# Patient Record
Sex: Female | Born: 1970
Health system: Southern US, Community
[De-identification: ages and names within clinical notes are randomized; demographics above are authoritative.]

## PROBLEM LIST (undated history)

## (undated) DIAGNOSIS — T4145XA Adverse effect of unspecified anesthetic, initial encounter: Secondary | ICD-10-CM

## (undated) DIAGNOSIS — T8859XA Other complications of anesthesia, initial encounter: Secondary | ICD-10-CM

## (undated) DIAGNOSIS — E7800 Pure hypercholesterolemia, unspecified: Secondary | ICD-10-CM

## (undated) DIAGNOSIS — G8929 Other chronic pain: Secondary | ICD-10-CM

## (undated) DIAGNOSIS — E039 Hypothyroidism, unspecified: Secondary | ICD-10-CM

## (undated) DIAGNOSIS — K219 Gastro-esophageal reflux disease without esophagitis: Secondary | ICD-10-CM

## (undated) DIAGNOSIS — N200 Calculus of kidney: Secondary | ICD-10-CM

## (undated) DIAGNOSIS — I509 Heart failure, unspecified: Secondary | ICD-10-CM

## (undated) DIAGNOSIS — G9050 Complex regional pain syndrome I, unspecified: Secondary | ICD-10-CM

## (undated) DIAGNOSIS — I639 Cerebral infarction, unspecified: Secondary | ICD-10-CM

## (undated) DIAGNOSIS — M810 Age-related osteoporosis without current pathological fracture: Secondary | ICD-10-CM

## (undated) DIAGNOSIS — M549 Dorsalgia, unspecified: Secondary | ICD-10-CM

## (undated) DIAGNOSIS — M542 Cervicalgia: Secondary | ICD-10-CM

## (undated) DIAGNOSIS — G2401 Drug induced subacute dyskinesia: Secondary | ICD-10-CM

## (undated) DIAGNOSIS — F419 Anxiety disorder, unspecified: Secondary | ICD-10-CM

## (undated) DIAGNOSIS — R51 Headache: Secondary | ICD-10-CM

## (undated) DIAGNOSIS — E079 Disorder of thyroid, unspecified: Secondary | ICD-10-CM

## (undated) DIAGNOSIS — R519 Headache, unspecified: Secondary | ICD-10-CM

## (undated) HISTORY — PX: SALPINGOOPHORECTOMY: SHX82

## (undated) HISTORY — DX: Age-related osteoporosis without current pathological fracture: M81.0

## (undated) HISTORY — PX: CHOLECYSTECTOMY: SHX55

## (undated) HISTORY — PX: ABDOMINAL HYSTERECTOMY: SHX81

## (undated) HISTORY — PX: BACK SURGERY: SHX140

## (undated) HISTORY — DX: Calculus of kidney: N20.0

## (undated) HISTORY — PX: STRABISMUS SURGERY: SHX218

## (undated) HISTORY — PX: KNEE ARTHROSCOPY: SUR90

## (undated) HISTORY — PX: CYST EXCISION: SHX5701

## (undated) HISTORY — DX: Drug induced subacute dyskinesia: G24.01

## (undated) HISTORY — PX: APPENDECTOMY: SHX54

## (undated) HISTORY — PX: NECK SURGERY: SHX720

## (undated) HISTORY — PX: BREAST SURGERY: SHX581

---
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
## 1999-02-13 ENCOUNTER — Ambulatory Visit (HOSPITAL_COMMUNITY): Admission: RE | Admit: 1999-02-13 | Discharge: 1999-02-13 | Payer: Self-pay | Admitting: Psychiatry

## 1999-02-16 ENCOUNTER — Ambulatory Visit: Admission: RE | Admit: 1999-02-16 | Discharge: 1999-02-16 | Payer: Self-pay | Admitting: Anesthesiology

## 2000-05-08 ENCOUNTER — Emergency Department (HOSPITAL_COMMUNITY): Admission: EM | Admit: 2000-05-08 | Discharge: 2000-05-08 | Payer: Self-pay | Admitting: Emergency Medicine

## 2000-07-07 ENCOUNTER — Encounter: Payer: Self-pay | Admitting: Family Medicine

## 2000-07-07 ENCOUNTER — Ambulatory Visit (HOSPITAL_COMMUNITY): Admission: RE | Admit: 2000-07-07 | Discharge: 2000-07-07 | Payer: Self-pay | Admitting: Family Medicine

## 2000-11-20 ENCOUNTER — Ambulatory Visit (HOSPITAL_COMMUNITY): Admission: RE | Admit: 2000-11-20 | Discharge: 2000-11-20 | Payer: Self-pay | Admitting: Family Medicine

## 2000-11-20 ENCOUNTER — Encounter: Payer: Self-pay | Admitting: Family Medicine

## 2001-09-27 ENCOUNTER — Emergency Department (HOSPITAL_COMMUNITY): Admission: EM | Admit: 2001-09-27 | Discharge: 2001-09-27 | Payer: Self-pay | Admitting: Emergency Medicine

## 2002-01-11 ENCOUNTER — Observation Stay (HOSPITAL_COMMUNITY): Admission: AD | Admit: 2002-01-11 | Discharge: 2002-01-12 | Payer: Self-pay | Admitting: Family Medicine

## 2002-01-11 ENCOUNTER — Encounter: Payer: Self-pay | Admitting: Family Medicine

## 2002-04-05 ENCOUNTER — Ambulatory Visit (HOSPITAL_COMMUNITY): Admission: RE | Admit: 2002-04-05 | Discharge: 2002-04-05 | Payer: Self-pay | Admitting: Family Medicine

## 2002-04-05 ENCOUNTER — Encounter: Payer: Self-pay | Admitting: Family Medicine

## 2002-06-20 ENCOUNTER — Encounter: Payer: Self-pay | Admitting: Internal Medicine

## 2002-06-20 ENCOUNTER — Emergency Department (HOSPITAL_COMMUNITY): Admission: EM | Admit: 2002-06-20 | Discharge: 2002-06-20 | Payer: Self-pay | Admitting: Internal Medicine

## 2002-06-22 ENCOUNTER — Encounter: Payer: Self-pay | Admitting: Internal Medicine

## 2002-06-22 ENCOUNTER — Ambulatory Visit (HOSPITAL_COMMUNITY): Admission: RE | Admit: 2002-06-22 | Discharge: 2002-06-22 | Payer: Self-pay | Admitting: Internal Medicine

## 2002-09-24 ENCOUNTER — Encounter: Admission: RE | Admit: 2002-09-24 | Discharge: 2002-09-24 | Payer: Self-pay | Admitting: Neurology

## 2002-09-24 ENCOUNTER — Encounter: Payer: Self-pay | Admitting: Neurology

## 2002-11-26 ENCOUNTER — Encounter: Payer: Self-pay | Admitting: Emergency Medicine

## 2002-11-26 ENCOUNTER — Emergency Department (HOSPITAL_COMMUNITY): Admission: EM | Admit: 2002-11-26 | Discharge: 2002-11-26 | Payer: Self-pay | Admitting: Emergency Medicine

## 2002-12-02 ENCOUNTER — Encounter: Payer: Self-pay | Admitting: Emergency Medicine

## 2002-12-02 ENCOUNTER — Emergency Department (HOSPITAL_COMMUNITY): Admission: EM | Admit: 2002-12-02 | Discharge: 2002-12-02 | Payer: Self-pay | Admitting: Emergency Medicine

## 2002-12-24 ENCOUNTER — Encounter: Payer: Self-pay | Admitting: Emergency Medicine

## 2002-12-24 ENCOUNTER — Emergency Department (HOSPITAL_COMMUNITY): Admission: EM | Admit: 2002-12-24 | Discharge: 2002-12-24 | Payer: Self-pay | Admitting: Emergency Medicine

## 2003-01-16 ENCOUNTER — Emergency Department (HOSPITAL_COMMUNITY): Admission: AD | Admit: 2003-01-16 | Discharge: 2003-01-16 | Payer: Self-pay | Admitting: Family Medicine

## 2003-05-24 ENCOUNTER — Ambulatory Visit (HOSPITAL_COMMUNITY): Admission: RE | Admit: 2003-05-24 | Discharge: 2003-05-24 | Payer: Self-pay | Admitting: Family Medicine

## 2003-07-31 ENCOUNTER — Emergency Department (HOSPITAL_COMMUNITY): Admission: EM | Admit: 2003-07-31 | Discharge: 2003-07-31 | Payer: Self-pay | Admitting: Emergency Medicine

## 2003-10-12 ENCOUNTER — Ambulatory Visit (HOSPITAL_COMMUNITY): Admission: RE | Admit: 2003-10-12 | Discharge: 2003-10-12 | Payer: Self-pay | Admitting: Family Medicine

## 2003-10-23 ENCOUNTER — Emergency Department (HOSPITAL_COMMUNITY): Admission: EM | Admit: 2003-10-23 | Discharge: 2003-10-23 | Payer: Self-pay | Admitting: Emergency Medicine

## 2003-12-20 ENCOUNTER — Other Ambulatory Visit: Admission: RE | Admit: 2003-12-20 | Discharge: 2003-12-20 | Payer: Self-pay | Admitting: Dermatology

## 2004-01-09 ENCOUNTER — Emergency Department (HOSPITAL_COMMUNITY): Admission: EM | Admit: 2004-01-09 | Discharge: 2004-01-10 | Payer: Self-pay | Admitting: *Deleted

## 2005-01-06 ENCOUNTER — Emergency Department (HOSPITAL_COMMUNITY): Admission: EM | Admit: 2005-01-06 | Discharge: 2005-01-06 | Payer: Self-pay | Admitting: Emergency Medicine

## 2005-04-14 ENCOUNTER — Emergency Department (HOSPITAL_COMMUNITY): Admission: EM | Admit: 2005-04-14 | Discharge: 2005-04-15 | Payer: Self-pay | Admitting: Emergency Medicine

## 2005-04-29 ENCOUNTER — Ambulatory Visit (HOSPITAL_COMMUNITY): Admission: RE | Admit: 2005-04-29 | Discharge: 2005-04-29 | Payer: Self-pay | Admitting: Family Medicine

## 2005-05-01 ENCOUNTER — Encounter (HOSPITAL_COMMUNITY): Admission: RE | Admit: 2005-05-01 | Discharge: 2005-05-31 | Payer: Self-pay | Admitting: Family Medicine

## 2005-05-17 ENCOUNTER — Encounter (INDEPENDENT_AMBULATORY_CARE_PROVIDER_SITE_OTHER): Payer: Self-pay | Admitting: General Surgery

## 2005-05-17 ENCOUNTER — Ambulatory Visit (HOSPITAL_COMMUNITY): Admission: RE | Admit: 2005-05-17 | Discharge: 2005-05-17 | Payer: Self-pay | Admitting: General Surgery

## 2005-10-11 ENCOUNTER — Ambulatory Visit (HOSPITAL_COMMUNITY): Admission: RE | Admit: 2005-10-11 | Discharge: 2005-10-11 | Payer: Self-pay | Admitting: Family Medicine

## 2006-02-26 ENCOUNTER — Ambulatory Visit (HOSPITAL_COMMUNITY): Admission: RE | Admit: 2006-02-26 | Discharge: 2006-02-26 | Payer: Self-pay | Admitting: Family Medicine

## 2006-06-24 ENCOUNTER — Encounter: Admission: RE | Admit: 2006-06-24 | Discharge: 2006-06-24 | Payer: Self-pay | Admitting: Orthopedic Surgery

## 2006-08-28 ENCOUNTER — Ambulatory Visit (HOSPITAL_COMMUNITY)
Admission: RE | Admit: 2006-08-28 | Discharge: 2006-08-28 | Payer: Self-pay | Admitting: Physical Medicine and Rehabilitation

## 2006-09-09 ENCOUNTER — Emergency Department (HOSPITAL_COMMUNITY): Admission: EM | Admit: 2006-09-09 | Discharge: 2006-09-09 | Payer: Self-pay | Admitting: Emergency Medicine

## 2006-09-10 ENCOUNTER — Ambulatory Visit (HOSPITAL_COMMUNITY): Admission: RE | Admit: 2006-09-10 | Discharge: 2006-09-10 | Payer: Self-pay | Admitting: Family Medicine

## 2006-09-22 ENCOUNTER — Ambulatory Visit (HOSPITAL_COMMUNITY): Admission: RE | Admit: 2006-09-22 | Discharge: 2006-09-22 | Payer: Self-pay | Admitting: Family Medicine

## 2006-11-12 ENCOUNTER — Emergency Department (HOSPITAL_COMMUNITY): Admission: EM | Admit: 2006-11-12 | Discharge: 2006-11-12 | Payer: Self-pay | Admitting: Emergency Medicine

## 2008-01-21 ENCOUNTER — Ambulatory Visit (HOSPITAL_COMMUNITY): Admission: RE | Admit: 2008-01-21 | Discharge: 2008-01-21 | Payer: Self-pay | Admitting: Family Medicine

## 2008-07-28 ENCOUNTER — Ambulatory Visit (HOSPITAL_COMMUNITY): Admission: RE | Admit: 2008-07-28 | Discharge: 2008-07-28 | Payer: Self-pay | Admitting: Neurosurgery

## 2008-08-23 ENCOUNTER — Emergency Department (HOSPITAL_COMMUNITY): Admission: EM | Admit: 2008-08-23 | Discharge: 2008-08-23 | Payer: Self-pay | Admitting: Emergency Medicine

## 2009-03-13 ENCOUNTER — Encounter: Admission: RE | Admit: 2009-03-13 | Discharge: 2009-03-13 | Payer: Self-pay | Admitting: Neurosurgery

## 2009-04-29 ENCOUNTER — Encounter: Admission: RE | Admit: 2009-04-29 | Discharge: 2009-04-29 | Payer: Self-pay | Admitting: Neurosurgery

## 2009-05-08 ENCOUNTER — Encounter (HOSPITAL_COMMUNITY): Admission: RE | Admit: 2009-05-08 | Discharge: 2009-06-07 | Payer: Self-pay | Admitting: Family Medicine

## 2009-05-23 ENCOUNTER — Encounter: Admission: RE | Admit: 2009-05-23 | Discharge: 2009-05-23 | Payer: Self-pay | Admitting: Neurosurgery

## 2009-07-03 ENCOUNTER — Ambulatory Visit: Payer: Self-pay | Admitting: Vascular Surgery

## 2009-07-03 ENCOUNTER — Inpatient Hospital Stay (HOSPITAL_COMMUNITY): Admission: RE | Admit: 2009-07-03 | Discharge: 2009-07-06 | Payer: Self-pay | Admitting: Neurosurgery

## 2010-02-19 ENCOUNTER — Ambulatory Visit (HOSPITAL_COMMUNITY): Admission: RE | Admit: 2010-02-19 | Discharge: 2010-02-19 | Payer: Self-pay | Admitting: Internal Medicine

## 2010-03-29 ENCOUNTER — Ambulatory Visit (HOSPITAL_COMMUNITY)
Admission: RE | Admit: 2010-03-29 | Discharge: 2010-03-29 | Payer: Self-pay | Source: Home / Self Care | Attending: Endocrinology | Admitting: Endocrinology

## 2010-06-08 ENCOUNTER — Other Ambulatory Visit (HOSPITAL_COMMUNITY): Payer: Self-pay | Admitting: Internal Medicine

## 2010-06-08 DIAGNOSIS — R1110 Vomiting, unspecified: Secondary | ICD-10-CM

## 2010-06-13 ENCOUNTER — Ambulatory Visit (HOSPITAL_COMMUNITY): Payer: Medicare Other

## 2010-06-19 ENCOUNTER — Ambulatory Visit (HOSPITAL_COMMUNITY): Payer: Medicare Other | Attending: Internal Medicine

## 2010-06-20 LAB — CBC
HCT: 31.6 % — ABNORMAL LOW (ref 36.0–46.0)
HCT: 32.4 % — ABNORMAL LOW (ref 36.0–46.0)
Hemoglobin: 11 g/dL — ABNORMAL LOW (ref 12.0–15.0)
MCHC: 33.8 g/dL (ref 30.0–36.0)
MCHC: 34.1 g/dL (ref 30.0–36.0)
Platelets: 206 10*3/uL (ref 150–400)
Platelets: 212 10*3/uL (ref 150–400)
WBC: 13.6 10*3/uL — ABNORMAL HIGH (ref 4.0–10.5)

## 2010-06-20 LAB — BASIC METABOLIC PANEL
BUN: 1 mg/dL — ABNORMAL LOW (ref 6–23)
CO2: 26 mEq/L (ref 19–32)
Calcium: 7.4 mg/dL — ABNORMAL LOW (ref 8.4–10.5)
Calcium: 7.6 mg/dL — ABNORMAL LOW (ref 8.4–10.5)
Chloride: 103 mEq/L (ref 96–112)
Creatinine, Ser: 0.64 mg/dL (ref 0.4–1.2)
GFR calc non Af Amer: >60 mL/min (ref >60)
GFR calc non Af Amer: >60 mL/min (ref >60)
Potassium: 3.8 mEq/L (ref 3.5–5.1)

## 2010-06-22 LAB — CBC
HCT: 40 % (ref 36.0–46.0)
WBC: 11.1 10*3/uL — ABNORMAL HIGH (ref 4.0–10.5)

## 2010-06-22 LAB — TYPE AND SCREEN

## 2010-06-22 LAB — SURGICAL PCR SCREEN: Staphylococcus aureus: POSITIVE — AB

## 2010-07-10 LAB — BASIC METABOLIC PANEL
BUN: 7 mg/dL (ref 6–23)
Calcium: 9.3 mg/dL (ref 8.4–10.5)
Creatinine, Ser: 0.73 mg/dL (ref 0.4–1.2)
GFR calc Af Amer: >60 mL/min (ref >60)
GFR calc non Af Amer: >60 mL/min (ref >60)
Glucose, Bld: 118 mg/dL — ABNORMAL HIGH (ref 70–99)
Sodium: 140 mEq/L (ref 135–145)

## 2010-07-10 LAB — DIFFERENTIAL
Basophils Relative: 1 % (ref 0–1)
Eosinophils Absolute: 0.2 10*3/uL (ref 0.0–0.7)
Eosinophils Relative: 2 % (ref 0–5)
Lymphs Abs: 4 10*3/uL (ref 0.7–4.0)
Neutrophils Relative %: 55 % (ref 43–77)

## 2010-07-10 LAB — CBC
Platelets: 235 10*3/uL (ref 150–400)
RBC: 4.3 MIL/uL (ref 3.87–5.11)

## 2010-07-11 LAB — CBC
MCV: 94.8 fL (ref 78.0–100.0)
Platelets: 253 10*3/uL (ref 150–400)
WBC: 11.6 10*3/uL — ABNORMAL HIGH (ref 4.0–10.5)

## 2010-08-14 NOTE — Op Note (Signed)
NAMEESRA, [name]             ACCOUNT NO.:  [id number]   MEDICAL RECORD NO.:  [id number]          PATIENT TYPE:  OIB   LOCATION:  3537                         FACILITY:  [facility]   PHYSICIAN:  Cristi Loron, M.D.DATE OF BIRTH:  Apr 30, 1970   DATE OF PROCEDURE:  07/28/2008  DATE OF DISCHARGE:  07/28/2008                               OPERATIVE REPORT   BRIEF HISTORY:  The patient is a 40 year old white female who has  suffered from neck and left arm pain consistent with a left C7  radiculopathy.  She failed medical management, worked up with a cervical  MRI which demonstrated large herniated disk at C7 on the left.  I  discussed the various treatment options with the patient including  surgery.  She has weighed the risks, benefits, and alternatives of  proceed and decided to proceed with C6-7 anterior cervical diskectomy,  fusion, and plating.   PREOPERATIVE STENOSES:  C6-7 herniated nucleus pulposus, spinal  stenosis, cervical radiculopathy, and cervicalgia.   POSTOPERATIVE DIAGNOSES:  C6-7 herniated nucleus pulposus, spinal  stenosis, cervical radiculopathy, and cervicalgia.   PROCEDURE:  C6-7 extensive anterior cervical diskectomy/decompression;  C7 anterior interbody arthrodesis with local morselized autograft bone  and Actifuse bone graft extender; insertion of C7 interbody prosthesis  (Novel PEEK interbody prosthesis); C6-7 anterior cervical  instrumentation with Codman Slim-Loc titanium plate and screws.   SURGEON:  Cristi Loron, MD   ASSISTANT:  Hilda Lias, MD   ANESTHESIA:  General endotracheal.   ESTIMATED BLOOD LOSS:  50 mL.   SPECIMENS:  None.   DRAINS:  None.   COMPLICATIONS:  None.   PROCEDURE:  The patient was brought to the operating room by the  anesthesia team.  General endotracheal anesthesia was induced.  The  patient remained in the supine position.  A roll was placed under her  shoulders and placed her neck in slight extension.  Her  anterior  cervical region was then prepared with Betadine scrub and Betadine  solution.  Sterile drapes were applied.  I injected the area to be  incised with Marcaine with epinephrine solution.  I used a scalpel to  make a transverse incision in the patient's left anterior neck.  I used  Metzenbaum scissors to divide the platysma muscle and then to dissect  medial to sternocleidomastoid muscle, jugular vein, and carotid artery.  We then identified the esophagus and retracted medially.  I then used  the Kittner swabs to clear soft tissue from the anterior cervical spine.  We then inserted a bent spinal needle into the upper exposed  intervertebral disk space.  We obtained intraoperative radiograph to  confirm our location.   We then used electrocautery to detach the medial border of the longus  colli muscle bilaterally from C6-7 and intervertebral space.  We then  inserted Caspar self-retaining retractors underneath the longus colli  muscle bilaterally to provide exposure.  We incised C6-7 intervertebral  disk with a 15 blade scalpel and performed a partial intervertebral  diskectomy using the pituitary forceps and Karlin curettes.  We then  inserted distraction screws to C6 and C7, distracted the  interspace, and  then used a high-speed drill to decorticate the vertebral endplates of  the A5-4, drilled away the remainder of C6-7 intervertebral disk to thin  out the posterior longitudinal ligament and drilled away some posterior  spondylosis.  We then incised the ligament with arachnoid knife.  I then  removed it with a Kerrison punch undercutting the vertebral endplates of  C7 decompressing the thecal sac and then performed foraminotomies about  the bilateral C7 nerve root completing the decompression.  Of note, we  did encounter a herniated disk on the left as expected.  We removed it  with a Kerrison punch and pituitary forceps.   Having completed the decompression, we now turned our  attention to  arthrodesis.  We used trial spacers and determined to use a 5-mm medium  PEEK interbody prosthesis.  We prefilled this prosthesis with a  combination of local morselized autograft bone.  We obtained during the  decompression as well as Actifuse bone graft extender.  We inserted the  tissues in the interspace, distracted the interspace, and then removed  distraction screws.  There was good snug fit of prosthesis in  interspace.  This completed the arthrodesis and insertion of the  prosthesis.   We now turned our attention to the anterior spinal instrumentation.  We  used a high-speed drill to remove some ventral spondylosis from the  vertebral endplates at C6 and C7 so that plate would lay down flat.  We  selected the appropriate length Codman Slim-Loc anterior cervical plate.  We laid it along the anterior aspect of the vertebral bodies at C6 and  C7.  We then drilled two 12-mm  holes at C6-C7 and then secured the  plate to the vertebral body placing two 12-mm self-tapping screws at C6-  C7.  We then obtained intraoperative radiograph.  There was limited  visualization of the construct because of the patient's shoulders, but  they looked good in vivo.  We therefore secured the screws and plate by  locking each cam.  This completed the instrumentation.   We then obtained hemostasis using electrocautery.  We irrigated the  wound with bacitracin solution.  We removed the retractors.  We  inspected esophagus for any damages, none apparent.  We then  reapproximated the patient's platysma muscle with interrupted 3-0 Vicryl  suture, subcutaneous tissue with interrupted 3-0 Vicryl suture, and then  the skin with Steri-Strips and benzoin.  The wound was then coated with  bacitracin ointment.  A sterile dressing was applied.  The drapes were  removed.  The patient was subsequently extubated by the anesthesia team  and transported to the Postanesthesia Care Unit in stable  condition.  All sponge, instrument, and needle counts were correct at the end of the  case.      Cristi Loron, M.D.  Electronically Signed     JDJ/MEDQ  D:  07/28/2008  T:  07/29/2008  Job:  [id number]

## 2010-08-17 NOTE — Discharge Summary (Signed)
   NAME:  [name], [name]                        ACCOUNT NO.:  [id number]   MEDICAL RECORD NO.:  [id number]                   PATIENT TYPE:  INP   LOCATION:  A224                                 FACILITY:  [facility]   PHYSICIAN:  Corrie Mckusick, M.D.               DATE OF BIRTH:  [birth year]   DATE OF ADMISSION:  01/11/2002  DATE OF DISCHARGE:  01/12/2002                                 DISCHARGE SUMMARY   HISTORY AND PRESENTING ILLNESS AND PAST MEDICAL HISTORY:  Please see  admission H&P.   HOSPITAL COURSE:  A 40 year old female with atypical chest pain, probably  bronchitic in nature who is admitted for rule out myocardial infarction.  All CPKs, troponin, and MB fractions were negative x3.  ECG and telemetry  remained normal.  Dr. Domingo Sep saw the patient and felt like this was  atypical as well.  She did rule out and was ready for discharge.  She had  had no chest pain in the hospital and was having quite a bit of improved  respiratory status after nebulizer treatments as well.  Remained afebrile.   PHYSICAL EXAMINATION:  VITAL SIGNS:  Afebirle.  Vital signs stable.  GENERAL:  Pleasant female looking quite well, ready for discharge.  HEENT:  Naso and oropharynx are clear.  CHEST:  Clear to auscultation bilaterally with just some mild upper rhonchi.  CARDIOVASCULAR:  Regular rate and rhythm.  No murmurs.   DISCHARGE MEDICATIONS:  1. Levaquin 500 mg p.o. daily for 10 days.  2. Combivent two puffs p.o. q.i.d.   FOLLOW UP:  Follow up with [facility] in one week or sooner if need be.  Follow  up with Dr. Domingo Sep in one to two weeks for outpatient stress test.   CONDITION ON DISCHARGE:  Improved and stable.   Also discussed with the patient to take a coated baby aspirin daily.                                               Corrie Mckusick, M.D.    JCG/MEDQ  D:  01/12/2002  T:  01/13/2002  Job:  [id number]

## 2010-08-17 NOTE — Op Note (Signed)
NAME:  [name], [name]              ACCOUNT NO.:  [id number]   MEDICAL RECORD NO.:  [id number]          PATIENT TYPE:  AMB   LOCATION:  DAY                           FACILITY:  [facility]   PHYSICIAN:  Dalia Heading, M.D.  DATE OF BIRTH:  March 22, 1971   DATE OF PROCEDURE:  05/17/2005  DATE OF DISCHARGE:                                 OPERATIVE REPORT   PREOPERATIVE DIAGNOSIS:  Chronic cholecystitis   POSTOPERATIVE DIAGNOSIS:  Chronic cholecystitis, incidental appendectomy.   PROCEDURE:  Laparoscopic cholecystectomy, incidental laparoscopic  appendectomy.   SURGEON:  Dr. Franky Macho.   ASSISTANT:  Dr. Arna Snipe.   ANESTHESIA:  General endotracheal.   INDICATIONS:  The patient is a 40 year old white female who presents with  biliary colic secondary to chronic cholecystitis. She is also requested to  have an incidental appendectomy. The risks and benefits of both procedures  including bleeding, infection, pain, hepatobiliary injury and the  possibility of an open procedure were fully explained to the patient, who  gave informed consent.   PROCEDURE NOTE:  The patient was placed in a supine position. After  induction of general endotracheal anesthesia, the abdomen was prepped and  draped using the usual sterile technique with Betadine. Surgical site  confirmation was performed.   An infraumbilical incision was made down to the fascia. Veress needle was  introduced into the abdominal cavity, and confirmation of placement done  using the saline drop test. The abdomen was then insufflated to 16-mmHg  pressure. An 11-mm trocar was introduced into the abdominal cavity under  direct visualization without difficulty. The patient was placed in reversed  Trendelenburg position. An additional 11-mm trocar was placed epigastric  region, and 5-mm trocars were placed in the right upper quadrant and right  flank regions. Liver was inspected and noted to be within normal limits. The  gallbladder was retracted superiorly and laterally. The dissection was begun  around the infundibulum of the gallbladder. Cystic duct was first  identified. Its juncture to the infundibulum fully identified. Endoclips  were placed proximally and distally on cystic duct, and cystic duct was  divided. This was likewise done to the cystic artery. The gallbladder was  then freed away from the gallbladder fossa using Bovie electrocautery. The  gallbladder was delivered through the epigastric trocar site using an  EndoCatch bag. Gallbladder fossa was inspected. No abnormal bleeding or bile  leakage was noted. Surgicel was placed in the gallbladder fossa.   Next, we proceeded with the laparoscopic appendectomy. The patient was  placed in Trendelenburg position. An additional 12-mm trocar was placed in  the suprapubic region, and 5-mm trocar was placed in the left lower quadrant  region. The appendix was visualized and noted to be within normal limits.  The mesoappendix was divided using the harmonic scalpel. Vascular Endo-GIA  was placed across the base the appendix and fired. The appendix was removed  using an EndoCatch bag. The appendiceal stump was inspected, and no bleeding  was noted at the end of procedure. All fluid and air were then evacuated  from the abdominal cavity prior to removal of  the trocars.   All wounds were irrigated with normal saline. All wounds were injected with  0.5% Sensorcaine. The infraumbilical fascia as well as suprapubic fascia  were reapproximated using 0 Vicryl interrupted sutures. All skin incisions  were closed using staples. Betadine ointment and dry sterile dressings were  applied.   All tape and needle counts were correct at the end of the procedure. The  patient was extubated in the operating room and went back to recovery room  awake in stable condition.   COMPLICATIONS:  None.   SPECIMEN:  Gallbladder, appendix.   BLOOD LOSS:  Minimal.       Dalia Heading, M.D.  Electronically Signed     MAJ/MEDQ  D:  05/17/2005  T:  05/17/2005  Job:  [id number]   cc:   Corrie Mckusick, M.D.  Fax: [phone]

## 2010-08-17 NOTE — H&P (Signed)
NAME:  ALLYNA, PITTSLEY                        ACCOUNT NO.:  [id number]   MEDICAL RECORD NO.:  [id number]                   PATIENT TYPE:  INP   LOCATION:  A224                                 FACILITY:  [facility]   PHYSICIAN:  Corrie Mckusick, M.D.               DATE OF BIRTH:  10/26/1970   DATE OF ADMISSION:  01/11/2002  DATE OF DISCHARGE:                                HISTORY & PHYSICAL   PRIMARY CARE PHYSICIAN:  [facility]   ADMITTING DIAGNOSIS:  Atypical chest pain.   HISTORY OF PRESENT ILLNESS:  A 40 year old female with a history of anxiety  and panic attacks and palpitations who presented with 2 to 3 days of left-  sided chest pain.  There was quite a bit of heaviness in the left side of  chest, some associated shortness of breath, some radiating to the left  shoulder.  She also had associated nausea and vomiting and dizziness, some  cough, and mild shortness of breath.  She is a smoker.  History of  palpitations.  Negative family history of coronary disease.   PAST MEDICAL HISTORY:  1. Anxiety and panic attacks.  2. History of palpitations.   PAST SURGICAL HISTORY:  Eye surgery in 1975, DTL, otherwise negative.   MEDICATIONS ON ADMISSION:  Serzone 150 mg b.i.d.   ALLERGIES:  No known drug allergies.   SOCIAL HISTORY:  Smokes a pack a day.  No illicit substance abuse.   VITALS ON ADMISSION:  Afebrile, pulse 72, respirations 18, blood pressure  100/70, weight 158.   PHYSICAL EXAMINATION:  When I saw the patient she was pleasant and  talkative, in no acute distress, no complaints of chest pain.  HEENT:  Normocephalic, atraumatic.  Pupils equally round and reactive to  light, extraocular movements intact, nasal and oropharynx clear.  NECK:  Supple, no thyromegaly.  CHEST:  Mild upper rhonchi bilaterally, no evidence of decreased breath  sounds, no rhonchi, no rales.  CARDIOVASCULAR:  regular rate and rhythm, normal S1, S2, no S3, S4, no  gallops or rubs.  ABDOMEN:  Soft, nontender.  EXTREMITIES:  No cyanosis, clubbing or pedal edema.   ADMISSION LABORATORIES:  ECG showed normal sinus rhythm, normal axes, no  __________  ,  no acute ST changes.   ASSESSMENT:  A 40 year old female with atypical chest pain.   PLAN:  1. Admit to rule out myocardial infarction.  2. CK, troponin, and MD fractions q.8h. X3.  3. ECG on admission and q.d.  4. Temperature on admission.  5. CBC, chem 12 on admission.  6. Fasting lipids in a.m.  7. Nitroglycerine and aspirin 325 given in the office.  8. Continue to monitor closely and continue home medications.  9. Dr. Domingo Sep to further risk stratify.  Corrie Mckusick, M.D.    JCG/MEDQ  D:  01/11/2002  T:  01/11/2002  Job:  [id number]

## 2010-08-17 NOTE — H&P (Signed)
NAME:  [name], MONTERO              ACCOUNT NO.:  [id number]   MEDICAL RECORD NO.:  [id number]          PATIENT TYPE:  AMB   LOCATION:  DAY                           FACILITY:  [facility]   PHYSICIAN:  Dalia Heading, M.D.  DATE OF BIRTH:  [birth year]   DATE OF ADMISSION:  DATE OF DISCHARGE:  LH                                HISTORY & PHYSICAL   CHIEF COMPLAINT:  Chronic cholecystitis.   HISTORY OF PRESENT ILLNESS:  The patient is a 40 year old white female who  was referred for evaluation and treatment of biliary colic secondary to  chronic cholecystitis.  She has been having right upper quadrant abdominal  pain with radiation to the right flank, nausea, and bloating for many  months.  She does have fatty food intolerance.  No fever, chills, or  jaundice noted.   PAST MEDICAL HISTORY:  Includes depression.   PAST SURGICAL HISTORY:  1.  Hysterectomy.  2.  Fibrocystic tumor of the breast.  3.  Eye surgery.  4.  Tubal ligation.   CURRENT MEDICATIONS:  Lexapro, Champix 1 mg p.o. twice daily.   ALLERGIES:  PERCOCET.   REVIEW OF SYSTEMS:  The patient smokes daily.  She denies alcohol use.  She  denies any other cardiopulmonary difficulties or bleeding disorders.   PHYSICAL EXAMINATION:  GENERAL:  The patient is a well-developed, well-  nourished white female in no acute distress.  HEENT:  Examination reveals no scleral icterus.  LUNGS: Clear to auscultation with equal breath sounds bilaterally.  HEART: Regular rate and rhythm without S3, S4, or murmurs.  ABDOMEN:  Soft and nondistended. She is tender in the right upper quadrant  to palpation.  No hepatosplenomegaly, masses, or hernias are identified.   Ultrasound of the gallbladder is unremarkable.   Hepatobiliary scan reveals chronic cholecystitis with a low gallbladder  ejection fraction and reproducible symptoms.   IMPRESSION:  Chronic cholecystitis.   PLAN:  The patient is scheduled for laparoscopic cholecystectomy on  May 17, 2005.  The risks and benefits of the procedure including bleeding,  infection, hepatobiliary injury, and the possibility of an open procedure  were fully explained to the patient, gave informed consent.      Dalia Heading, M.D.  Electronically Signed     MAJ/MEDQ  D:  05/14/2005  T:  05/14/2005  Job:  [id number]   cc:   [facility]  Fax: [phone]   Corrie Mckusick, M.D.  Fax: [phone]

## 2010-10-02 ENCOUNTER — Other Ambulatory Visit: Payer: Self-pay | Admitting: Neurosurgery

## 2010-10-02 DIAGNOSIS — M792 Neuralgia and neuritis, unspecified: Secondary | ICD-10-CM

## 2010-10-02 DIAGNOSIS — M542 Cervicalgia: Secondary | ICD-10-CM

## 2010-10-09 ENCOUNTER — Ambulatory Visit
Admission: RE | Admit: 2010-10-09 | Discharge: 2010-10-09 | Disposition: A | Discharge status: Home / Self Care | Payer: Medicare Other | Source: Ambulatory Visit | Attending: Neurosurgery | Admitting: Neurosurgery

## 2010-10-09 DIAGNOSIS — M542 Cervicalgia: Secondary | ICD-10-CM

## 2010-10-09 DIAGNOSIS — M792 Neuralgia and neuritis, unspecified: Secondary | ICD-10-CM

## 2010-10-19 ENCOUNTER — Encounter (HOSPITAL_COMMUNITY)
Admission: RE | Admit: 2010-10-19 | Discharge: 2010-10-19 | Disposition: A | Discharge status: Home / Self Care | Payer: Medicare Other | Source: Ambulatory Visit | Attending: Neurosurgery | Admitting: Neurosurgery

## 2010-10-19 LAB — CBC
HCT: 40.7 % (ref 36.0–46.0)
Platelets: 251 10*3/uL (ref 150–400)
RBC: 4.34 MIL/uL (ref 3.87–5.11)
RDW: 13.6 % (ref 11.5–15.5)
WBC: 9.3 10*3/uL (ref 4.0–10.5)

## 2010-10-19 LAB — SURGICAL PCR SCREEN: Staphylococcus aureus: NEGATIVE

## 2010-10-22 ENCOUNTER — Inpatient Hospital Stay (HOSPITAL_COMMUNITY)
Admission: RE | Admit: 2010-10-22 | Discharge: 2010-10-22 | DRG: 473 | Disposition: A | Discharge status: Home / Self Care | Payer: Medicare Other | Source: Ambulatory Visit | Attending: Neurosurgery | Admitting: Neurosurgery

## 2010-10-22 ENCOUNTER — Ambulatory Visit (HOSPITAL_COMMUNITY): Payer: Medicare Other

## 2010-10-22 DIAGNOSIS — M5000 Cervical disc disorder with myelopathy, unspecified cervical region: Principal | ICD-10-CM | POA: Diagnosis present

## 2010-10-22 DIAGNOSIS — K219 Gastro-esophageal reflux disease without esophagitis: Secondary | ICD-10-CM | POA: Diagnosis present

## 2010-10-22 DIAGNOSIS — F17200 Nicotine dependence, unspecified, uncomplicated: Secondary | ICD-10-CM | POA: Diagnosis present

## 2010-10-22 DIAGNOSIS — Z981 Arthrodesis status: Secondary | ICD-10-CM

## 2010-10-22 DIAGNOSIS — Z01812 Encounter for preprocedural laboratory examination: Secondary | ICD-10-CM

## 2010-10-23 NOTE — Op Note (Signed)
[name], [name] NO.:  [id number]  MEDICAL RECORD NO.:  [id number]  LOCATION:  3526                         FACILITY:  [facility]  PHYSICIAN:  Cristi Loron, M.D.DATE OF BIRTH:  29-Mar-1971  DATE OF PROCEDURE:  10/22/2010 DATE OF DISCHARGE:  10/22/2010                              OPERATIVE REPORT   BRIEF HISTORY:  The patient is a 40 year old white female who has previously undergone a C6-7 anterior cervical diskectomy, fusion, and plating years ago.  She has done well except for some chronic neck pain. She has now developed severe left arm pain, numbness, and tingling consistent with the C6 radiculopathy.  She has failed medical management and was worked up with cervical MRI which demonstrated the patient had a herniated disk at C5-6.  I discussed the various treatment options with the patient including surgery.  The patient has weighed the risks, benefits, and alternatives of surgery and decided to proceed with the exploration of cervical fusion with the C5-6 anterior cervical diskectomy, fusion, and plating.  PREOPERATIVE DIAGNOSES:  C5-6 herniated nucleus pulposus, cervical spinal stenosis, cervical radiculopathy/myelopathy, and cervicalgia.  POSTOPERATIVE DIAGNOSES:  C5-6 herniated nucleus pulposus, cervical spinal stenosis, cervical radiculopathy/myelopathy, and cervicalgia.  PROCEDURES:  Exploration of cervical fusion; C5-6 extensive anterior cervical diskectomy/decompression; C5-6 anterior interbody arthrodesis with local morselized autograft bone and Actifuse bone graft extender; insertion of interbody prosthesis at C5-6 (Zimmer PEEK interbody prosthesis); C5-6 anterior cervical plating with Globus titanium plate and screws.  SURGEON:  Cristi Loron, MD  ASSISTANT:  Coletta Memos, MD  ANESTHESIA:  General endotracheal.  ESTIMATED BLOOD LOSS:  50 mL.  SPECIMENS:  None.  DRAINS:  None.  COMPLICATIONS:  None.  DESCRIPTION OF  PROCEDURE:  The patient was brought to the operating room by the Anesthesia Team.  General endotracheal anesthesia was induced. The patient remained in the supine position.  A roll was placed under her shoulders to keep her neck in a neutral position.  Her anterior cervical region was then prepared with Betadine scrub and Betadine solution.  Sterile drapes were applied.  I then injected the area to be incised with Marcaine with epinephrine solution.  I used a scalpel to make a transverse incision in the patient's left anterior neck.  I used the Metzenbaum scissors to divide the platysma muscle and then to dissect medial to sternocleidomastoid muscle, jugular vein, and carotid artery.  I carefully dissected down towards the anterior cervical spine dissecting through scar tissue from the operation and identifying the esophagus.  We retracted the esophagus medially with the handheld retractors.  I then used skin swabs to clear soft tissue from the anterior cervical spine and to expose the old intracervical plate.  I used a #15 blade scalpel to clear some scar tissue off over the anterior cervical plate.  We then locked the cams, removed the screws, and removed the plate.  I inspected the fusion at C6-7.  It did appear to be solid.  We now turned our attention to decompression of C5-6.  I used electrocautery to detach the medial border of the longus colli muscle bilaterally from C5-6 intervertebral space.  I inserted the Caspar self- retaining retractor underneath the longus  colli muscle bilaterally to provide exposure.  I then began decompression by incising the C5-6 intervertebral disk with a #15 blade scalpel.  We performed partial intervertebral diskectomy with the pituitary forceps.  We inserted distraction screws at C5 and C6, distracted the interspace, and then used the high-speed drill to decorticate the vertebral endplates at C5- 6, to drill away the remainder of C5-6  intervertebral disk, to drill away some posterior spondylosis, and to thin out the posterior longitudinal ligament.  We then incised the ligament with arachnoid knife and then removed it with Kerrison punch undercutting the vertebral endplates at C5-6 decompressing the thecal sac.  We then performed foraminotomies about the C6 nerve root completing the decompression.  Of note, we did encounter a large hernia disk on the left which was compressing left C6 nerve and we removed it with Kerrison punches and pituitary forceps.  Having completed the decompression, we now turned our attention to arthrodesis.  We used trial spacers and determined to use a 7-mm interbody prosthesis.  We prefilled the prosthesis with a combination of local autograft bone we had obtained during the decompression as well as Actifuse bone graft extender.  We inserted the prefilled prosthesis at the C5-6 interspace and then removed the distraction screws.  There was a good snug fit of the prosthesis in the interspace.  We now turned our attention to the anterior spinal instrumentation.  I used high-speed drill to remove some ventral spondylosis from the vertebral endplates of C5 and C6 so that the plate would lay down flat. We selected the appropriate length Globus titanium anterior plate.  We laid it along the anterior aspect of the vertebral bodies of C5-C6.  We used the old screw holes at C6 and placed two 12-mm self-tapping screws through the old screw holes.  At C5, we drilled two new 12-mm holes and secured the plate with two 16-XW self-tapping screws.  At this level, we got good bony purchase.  We then obtained intraoperative radiograph which demonstrated good position of plate, screws, and bioprosthesis. We then secured the screws and plate by locking each cam.  This completed the instrumentation.  We then obtained hemostasis using bipolar electrocautery.  We irrigated the wound out with bacitracin  solution. We then removed the retractor. We inspected the esophagus for any damage and there was none apparent. We then reapproximated the patient's platysma muscle with interrupted 3- 0 Vicryl suture, subcutaneous tissue with interrupted 3-0 Vicryl suture, and the skin with Steri-Strips and benzoin.  I should also mention that we did encounter a small artery crossing right across the C5-6 disk space.  We ligated with hemoclips and electrocautery.    Cristi Loron, M.D.    JDJ/MEDQ  D:  10/22/2010  T:  10/23/2010  Job:  [id number]  Electronically Signed by Tressie Stalker M.D. on 10/23/2010 06:27:55 PM

## 2010-11-26 NOTE — Discharge Summary (Signed)
  [name], [name] NO.:  [id number]  MEDICAL RECORD NO.:  [id number]  LOCATION:  3526                         FACILITY:  [facility]  PHYSICIAN:  Cristi Loron, M.D.DATE OF BIRTH:  1970/07/26  DATE OF ADMISSION:  10/22/2010 DATE OF DISCHARGE:  10/22/2010                              DISCHARGE SUMMARY   BRIEF HISTORY:  The patient is a 40 year old white female who has previously undergone a C6-7 anterior cervical diskectomy and fusion plating years ago.  She has done well except for some chronic neck pain. The patient now developed severe left arm pain, numbness, and tingling consistent with a C6 radiculopathy.  She has failed medical management and was worked up with a cervical MRI which demonstrated the patient had a herniated disk at C5-6.  I discussed the various treatment options with the patient including surgery.  The patient has weighed the risks, benefits, and alternatives of surgery, decided to proceed with an exploration of cervical fusion with a C5-6 anterior cervical diskectomy with fusion and plating.  For further details of this admission, please refer to typed history and physical.  HOSPITAL COURSE:  I admitted the patient to hospital on October 22, 2010. On the day of admission, I performed exploration of cervical fusion with a C5-6 anterior cervical diskectomy fusion plating.  The surgery went well (for full details of this operation please refer to typed operative note).  POSTOPERATIVE COURSE:  The patient's postoperative course was unremarkable and she was discharged home on October 22, 2010.  DISCHARGE INSTRUCTIONS:  The patient was given oral and written discharge instructions, instructed to follow up with me in 4 weeks.  FINAL DIAGNOSIS:  C5-6 herniated stenosis, spinal stenosis, cervical radiculopathy/myelopathy, cervicalgia.  PROCEDURE PERFORMED:  Exploration of cervical fusion; C5-6 extensive anterior cervical  diskectomy/decompression; C5-6 anterior interbody arthrodesis with local morselized autograft bone and Actifuse bone graft extender; insertion of interbody prosthesis at C5-6 (Zimmer PEEK interbody prosthesis); C5-6 anterior cervical plating, globus titanium plate and screws.     Cristi Loron, M.D.     JDJ/MEDQ  D:  11/13/2010  T:  11/14/2010  Job:  [id number]  Electronically Signed by Tressie Stalker M.D. on 11/26/2010 07:31:00 AM

## 2011-04-15 DIAGNOSIS — Z6826 Body mass index (BMI) 26.0-26.9, adult: Secondary | ICD-10-CM | POA: Diagnosis not present

## 2011-04-15 DIAGNOSIS — G8929 Other chronic pain: Secondary | ICD-10-CM | POA: Diagnosis not present

## 2011-04-15 DIAGNOSIS — F411 Generalized anxiety disorder: Secondary | ICD-10-CM | POA: Diagnosis not present

## 2011-04-15 DIAGNOSIS — G4700 Insomnia, unspecified: Secondary | ICD-10-CM | POA: Diagnosis not present

## 2011-05-13 ENCOUNTER — Other Ambulatory Visit (HOSPITAL_COMMUNITY): Payer: Self-pay | Admitting: Internal Medicine

## 2011-05-13 DIAGNOSIS — G4700 Insomnia, unspecified: Secondary | ICD-10-CM | POA: Diagnosis not present

## 2011-05-13 DIAGNOSIS — E049 Nontoxic goiter, unspecified: Secondary | ICD-10-CM | POA: Diagnosis not present

## 2011-05-13 DIAGNOSIS — G8929 Other chronic pain: Secondary | ICD-10-CM | POA: Diagnosis not present

## 2011-05-13 DIAGNOSIS — Z6826 Body mass index (BMI) 26.0-26.9, adult: Secondary | ICD-10-CM | POA: Diagnosis not present

## 2011-05-14 ENCOUNTER — Ambulatory Visit (HOSPITAL_COMMUNITY)
Admission: RE | Admit: 2011-05-14 | Discharge: 2011-05-14 | Disposition: A | Discharge status: Home / Self Care | Payer: Medicare Other | Source: Ambulatory Visit | Attending: Internal Medicine | Admitting: Internal Medicine

## 2011-05-14 DIAGNOSIS — E049 Nontoxic goiter, unspecified: Secondary | ICD-10-CM | POA: Diagnosis not present

## 2011-05-14 DIAGNOSIS — E042 Nontoxic multinodular goiter: Secondary | ICD-10-CM | POA: Diagnosis not present

## 2011-06-10 DIAGNOSIS — G8929 Other chronic pain: Secondary | ICD-10-CM | POA: Diagnosis not present

## 2011-06-10 DIAGNOSIS — M810 Age-related osteoporosis without current pathological fracture: Secondary | ICD-10-CM | POA: Diagnosis not present

## 2011-06-10 DIAGNOSIS — K219 Gastro-esophageal reflux disease without esophagitis: Secondary | ICD-10-CM | POA: Diagnosis not present

## 2011-07-09 DIAGNOSIS — Z6828 Body mass index (BMI) 28.0-28.9, adult: Secondary | ICD-10-CM | POA: Diagnosis not present

## 2011-07-09 DIAGNOSIS — G4700 Insomnia, unspecified: Secondary | ICD-10-CM | POA: Diagnosis not present

## 2011-07-09 DIAGNOSIS — K219 Gastro-esophageal reflux disease without esophagitis: Secondary | ICD-10-CM | POA: Diagnosis not present

## 2011-07-09 DIAGNOSIS — F411 Generalized anxiety disorder: Secondary | ICD-10-CM | POA: Diagnosis not present

## 2011-07-09 DIAGNOSIS — G8929 Other chronic pain: Secondary | ICD-10-CM | POA: Diagnosis not present

## 2011-08-07 DIAGNOSIS — F411 Generalized anxiety disorder: Secondary | ICD-10-CM | POA: Diagnosis not present

## 2011-08-07 DIAGNOSIS — Z6826 Body mass index (BMI) 26.0-26.9, adult: Secondary | ICD-10-CM | POA: Diagnosis not present

## 2011-08-07 DIAGNOSIS — G8929 Other chronic pain: Secondary | ICD-10-CM | POA: Diagnosis not present

## 2011-09-06 DIAGNOSIS — G8929 Other chronic pain: Secondary | ICD-10-CM | POA: Diagnosis not present

## 2011-09-06 DIAGNOSIS — Z6827 Body mass index (BMI) 27.0-27.9, adult: Secondary | ICD-10-CM | POA: Diagnosis not present

## 2011-09-06 DIAGNOSIS — F411 Generalized anxiety disorder: Secondary | ICD-10-CM | POA: Diagnosis not present

## 2011-09-08 DIAGNOSIS — R110 Nausea: Secondary | ICD-10-CM | POA: Diagnosis not present

## 2011-09-08 DIAGNOSIS — R51 Headache: Secondary | ICD-10-CM | POA: Diagnosis not present

## 2011-09-08 DIAGNOSIS — F17200 Nicotine dependence, unspecified, uncomplicated: Secondary | ICD-10-CM | POA: Diagnosis not present

## 2011-09-08 DIAGNOSIS — Z79899 Other long term (current) drug therapy: Secondary | ICD-10-CM | POA: Diagnosis not present

## 2011-09-08 DIAGNOSIS — R5381 Other malaise: Secondary | ICD-10-CM | POA: Diagnosis not present

## 2011-09-13 DIAGNOSIS — R21 Rash and other nonspecific skin eruption: Secondary | ICD-10-CM | POA: Diagnosis not present

## 2011-09-13 DIAGNOSIS — L259 Unspecified contact dermatitis, unspecified cause: Secondary | ICD-10-CM | POA: Diagnosis not present

## 2011-09-13 DIAGNOSIS — Z6828 Body mass index (BMI) 28.0-28.9, adult: Secondary | ICD-10-CM | POA: Diagnosis not present

## 2011-09-19 DIAGNOSIS — R109 Unspecified abdominal pain: Secondary | ICD-10-CM | POA: Diagnosis not present

## 2011-09-19 DIAGNOSIS — I2699 Other pulmonary embolism without acute cor pulmonale: Secondary | ICD-10-CM | POA: Diagnosis not present

## 2011-09-19 DIAGNOSIS — R079 Chest pain, unspecified: Secondary | ICD-10-CM | POA: Diagnosis not present

## 2011-09-19 DIAGNOSIS — R0789 Other chest pain: Secondary | ICD-10-CM | POA: Diagnosis not present

## 2011-09-19 DIAGNOSIS — F17200 Nicotine dependence, unspecified, uncomplicated: Secondary | ICD-10-CM | POA: Diagnosis not present

## 2011-09-19 DIAGNOSIS — J9819 Other pulmonary collapse: Secondary | ICD-10-CM | POA: Diagnosis not present

## 2011-09-19 DIAGNOSIS — G8929 Other chronic pain: Secondary | ICD-10-CM | POA: Diagnosis not present

## 2011-09-19 DIAGNOSIS — Z79899 Other long term (current) drug therapy: Secondary | ICD-10-CM | POA: Diagnosis not present

## 2011-09-19 DIAGNOSIS — K219 Gastro-esophageal reflux disease without esophagitis: Secondary | ICD-10-CM | POA: Diagnosis not present

## 2011-09-19 DIAGNOSIS — M549 Dorsalgia, unspecified: Secondary | ICD-10-CM | POA: Diagnosis not present

## 2011-10-04 DIAGNOSIS — Z6827 Body mass index (BMI) 27.0-27.9, adult: Secondary | ICD-10-CM | POA: Diagnosis not present

## 2011-10-04 DIAGNOSIS — G8929 Other chronic pain: Secondary | ICD-10-CM | POA: Diagnosis not present

## 2011-10-04 DIAGNOSIS — F411 Generalized anxiety disorder: Secondary | ICD-10-CM | POA: Diagnosis not present

## 2011-11-01 DIAGNOSIS — N390 Urinary tract infection, site not specified: Secondary | ICD-10-CM | POA: Diagnosis not present

## 2011-11-01 DIAGNOSIS — F411 Generalized anxiety disorder: Secondary | ICD-10-CM | POA: Diagnosis not present

## 2011-11-01 DIAGNOSIS — Z6827 Body mass index (BMI) 27.0-27.9, adult: Secondary | ICD-10-CM | POA: Diagnosis not present

## 2011-11-01 DIAGNOSIS — G8929 Other chronic pain: Secondary | ICD-10-CM | POA: Diagnosis not present

## 2011-11-25 ENCOUNTER — Other Ambulatory Visit (HOSPITAL_COMMUNITY): Payer: Self-pay | Admitting: Internal Medicine

## 2011-11-25 DIAGNOSIS — IMO0002 Reserved for concepts with insufficient information to code with codable children: Secondary | ICD-10-CM | POA: Diagnosis not present

## 2011-11-25 DIAGNOSIS — F411 Generalized anxiety disorder: Secondary | ICD-10-CM | POA: Diagnosis not present

## 2011-11-25 DIAGNOSIS — K219 Gastro-esophageal reflux disease without esophagitis: Secondary | ICD-10-CM | POA: Diagnosis not present

## 2011-11-25 DIAGNOSIS — G8929 Other chronic pain: Secondary | ICD-10-CM | POA: Diagnosis not present

## 2011-11-25 DIAGNOSIS — Z139 Encounter for screening, unspecified: Secondary | ICD-10-CM

## 2011-11-28 ENCOUNTER — Ambulatory Visit (HOSPITAL_COMMUNITY): Payer: Medicare Other

## 2011-12-03 ENCOUNTER — Ambulatory Visit (HOSPITAL_COMMUNITY): Payer: Medicare Other

## 2011-12-27 ENCOUNTER — Ambulatory Visit (HOSPITAL_COMMUNITY): Payer: Medicare Other

## 2011-12-27 DIAGNOSIS — G8929 Other chronic pain: Secondary | ICD-10-CM | POA: Diagnosis not present

## 2011-12-27 DIAGNOSIS — Z6828 Body mass index (BMI) 28.0-28.9, adult: Secondary | ICD-10-CM | POA: Diagnosis not present

## 2011-12-27 DIAGNOSIS — K219 Gastro-esophageal reflux disease without esophagitis: Secondary | ICD-10-CM | POA: Diagnosis not present

## 2011-12-27 DIAGNOSIS — Z79899 Other long term (current) drug therapy: Secondary | ICD-10-CM | POA: Diagnosis not present

## 2011-12-27 DIAGNOSIS — A638 Other specified predominantly sexually transmitted diseases: Secondary | ICD-10-CM | POA: Diagnosis not present

## 2011-12-27 DIAGNOSIS — Z23 Encounter for immunization: Secondary | ICD-10-CM | POA: Diagnosis not present

## 2011-12-27 DIAGNOSIS — N390 Urinary tract infection, site not specified: Secondary | ICD-10-CM | POA: Diagnosis not present

## 2011-12-30 ENCOUNTER — Ambulatory Visit (HOSPITAL_COMMUNITY)
Admission: RE | Admit: 2011-12-30 | Discharge: 2011-12-30 | Disposition: A | Discharge status: Home / Self Care | Payer: Medicare Other | Source: Ambulatory Visit | Attending: Internal Medicine | Admitting: Internal Medicine

## 2011-12-30 DIAGNOSIS — Z1231 Encounter for screening mammogram for malignant neoplasm of breast: Secondary | ICD-10-CM | POA: Insufficient documentation

## 2011-12-30 DIAGNOSIS — R922 Inconclusive mammogram: Secondary | ICD-10-CM | POA: Diagnosis not present

## 2011-12-30 DIAGNOSIS — Z139 Encounter for screening, unspecified: Secondary | ICD-10-CM

## 2012-01-03 DIAGNOSIS — Z79899 Other long term (current) drug therapy: Secondary | ICD-10-CM | POA: Diagnosis not present

## 2012-01-03 DIAGNOSIS — R0902 Hypoxemia: Secondary | ICD-10-CM | POA: Diagnosis not present

## 2012-01-03 DIAGNOSIS — R5383 Other fatigue: Secondary | ICD-10-CM | POA: Diagnosis not present

## 2012-01-03 DIAGNOSIS — R918 Other nonspecific abnormal finding of lung field: Secondary | ICD-10-CM | POA: Diagnosis not present

## 2012-01-03 DIAGNOSIS — R112 Nausea with vomiting, unspecified: Secondary | ICD-10-CM | POA: Diagnosis not present

## 2012-01-03 DIAGNOSIS — F17200 Nicotine dependence, unspecified, uncomplicated: Secondary | ICD-10-CM | POA: Diagnosis not present

## 2012-01-03 DIAGNOSIS — R5381 Other malaise: Secondary | ICD-10-CM | POA: Diagnosis not present

## 2012-01-03 DIAGNOSIS — R609 Edema, unspecified: Secondary | ICD-10-CM | POA: Diagnosis not present

## 2012-01-03 DIAGNOSIS — L299 Pruritus, unspecified: Secondary | ICD-10-CM | POA: Diagnosis not present

## 2012-01-03 DIAGNOSIS — R51 Headache: Secondary | ICD-10-CM | POA: Diagnosis not present

## 2012-01-03 DIAGNOSIS — I517 Cardiomegaly: Secondary | ICD-10-CM | POA: Diagnosis not present

## 2012-01-04 DIAGNOSIS — Z23 Encounter for immunization: Secondary | ICD-10-CM | POA: Diagnosis not present

## 2012-01-04 DIAGNOSIS — N390 Urinary tract infection, site not specified: Secondary | ICD-10-CM | POA: Diagnosis not present

## 2012-01-04 DIAGNOSIS — I517 Cardiomegaly: Secondary | ICD-10-CM | POA: Diagnosis not present

## 2012-01-04 DIAGNOSIS — F411 Generalized anxiety disorder: Secondary | ICD-10-CM | POA: Diagnosis not present

## 2012-01-04 DIAGNOSIS — G90529 Complex regional pain syndrome I of unspecified lower limb: Secondary | ICD-10-CM | POA: Diagnosis not present

## 2012-01-04 DIAGNOSIS — Z8679 Personal history of other diseases of the circulatory system: Secondary | ICD-10-CM | POA: Diagnosis not present

## 2012-01-04 DIAGNOSIS — F17200 Nicotine dependence, unspecified, uncomplicated: Secondary | ICD-10-CM | POA: Diagnosis not present

## 2012-01-04 DIAGNOSIS — E876 Hypokalemia: Secondary | ICD-10-CM | POA: Diagnosis not present

## 2012-01-04 DIAGNOSIS — Z885 Allergy status to narcotic agent status: Secondary | ICD-10-CM | POA: Diagnosis not present

## 2012-01-04 DIAGNOSIS — K219 Gastro-esophageal reflux disease without esophagitis: Secondary | ICD-10-CM | POA: Diagnosis not present

## 2012-01-04 DIAGNOSIS — R609 Edema, unspecified: Secondary | ICD-10-CM | POA: Diagnosis not present

## 2012-01-04 DIAGNOSIS — Z79899 Other long term (current) drug therapy: Secondary | ICD-10-CM | POA: Diagnosis not present

## 2012-01-04 DIAGNOSIS — M7989 Other specified soft tissue disorders: Secondary | ICD-10-CM | POA: Diagnosis not present

## 2012-01-04 DIAGNOSIS — E8809 Other disorders of plasma-protein metabolism, not elsewhere classified: Secondary | ICD-10-CM | POA: Diagnosis not present

## 2012-01-04 DIAGNOSIS — R918 Other nonspecific abnormal finding of lung field: Secondary | ICD-10-CM | POA: Diagnosis not present

## 2012-01-04 DIAGNOSIS — R0902 Hypoxemia: Secondary | ICD-10-CM | POA: Diagnosis not present

## 2012-01-05 DIAGNOSIS — R0902 Hypoxemia: Secondary | ICD-10-CM | POA: Diagnosis not present

## 2012-01-05 DIAGNOSIS — N390 Urinary tract infection, site not specified: Secondary | ICD-10-CM | POA: Diagnosis not present

## 2012-01-05 DIAGNOSIS — E876 Hypokalemia: Secondary | ICD-10-CM | POA: Diagnosis not present

## 2012-01-05 DIAGNOSIS — R609 Edema, unspecified: Secondary | ICD-10-CM | POA: Diagnosis not present

## 2012-01-05 DIAGNOSIS — I509 Heart failure, unspecified: Secondary | ICD-10-CM | POA: Diagnosis not present

## 2012-01-08 DIAGNOSIS — F17200 Nicotine dependence, unspecified, uncomplicated: Secondary | ICD-10-CM | POA: Diagnosis not present

## 2012-01-08 DIAGNOSIS — Z79899 Other long term (current) drug therapy: Secondary | ICD-10-CM | POA: Diagnosis not present

## 2012-01-08 DIAGNOSIS — R51 Headache: Secondary | ICD-10-CM | POA: Diagnosis not present

## 2012-01-08 DIAGNOSIS — G43909 Migraine, unspecified, not intractable, without status migrainosus: Secondary | ICD-10-CM | POA: Diagnosis not present

## 2012-01-08 DIAGNOSIS — R0602 Shortness of breath: Secondary | ICD-10-CM | POA: Diagnosis not present

## 2012-01-10 ENCOUNTER — Other Ambulatory Visit: Payer: Self-pay | Admitting: Internal Medicine

## 2012-01-10 DIAGNOSIS — R928 Other abnormal and inconclusive findings on diagnostic imaging of breast: Secondary | ICD-10-CM

## 2012-01-17 DIAGNOSIS — M545 Low back pain: Secondary | ICD-10-CM | POA: Diagnosis not present

## 2012-01-17 DIAGNOSIS — E46 Unspecified protein-calorie malnutrition: Secondary | ICD-10-CM | POA: Diagnosis not present

## 2012-01-17 DIAGNOSIS — R0602 Shortness of breath: Secondary | ICD-10-CM | POA: Diagnosis not present

## 2012-01-17 DIAGNOSIS — F411 Generalized anxiety disorder: Secondary | ICD-10-CM | POA: Diagnosis not present

## 2012-01-17 DIAGNOSIS — I5022 Chronic systolic (congestive) heart failure: Secondary | ICD-10-CM | POA: Diagnosis not present

## 2012-01-17 DIAGNOSIS — F17200 Nicotine dependence, unspecified, uncomplicated: Secondary | ICD-10-CM | POA: Diagnosis not present

## 2012-01-17 DIAGNOSIS — Z820 Family history of epilepsy and other diseases of the nervous system: Secondary | ICD-10-CM | POA: Diagnosis not present

## 2012-01-17 DIAGNOSIS — K219 Gastro-esophageal reflux disease without esophagitis: Secondary | ICD-10-CM | POA: Diagnosis not present

## 2012-01-17 DIAGNOSIS — R0989 Other specified symptoms and signs involving the circulatory and respiratory systems: Secondary | ICD-10-CM | POA: Diagnosis not present

## 2012-01-17 DIAGNOSIS — R51 Headache: Secondary | ICD-10-CM | POA: Diagnosis not present

## 2012-01-17 DIAGNOSIS — Z885 Allergy status to narcotic agent status: Secondary | ICD-10-CM | POA: Diagnosis not present

## 2012-01-17 DIAGNOSIS — Z79899 Other long term (current) drug therapy: Secondary | ICD-10-CM | POA: Diagnosis not present

## 2012-01-17 DIAGNOSIS — J449 Chronic obstructive pulmonary disease, unspecified: Secondary | ICD-10-CM | POA: Diagnosis not present

## 2012-01-17 DIAGNOSIS — E876 Hypokalemia: Secondary | ICD-10-CM | POA: Diagnosis not present

## 2012-01-17 DIAGNOSIS — Z8679 Personal history of other diseases of the circulatory system: Secondary | ICD-10-CM | POA: Diagnosis not present

## 2012-01-17 DIAGNOSIS — R0789 Other chest pain: Secondary | ICD-10-CM | POA: Diagnosis not present

## 2012-01-17 DIAGNOSIS — I509 Heart failure, unspecified: Secondary | ICD-10-CM | POA: Diagnosis not present

## 2012-01-17 DIAGNOSIS — R079 Chest pain, unspecified: Secondary | ICD-10-CM | POA: Diagnosis not present

## 2012-01-17 DIAGNOSIS — F329 Major depressive disorder, single episode, unspecified: Secondary | ICD-10-CM | POA: Diagnosis not present

## 2012-01-17 DIAGNOSIS — Z8489 Family history of other specified conditions: Secondary | ICD-10-CM | POA: Diagnosis not present

## 2012-01-17 DIAGNOSIS — G8929 Other chronic pain: Secondary | ICD-10-CM | POA: Diagnosis not present

## 2012-01-18 DIAGNOSIS — R072 Precordial pain: Secondary | ICD-10-CM | POA: Diagnosis not present

## 2012-01-18 DIAGNOSIS — R079 Chest pain, unspecified: Secondary | ICD-10-CM | POA: Diagnosis not present

## 2012-01-18 DIAGNOSIS — R0989 Other specified symptoms and signs involving the circulatory and respiratory systems: Secondary | ICD-10-CM | POA: Diagnosis not present

## 2012-01-18 DIAGNOSIS — E876 Hypokalemia: Secondary | ICD-10-CM | POA: Diagnosis not present

## 2012-01-18 DIAGNOSIS — R51 Headache: Secondary | ICD-10-CM | POA: Diagnosis not present

## 2012-01-23 DIAGNOSIS — R079 Chest pain, unspecified: Secondary | ICD-10-CM

## 2012-01-27 ENCOUNTER — Other Ambulatory Visit (HOSPITAL_COMMUNITY): Payer: Self-pay | Admitting: Internal Medicine

## 2012-01-27 DIAGNOSIS — E876 Hypokalemia: Secondary | ICD-10-CM | POA: Diagnosis not present

## 2012-01-27 DIAGNOSIS — R928 Other abnormal and inconclusive findings on diagnostic imaging of breast: Secondary | ICD-10-CM

## 2012-01-27 DIAGNOSIS — G8929 Other chronic pain: Secondary | ICD-10-CM | POA: Diagnosis not present

## 2012-01-27 DIAGNOSIS — Z6827 Body mass index (BMI) 27.0-27.9, adult: Secondary | ICD-10-CM | POA: Diagnosis not present

## 2012-01-27 DIAGNOSIS — F411 Generalized anxiety disorder: Secondary | ICD-10-CM | POA: Diagnosis not present

## 2012-01-27 DIAGNOSIS — F329 Major depressive disorder, single episode, unspecified: Secondary | ICD-10-CM | POA: Diagnosis not present

## 2012-02-05 ENCOUNTER — Encounter (HOSPITAL_COMMUNITY): Payer: Medicare Other

## 2012-02-05 DIAGNOSIS — Z713 Dietary counseling and surveillance: Secondary | ICD-10-CM | POA: Diagnosis not present

## 2012-02-05 DIAGNOSIS — J343 Hypertrophy of nasal turbinates: Secondary | ICD-10-CM | POA: Diagnosis not present

## 2012-02-05 DIAGNOSIS — Z7182 Exercise counseling: Secondary | ICD-10-CM | POA: Diagnosis not present

## 2012-02-05 DIAGNOSIS — J029 Acute pharyngitis, unspecified: Secondary | ICD-10-CM | POA: Diagnosis not present

## 2012-03-04 ENCOUNTER — Other Ambulatory Visit: Payer: Self-pay | Admitting: Internal Medicine

## 2012-03-04 ENCOUNTER — Ambulatory Visit (HOSPITAL_COMMUNITY)
Admission: RE | Admit: 2012-03-04 | Discharge: 2012-03-04 | Disposition: A | Discharge status: Home / Self Care | Payer: Medicare Other | Source: Ambulatory Visit | Attending: Internal Medicine | Admitting: Internal Medicine

## 2012-03-04 DIAGNOSIS — R928 Other abnormal and inconclusive findings on diagnostic imaging of breast: Secondary | ICD-10-CM

## 2012-03-04 DIAGNOSIS — R921 Mammographic calcification found on diagnostic imaging of breast: Secondary | ICD-10-CM

## 2012-03-16 ENCOUNTER — Ambulatory Visit
Admission: RE | Admit: 2012-03-16 | Discharge: 2012-03-16 | Disposition: A | Discharge status: Home / Self Care | Payer: Medicare Other | Source: Ambulatory Visit | Attending: Internal Medicine | Admitting: Internal Medicine

## 2012-03-16 ENCOUNTER — Other Ambulatory Visit: Payer: Self-pay | Admitting: Internal Medicine

## 2012-03-16 DIAGNOSIS — D249 Benign neoplasm of unspecified breast: Secondary | ICD-10-CM | POA: Diagnosis not present

## 2012-03-16 DIAGNOSIS — R921 Mammographic calcification found on diagnostic imaging of breast: Secondary | ICD-10-CM

## 2012-03-23 DIAGNOSIS — G8929 Other chronic pain: Secondary | ICD-10-CM | POA: Diagnosis not present

## 2012-03-23 DIAGNOSIS — Z6826 Body mass index (BMI) 26.0-26.9, adult: Secondary | ICD-10-CM | POA: Diagnosis not present

## 2012-03-23 DIAGNOSIS — F411 Generalized anxiety disorder: Secondary | ICD-10-CM | POA: Diagnosis not present

## 2012-03-23 DIAGNOSIS — K2970 Gastritis, unspecified, without bleeding: Secondary | ICD-10-CM | POA: Diagnosis not present

## 2012-04-20 DIAGNOSIS — J209 Acute bronchitis, unspecified: Secondary | ICD-10-CM | POA: Diagnosis not present

## 2012-04-20 DIAGNOSIS — Z719 Counseling, unspecified: Secondary | ICD-10-CM | POA: Diagnosis not present

## 2012-04-20 DIAGNOSIS — K219 Gastro-esophageal reflux disease without esophagitis: Secondary | ICD-10-CM | POA: Diagnosis not present

## 2012-04-20 DIAGNOSIS — J019 Acute sinusitis, unspecified: Secondary | ICD-10-CM | POA: Diagnosis not present

## 2012-04-20 DIAGNOSIS — F411 Generalized anxiety disorder: Secondary | ICD-10-CM | POA: Diagnosis not present

## 2012-04-20 DIAGNOSIS — G8929 Other chronic pain: Secondary | ICD-10-CM | POA: Diagnosis not present

## 2012-04-20 DIAGNOSIS — Z6825 Body mass index (BMI) 25.0-25.9, adult: Secondary | ICD-10-CM | POA: Diagnosis not present

## 2012-05-19 DIAGNOSIS — Z6827 Body mass index (BMI) 27.0-27.9, adult: Secondary | ICD-10-CM | POA: Diagnosis not present

## 2012-05-19 DIAGNOSIS — G8929 Other chronic pain: Secondary | ICD-10-CM | POA: Diagnosis not present

## 2012-05-19 DIAGNOSIS — F411 Generalized anxiety disorder: Secondary | ICD-10-CM | POA: Diagnosis not present

## 2012-05-19 DIAGNOSIS — M7650 Patellar tendinitis, unspecified knee: Secondary | ICD-10-CM | POA: Diagnosis not present

## 2012-06-15 DIAGNOSIS — Z6827 Body mass index (BMI) 27.0-27.9, adult: Secondary | ICD-10-CM | POA: Diagnosis not present

## 2012-06-15 DIAGNOSIS — G8929 Other chronic pain: Secondary | ICD-10-CM | POA: Diagnosis not present

## 2012-07-01 DIAGNOSIS — Z79899 Other long term (current) drug therapy: Secondary | ICD-10-CM | POA: Diagnosis not present

## 2012-07-01 DIAGNOSIS — R51 Headache: Secondary | ICD-10-CM | POA: Diagnosis not present

## 2012-07-01 DIAGNOSIS — G43909 Migraine, unspecified, not intractable, without status migrainosus: Secondary | ICD-10-CM | POA: Diagnosis not present

## 2012-07-01 DIAGNOSIS — I509 Heart failure, unspecified: Secondary | ICD-10-CM | POA: Diagnosis not present

## 2012-07-01 DIAGNOSIS — F17200 Nicotine dependence, unspecified, uncomplicated: Secondary | ICD-10-CM | POA: Diagnosis not present

## 2012-07-17 DIAGNOSIS — Z6829 Body mass index (BMI) 29.0-29.9, adult: Secondary | ICD-10-CM | POA: Diagnosis not present

## 2012-07-17 DIAGNOSIS — G8929 Other chronic pain: Secondary | ICD-10-CM | POA: Diagnosis not present

## 2012-07-17 DIAGNOSIS — K219 Gastro-esophageal reflux disease without esophagitis: Secondary | ICD-10-CM | POA: Diagnosis not present

## 2012-07-17 DIAGNOSIS — F329 Major depressive disorder, single episode, unspecified: Secondary | ICD-10-CM | POA: Diagnosis not present

## 2012-08-09 DIAGNOSIS — S60229A Contusion of unspecified hand, initial encounter: Secondary | ICD-10-CM | POA: Diagnosis not present

## 2012-08-09 DIAGNOSIS — M79609 Pain in unspecified limb: Secondary | ICD-10-CM | POA: Diagnosis not present

## 2012-08-09 DIAGNOSIS — I509 Heart failure, unspecified: Secondary | ICD-10-CM | POA: Diagnosis not present

## 2012-08-09 DIAGNOSIS — Z79899 Other long term (current) drug therapy: Secondary | ICD-10-CM | POA: Diagnosis not present

## 2012-08-09 DIAGNOSIS — F17200 Nicotine dependence, unspecified, uncomplicated: Secondary | ICD-10-CM | POA: Diagnosis not present

## 2012-08-09 DIAGNOSIS — Z23 Encounter for immunization: Secondary | ICD-10-CM | POA: Diagnosis not present

## 2012-08-12 ENCOUNTER — Ambulatory Visit (HOSPITAL_COMMUNITY)
Admission: RE | Admit: 2012-08-12 | Discharge: 2012-08-12 | Disposition: A | Discharge status: Home / Self Care | Payer: Medicare Other | Source: Ambulatory Visit | Attending: Internal Medicine | Admitting: Internal Medicine

## 2012-08-12 ENCOUNTER — Other Ambulatory Visit (HOSPITAL_COMMUNITY): Payer: Self-pay | Admitting: Internal Medicine

## 2012-08-12 DIAGNOSIS — M25549 Pain in joints of unspecified hand: Secondary | ICD-10-CM | POA: Diagnosis not present

## 2012-08-12 DIAGNOSIS — M79609 Pain in unspecified limb: Secondary | ICD-10-CM | POA: Insufficient documentation

## 2012-08-12 DIAGNOSIS — G8929 Other chronic pain: Secondary | ICD-10-CM | POA: Diagnosis not present

## 2012-08-12 DIAGNOSIS — M79641 Pain in right hand: Secondary | ICD-10-CM

## 2012-08-12 DIAGNOSIS — Z6829 Body mass index (BMI) 29.0-29.9, adult: Secondary | ICD-10-CM | POA: Diagnosis not present

## 2012-08-12 DIAGNOSIS — Z713 Dietary counseling and surveillance: Secondary | ICD-10-CM | POA: Diagnosis not present

## 2012-08-13 DIAGNOSIS — S60229A Contusion of unspecified hand, initial encounter: Secondary | ICD-10-CM | POA: Diagnosis not present

## 2012-08-27 DIAGNOSIS — S62309A Unspecified fracture of unspecified metacarpal bone, initial encounter for closed fracture: Secondary | ICD-10-CM | POA: Diagnosis not present

## 2012-09-07 ENCOUNTER — Emergency Department (HOSPITAL_COMMUNITY)
Admission: EM | Admit: 2012-09-07 | Discharge: 2012-09-08 | Disposition: A | Discharge status: Home / Self Care | Payer: Medicare Other | Attending: Emergency Medicine | Admitting: Emergency Medicine

## 2012-09-07 ENCOUNTER — Encounter (HOSPITAL_COMMUNITY): Payer: Self-pay | Admitting: Emergency Medicine

## 2012-09-07 DIAGNOSIS — F411 Generalized anxiety disorder: Secondary | ICD-10-CM | POA: Insufficient documentation

## 2012-09-07 DIAGNOSIS — F17200 Nicotine dependence, unspecified, uncomplicated: Secondary | ICD-10-CM | POA: Insufficient documentation

## 2012-09-07 DIAGNOSIS — F419 Anxiety disorder, unspecified: Secondary | ICD-10-CM

## 2012-09-07 DIAGNOSIS — F1110 Opioid abuse, uncomplicated: Secondary | ICD-10-CM | POA: Insufficient documentation

## 2012-09-07 DIAGNOSIS — F319 Bipolar disorder, unspecified: Secondary | ICD-10-CM | POA: Insufficient documentation

## 2012-09-07 DIAGNOSIS — Z79899 Other long term (current) drug therapy: Secondary | ICD-10-CM | POA: Insufficient documentation

## 2012-09-07 DIAGNOSIS — F3289 Other specified depressive episodes: Secondary | ICD-10-CM | POA: Insufficient documentation

## 2012-09-07 DIAGNOSIS — F1910 Other psychoactive substance abuse, uncomplicated: Secondary | ICD-10-CM

## 2012-09-07 DIAGNOSIS — F329 Major depressive disorder, single episode, unspecified: Secondary | ICD-10-CM

## 2012-09-07 DIAGNOSIS — F32A Depression, unspecified: Secondary | ICD-10-CM

## 2012-09-07 HISTORY — DX: Complex regional pain syndrome I, unspecified: G90.50

## 2012-09-07 HISTORY — DX: Anxiety disorder, unspecified: F41.9

## 2012-09-07 LAB — COMPREHENSIVE METABOLIC PANEL
ALT: 7 U/L (ref 0–35)
AST: 14 U/L (ref 0–37)
Albumin: 4.1 g/dL (ref 3.5–5.2)
Alkaline Phosphatase: 127 U/L — ABNORMAL HIGH (ref 39–117)
BUN: 9 mg/dL (ref 6–23)
CO2: 25 mEq/L (ref 19–32)
Calcium: 9.5 mg/dL (ref 8.4–10.5)
Chloride: 99 mEq/L (ref 96–112)
Creatinine, Ser: 0.83 mg/dL (ref 0.50–1.10)
GFR calc Af Amer: >90 mL/min (ref >90)
GFR calc non Af Amer: 87 mL/min — ABNORMAL LOW (ref >90)
Glucose, Bld: 97 mg/dL (ref 70–99)
Potassium: 2.7 mEq/L — CL (ref 3.5–5.1)
Sodium: 137 mEq/L (ref 135–145)
Total Bilirubin: 0.4 mg/dL (ref 0.3–1.2)
Total Protein: 8.1 g/dL (ref 6.0–8.3)

## 2012-09-07 LAB — CBC
HCT: 40.4 % (ref 36.0–46.0)
Hemoglobin: 14 g/dL (ref 12.0–15.0)
MCH: 31 pg (ref 26.0–34.0)
MCHC: 34.7 g/dL (ref 30.0–36.0)
MCV: 89.4 fL (ref 78.0–100.0)
Platelets: 383 10*3/uL (ref 150–400)
RBC: 4.52 MIL/uL (ref 3.87–5.11)
RDW: 12.2 % (ref 11.5–15.5)
WBC: 12.8 10*3/uL — ABNORMAL HIGH (ref 4.0–10.5)

## 2012-09-07 LAB — RAPID URINE DRUG SCREEN, HOSP PERFORMED
Amphetamines: NOT DETECTED
Barbiturates: NOT DETECTED
Benzodiazepines: POSITIVE — AB
Cocaine: NOT DETECTED
Opiates: NOT DETECTED
Tetrahydrocannabinol: POSITIVE — AB

## 2012-09-07 LAB — SALICYLATE LEVEL: Salicylate Lvl: <2 mg/dL — ABNORMAL LOW (ref 2.8–20.0)

## 2012-09-07 LAB — ETHANOL: Alcohol, Ethyl (B): <11 mg/dL (ref 0–11)

## 2012-09-07 LAB — ACETAMINOPHEN LEVEL: Acetaminophen (Tylenol), Serum: <15 ug/mL (ref 10–30)

## 2012-09-07 LAB — MAGNESIUM: Magnesium: 2.1 mg/dL (ref 1.5–2.5)

## 2012-09-07 LAB — GLUCOSE, CAPILLARY: Glucose-Capillary: 131 mg/dL — ABNORMAL HIGH (ref 70–99)

## 2012-09-07 LAB — POCT PREGNANCY, URINE: Preg Test, Ur: NEGATIVE

## 2012-09-07 MED ORDER — POTASSIUM CHLORIDE CRYS ER 20 MEQ PO TBCR
40.0000 meq | EXTENDED_RELEASE_TABLET | Freq: Once | ORAL | Status: AC
  Administered 2012-09-07: 40 meq via ORAL
  Filled 2012-09-07: qty 2

## 2012-09-07 MED ORDER — CLONIDINE HCL 0.1 MG PO TABS
0.2000 mg | ORAL_TABLET | Freq: Once | ORAL | Status: AC
Start: 2012-09-07 — End: 2012-09-07
  Administered 2012-09-07: 0.2 mg via ORAL
  Filled 2012-09-07: qty 2

## 2012-09-07 MED ORDER — HYDROXYZINE HCL 25 MG PO TABS
25.0000 mg | ORAL_TABLET | Freq: Four times a day (QID) | ORAL | Status: DC | PRN
  Administered 2012-09-08: 25 mg via ORAL
  Filled 2012-09-07: qty 1

## 2012-09-07 MED ORDER — LORAZEPAM 1 MG PO TABS: 1.0000 mg | ORAL_TABLET | Freq: Three times a day (TID) | ORAL | Status: DC | PRN

## 2012-09-07 MED ORDER — METHOCARBAMOL 500 MG PO TABS: 500.0000 mg | ORAL_TABLET | Freq: Three times a day (TID) | ORAL | Status: DC | PRN

## 2012-09-07 MED ORDER — POTASSIUM CHLORIDE CRYS ER 20 MEQ PO TBCR
60.0000 meq | EXTENDED_RELEASE_TABLET | Freq: Once | ORAL | Status: AC
  Administered 2012-09-07: 60 meq via ORAL
  Filled 2012-09-07: qty 3

## 2012-09-07 MED ORDER — ONDANSETRON 4 MG PO TBDP
4.0000 mg | ORAL_TABLET | Freq: Four times a day (QID) | ORAL | Status: DC | PRN
Start: 2012-09-07 — End: 2012-09-08

## 2012-09-07 MED ORDER — NAPROXEN 500 MG PO TABS: 500.0000 mg | ORAL_TABLET | Freq: Two times a day (BID) | ORAL | Status: DC | PRN

## 2012-09-07 MED ORDER — LOPERAMIDE HCL 2 MG PO CAPS: 2.0000 mg | ORAL_CAPSULE | ORAL | Status: DC | PRN

## 2012-09-07 MED ORDER — DICYCLOMINE HCL 20 MG PO TABS: 20.0000 mg | ORAL_TABLET | Freq: Four times a day (QID) | ORAL | Status: DC | PRN

## 2012-09-07 MED ORDER — ACETAMINOPHEN 325 MG PO TABS: 650.0000 mg | ORAL_TABLET | ORAL | Status: DC | PRN

## 2012-09-07 MED ORDER — LORAZEPAM 1 MG PO TABS
2.0000 mg | ORAL_TABLET | Freq: Once | ORAL | Status: AC
  Administered 2012-09-07: 2 mg via ORAL
  Filled 2012-09-07: qty 2

## 2012-09-07 MED ORDER — ZOLPIDEM TARTRATE 5 MG PO TABS: 5.0000 mg | ORAL_TABLET | Freq: Every evening | ORAL | Status: DC | PRN

## 2012-09-07 MED ORDER — ALUM & MAG HYDROXIDE-SIMETH 200-200-20 MG/5ML PO SUSP: 30.0000 mL | ORAL | Status: DC | PRN

## 2012-09-07 MED ORDER — ONDANSETRON HCL 4 MG PO TABS: 4.0000 mg | ORAL_TABLET | Freq: Three times a day (TID) | ORAL | Status: DC | PRN

## 2012-09-07 MED ORDER — IBUPROFEN 600 MG PO TABS: 600.0000 mg | ORAL_TABLET | Freq: Three times a day (TID) | ORAL | Status: DC | PRN

## 2012-09-07 NOTE — ED Notes (Signed)
md Kohut notified and aware of critical potassium

## 2012-09-07 NOTE — Consult Note (Signed)
Reason for Consult: Eval for IP psychiatric admission Referring Physician: Juleen China MD  [name] is an 42 y.o. female.  HPI: Pt is a 42 y/o WF, with long standing hx of depression since 2001, but has not been seen as an OP for quite some time. Pt presents with hopelessness, helplessness, racing thoughts, and sadness with periods of irritability and mood swings. Pt rates her depressive sx 8/10 with periodic feelings of anxiety and related panic attacks. Pt rates her anxiety sx a 10/10 at times and endorses a panic attack about 3 days ago. Pt denies any prior IP psychiatric evaluations and also denies and SI/SA/HI/AVH. Pt can contract for safety. Pt is on psycho-tropics i.e. Celexa 40 mg q hs and benzodiazepines ( Xanax) Rx by her PCP Fusco MD. Pt believes her depressive sx are exacerbated by her dependence on opioids. Pt takes Roxicodone four times daily with additional doses Rx for break through pain. Pt states that she would like to come off of these pain medications as well.  Past Medical History  Diagnosis Date  . Anxiety   . Reflex sympathetic dystrophy     Past Surgical History  Procedure Laterality Date  . Neck surgery    . Back surgery    . Abdominal hysterectomy      partial    No family history on file.  Social History:  reports that she has been smoking.  She has never used smokeless tobacco. She reports that she uses illicit drugs. She reports that she does not drink alcohol.  Allergies:  Allergies  Allergen Reactions  . Darvocet (Propoxyphene-Acetaminophen) Hives  I have reviewed the patient's current medications.  Medications:   Results for orders placed during the hospital encounter of 09/07/12 (from the past 48 hour(s))  ACETAMINOPHEN LEVEL     Status: None   Collection Time    09/07/12  5:10 PM      Result Value Range   Acetaminophen (Tylenol), Serum <15.0  10 - 30 ug/mL   Comment:            THERAPEUTIC CONCENTRATIONS VARY     SIGNIFICANTLY. A RANGE OF  10-30     ug/mL MAY BE AN EFFECTIVE     CONCENTRATION FOR MANY PATIENTS.     HOWEVER, SOME ARE BEST TREATED     AT CONCENTRATIONS OUTSIDE THIS     RANGE.     ACETAMINOPHEN CONCENTRATIONS     >150 ug/mL AT 4 HOURS AFTER     INGESTION AND >50 ug/mL AT 12     HOURS AFTER INGESTION ARE     OFTEN ASSOCIATED WITH TOXIC     REACTIONS.  CBC     Status: Abnormal   Collection Time    09/07/12  5:10 PM      Result Value Range   WBC 12.8 (*) 4.0 - 10.5 K/uL   RBC 4.52  3.87 - 5.11 MIL/uL   Hemoglobin 14.0  12.0 - 15.0 g/dL   HCT 13.0  86.5 - 78.4 %   MCV 89.4  78.0 - 100.0 fL   MCH 31.0  26.0 - 34.0 pg   MCHC 34.7  30.0 - 36.0 g/dL   RDW 69.6  29.5 - 28.4 %   Platelets 383  150 - 400 K/uL  COMPREHENSIVE METABOLIC PANEL     Status: Abnormal   Collection Time    09/07/12  5:10 PM      Result Value Range   Sodium 137  135 -  145 mEq/L   Potassium 2.7 (*) 3.5 - 5.1 mEq/L   Comment: CRITICAL RESULT CALLED TO, READ BACK BY AND VERIFIED WITH:     [id number]   Chloride 99  96 - 112 mEq/L   CO2 25  19 - 32 mEq/L   Glucose, Bld 97  70 - 99 mg/dL   BUN 9  6 - 23 mg/dL   Creatinine, Ser 6.57  0.50 - 1.10 mg/dL   Calcium 9.5  8.4 - 84.6 mg/dL   Total Protein 8.1  6.0 - 8.3 g/dL   Albumin 4.1  3.5 - 5.2 g/dL   AST 14  0 - 37 U/L   ALT 7  0 - 35 U/L   Alkaline Phosphatase 127 (*) 39 - 117 U/L   Total Bilirubin 0.4  0.3 - 1.2 mg/dL   GFR calc non Af Amer 87 (*) >90 mL/min   GFR calc Af Amer >90  >90 mL/min   Comment:            The eGFR has been calculated     using the CKD EPI equation.     This calculation has not been     validated in all clinical     situations.     eGFR's persistently     <90 mL/min signify     possible Chronic Kidney Disease.  ETHANOL     Status: None   Collection Time    09/07/12  5:10 PM      Result Value Range   Alcohol, Ethyl (B) <11  0 - 11 mg/dL   Comment:            LOWEST DETECTABLE LIMIT FOR     SERUM ALCOHOL IS 11 mg/dL     FOR  MEDICAL PURPOSES ONLY  SALICYLATE LEVEL     Status: Abnormal   Collection Time    09/07/12  5:10 PM      Result Value Range   Salicylate Lvl <2.0 (*) 2.8 - 20.0 mg/dL  MAGNESIUM     Status: None   Collection Time    09/07/12  5:10 PM      Result Value Range   Magnesium 2.1  1.5 - 2.5 mg/dL  URINE RAPID DRUG SCREEN (HOSP PERFORMED)     Status: Abnormal   Collection Time    09/07/12  5:12 PM      Result Value Range   Opiates NONE DETECTED  NONE DETECTED   Cocaine NONE DETECTED  NONE DETECTED   Benzodiazepines POSITIVE (*) NONE DETECTED   Amphetamines NONE DETECTED  NONE DETECTED   Tetrahydrocannabinol POSITIVE (*) NONE DETECTED   Barbiturates NONE DETECTED  NONE DETECTED   Comment:            DRUG SCREEN FOR MEDICAL PURPOSES     ONLY.  IF CONFIRMATION IS NEEDED     FOR ANY PURPOSE, NOTIFY LAB     WITHIN 5 DAYS.                LOWEST DETECTABLE LIMITS     FOR URINE DRUG SCREEN     Drug Class       Cutoff (ng/mL)     Amphetamine      1000     Barbiturate      200     Benzodiazepine   200     Tricyclics       300     Opiates  300     Cocaine          300     THC              50  POCT PREGNANCY, URINE     Status: None   Collection Time    09/07/12  5:15 PM      Result Value Range   Preg Test, Ur NEGATIVE  NEGATIVE   Comment:            THE SENSITIVITY OF THIS     METHODOLOGY IS >24 mIU/mL  GLUCOSE, CAPILLARY     Status: Abnormal   Collection Time    09/07/12  9:38 PM      Result Value Range   Glucose-Capillary 131 (*) 70 - 99 mg/dL    No results found.  Review of Systems  Constitutional: Positive for malaise/fatigue. Negative for fever, chills, weight loss and diaphoresis.  HENT: Positive for neck pain.   Eyes: Negative.   Respiratory: Negative.   Cardiovascular: Negative.   Gastrointestinal: Negative.   Genitourinary: Negative.   Musculoskeletal: Positive for myalgias, back pain and joint pain.  Skin: Negative.   Neurological: Positive for weakness.   Endo/Heme/Allergies: Negative.   Psychiatric/Behavioral: Positive for depression, memory loss and substance abuse. Negative for suicidal ideas and hallucinations. The patient is nervous/anxious and has insomnia.        Pt denies SA/SI/HI or AVH, Pt states she can contract for safety   Blood pressure 108/71, pulse 70, temperature 98.5 F (36.9 C), temperature source Oral, resp. rate 18, SpO2 98.00%. Physical Exam  Constitutional: She is oriented to person, place, and time. She appears well-developed. No distress.  HENT:  Head: Normocephalic.  Eyes: Pupils are equal, round, and reactive to light.  Neck: Neck supple. No thyromegaly present.  Cardiovascular: Normal rate and regular rhythm.   No murmur heard. Respiratory: Effort normal.  GI: Bowel sounds are normal.  Genitourinary:  Exam deferred  Musculoskeletal: Normal range of motion.  Lymphadenopathy:    She has no cervical adenopathy.  Neurological: She is alert and oriented to person, place, and time. No cranial nerve deficit.  Skin: Skin is warm and dry.  Psychiatric: Judgment and thought content normal.  Flat, sad affect, good eye contact, answers appropriately     Assessment/Plan:   Pt doesn't meet IP criteria for crisis mgmt nor  safety and stability due to lack of SI/SA/HI or AVH  Recommend OP intensive program for continued psychotherapy and possible OP referral to a psychiatrist for medication mgmt prn.  Brittyn Salaz E 09/07/2012, 11:23 PM

## 2012-09-07 NOTE — ED Notes (Signed)
Pt sts that she feels depressed. Pt takes medication which approximate week ago her mood changed. Pt denies any S.I. Or H.I. Pt denies any use of drugs or ETOH. Pt sts she does not want any Roxicodone or anything of the sort. Last time she used was 3-4 days ago. Pt is attempting to self detox and thus is adding to her mood changes.

## 2012-09-08 ENCOUNTER — Encounter (HOSPITAL_COMMUNITY): Payer: Self-pay | Admitting: Registered Nurse

## 2012-09-08 DIAGNOSIS — F1910 Other psychoactive substance abuse, uncomplicated: Secondary | ICD-10-CM

## 2012-09-08 DIAGNOSIS — F411 Generalized anxiety disorder: Secondary | ICD-10-CM

## 2012-09-08 MED ORDER — NICOTINE 21 MG/24HR TD PT24
21.0000 mg | MEDICATED_PATCH | Freq: Every day | TRANSDERMAL | Status: DC
  Administered 2012-09-08: 21 mg via TRANSDERMAL
  Filled 2012-09-08: qty 1

## 2012-09-08 NOTE — BH Assessment (Signed)
Assessment Note    Patient is a 42 year old white female that reports feelings of depression, helplessness, racing thoughts, and sadness with periods of irritability and mood swings.    Patient reports that she has been taking Celexa 40 mg q hs to address her depression. Patient reports that the medication is not working.  Patient reports that she has been taking this medication for the past 10 years.  Patient reports that she has been compliant with taking her medication.  Patient reports incidents of panic reports. Patient reports a past history of depression since 2001.  Patient denies any previous outpatient therapy.  Patient denies any prior psychiatric hospitalizations.    Patient reports experiencing a panic attack about 3 days ago due to overwhelming feelings of stress associated with having to move out of her home because of financial constraints.  Patient reports that she was previously living with her Kateri Mc and is now living with her mother.   Patient reports that her depression is heightened due to her dependence to opioids.  Patient reports that her last use was 3-4 days ago. Patient reports that she is attempting to self detox.  Therefore, this is why she is experiencing so many mood changes.   Patient reports that she takes Roxicodone four times daily with additional doses Rx for break through pain due to back pain.  Patient states that she would like to come off of these pain medications as well but is unable to do so. Patient denies any use of drugs or ETOH.  Patients BAL is <11.  Patients UDS is positive for Benzodiazepines and Tetrahydrocannabinol.  Patient denies SI/HI.  Patient denies psychosis.  Patient reports that she is able to contract for safety and will be willing to participate in Intensive Outpatient Therapy   Axis I: Bipolar, Depressed and Opoid Dependence  Axis II: Deferred Axis III:  Past Medical History  Diagnosis Date  . Anxiety   . Reflex sympathetic dystrophy     Axis IV: economic problems, housing problems, occupational problems, other psychosocial or environmental problems, problems related to social environment, problems with access to health care services and problems with primary support group Axis V: 31-40 impairment in reality testing  Past Medical History:  Past Medical History  Diagnosis Date  . Anxiety   . Reflex sympathetic dystrophy     Past Surgical History  Procedure Laterality Date  . Neck surgery    . Back surgery    . Abdominal hysterectomy      partial    Family History: No family history on file.  Social History:  reports that she has been smoking.  She has never used smokeless tobacco. She reports that she uses illicit drugs. She reports that she does not drink alcohol.  Additional Social History:     CIWA: CIWA-Ar BP: 108/71 mmHg Pulse Rate: 70 COWS:    Allergies:  Allergies  Allergen Reactions  . Darvocet (Propoxyphene-Acetaminophen) Hives    Home Medications:  (Not in a hospital admission)  OB/GYN Status:  No LMP recorded. Patient has had a hysterectomy.  General Assessment Data Location of Assessment: [facility] ED ACT Assessment: Yes Living Arrangements: Parent Can pt return to current living arrangement?: Yes Admission Status: Voluntary Is patient capable of signing voluntary admission?: Yes Transfer from: Acute Hospital Referral Source: Self/Family/Friend  Education Status Is patient currently in school?: No  Risk to self Suicidal Ideation: No Suicidal Intent: No Is patient at risk for suicide?: No Suicidal Plan?: No Access  to Means: No What has been your use of drugs/alcohol within the last 12 months?: Opiates  Previous Attempts/Gestures: No How many times?: 0 Other Self Harm Risks: None Triggers for Past Attempts: Family contact Intentional Self Injurious Behavior: None Family Suicide History: No Recent stressful life event(s): Conflict (Comment);Job Loss;Financial Problems;Other  (Comment) Persecutory voices/beliefs?: Yes Depression: Yes Depression Symptoms: Despondent;Insomnia;Tearfulness;Isolating;Fatigue;Guilt;Loss of interest in usual pleasures;Feeling worthless/self pity Substance abuse history and/or treatment for substance abuse?: Yes Suicide prevention information given to non-admitted patients: Not applicable  Risk to Others Homicidal Ideation: No Thoughts of Harm to Others: No Current Homicidal Intent: No Current Homicidal Plan: No Access to Homicidal Means: No Identified Victim: None History of harm to others?: No Assessment of Violence: None Noted Violent Behavior Description: None Does patient have access to weapons?: No Criminal Charges Pending?: No Does patient have a court date: No  Psychosis Hallucinations: None noted Delusions: None noted  Mental Status Report Appear/Hygiene: Disheveled Eye Contact: Fair Motor Activity: Freedom of movement Speech: Logical/coherent Level of Consciousness: Alert Mood: Depressed Affect: Depressed Anxiety Level: Minimal Thought Processes: Coherent;Relevant Judgement: Unimpaired Orientation: Person;Place;Time;Situation Obsessive Compulsive Thoughts/Behaviors: None  Cognitive Functioning Concentration: Decreased Memory: Recent Intact;Remote Intact IQ: Average Insight: Fair Impulse Control: Poor Appetite: Fair Weight Loss: 0 Weight Gain: 0 Sleep: Decreased Total Hours of Sleep: 5 Vegetative Symptoms: None  ADLScreening [facility] Assessment Services) Patient's cognitive ability adequate to safely complete daily activities?: Yes Patient able to express need for assistance with ADLs?: Yes Independently performs ADLs?: Yes (appropriate for developmental age)  Abuse/Neglect [facility]) Physical Abuse: Denies Verbal Abuse: Denies Sexual Abuse: Denies  Prior Inpatient Therapy Prior Inpatient Therapy: No Prior Therapy Dates: N/A Prior Therapy Facilty/Provider(s): N/A Reason for Treatment: N/A  Prior  Outpatient Therapy Prior Outpatient Therapy: Yes Prior Therapy Dates: ongoing current  Prior Therapy Facilty/Provider(s): Family Doctor  Reason for Treatment: Medication Management   ADL Screening (condition at time of admission) Patient's cognitive ability adequate to safely complete daily activities?: Yes Patient able to express need for assistance with ADLs?: Yes Independently performs ADLs?: Yes (appropriate for developmental age)       Abuse/Neglect Assessment (Assessment to be complete while patient is alone) Physical Abuse: Denies Verbal Abuse: Denies Sexual Abuse: Denies Values / Beliefs Cultural Requests During Hospitalization: None Spiritual Requests During Hospitalization: None        Additional Information 1:1 In Past 12 Months?: No CIRT Risk: No Elopement Risk: No Does patient have medical clearance?: Yes     Disposition: Pending consult with psychiatrist in the morning.  Disposition Initial Assessment Completed for this Encounter: Yes Disposition of Patient: Referred to Patient referred to: Other (Comment)  On Site Evaluation by:   Reviewed with Physician:     Phillip Heal LaVerne 09/08/2012 3:15 AM

## 2012-09-08 NOTE — Progress Notes (Signed)
[facility] MD Progress Note  09/08/2012 11:41 AM [name]  MRN:  [id number] Subjective:  Patient states that she is feeling better.  "I was suicidal when I first came in cause I didn't know what to do.  I just want to get my life together and get off of the pain medicine.  I'm just having mood swings and don't know how to control.  My biggest stressor in moving in with my uncle and him trying to be a father to my kids which I don't want."   Diagnosis:  Axis I: Generalized Anxiety Disorder and Substance Abuse  ADL's:  Intact  Sleep: Good  Appetite:  Good  Suicidal Ideation:  NO Homicidal Ideation:  No AEB (as evidenced by):Patient denies SI, HI, AVH.  Patient states that she is able to contract for safety.  Patient wants referral to pain mgmt in order to relieve pain in other methods besides pills.  Patient also interested in out patient treatment.   Spoke to patient primary physician (Dr. Betsy Coder) and is referring patient to Dr. Robynn Pane for pain mgmt.  Psychiatric Specialty Exam: Review of Systems  Musculoskeletal: Positive for back pain and joint pain.  Psychiatric/Behavioral: Positive for depression and substance abuse ("I am prescribed Roxi by my doctor; I was using correctly until I was introduced another way (snorting)"). Negative for suicidal ideas and hallucinations. The patient is nervous/anxious.        Patient states that she just wants to get her life together and come off of the pain medications.    All other systems reviewed and are negative.    Blood pressure 109/72, pulse 82, temperature 98.9 F (37.2 C), temperature source Oral, resp. rate 18, SpO2 98.00%.There is no height or weight on file to calculate BMI.  General Appearance: Casual and Disheveled  Eye Contact::  Good  Speech:  Clear and Coherent and Normal Rate  Volume:  Normal  Mood:  Depressed  Affect:  Appropriate  Thought Process:  Circumstantial and Goal Directed  Orientation:  Full (Time, Place, and  Person)  Thought Content:  WDL  Suicidal Thoughts:  No  Homicidal Thoughts:  No  Memory:  Immediate;   Good Recent;   Good Remote;   Good  Judgement:  Fair  Insight:  Good  Psychomotor Activity:  Normal  Concentration:  Fair  Recall:  Good  Akathisia:  No  Handed:  Right  AIMS (if indicated):     Assets:  Communication Skills Desire for Improvement Housing  Sleep:      Current Medications: Current Facility-Administered Medications  Medication Dose Route Frequency Provider Last Rate Last Dose  . acetaminophen (TYLENOL) tablet 650 mg  650 mg Oral Q4H PRN Raeford Razor, MD      . alum & mag hydroxide-simeth (MAALOX/MYLANTA) 200-200-20 MG/5ML suspension 30 mL  30 mL Oral PRN Raeford Razor, MD      . dicyclomine (BENTYL) tablet 20 mg  20 mg Oral Q6H PRN Raeford Razor, MD      . hydrOXYzine (ATARAX/VISTARIL) tablet 25 mg  25 mg Oral Q6H PRN Raeford Razor, MD   25 mg at 09/08/12 0850  . ibuprofen (ADVIL,MOTRIN) tablet 600 mg  600 mg Oral Q8H PRN Raeford Razor, MD      . loperamide (IMODIUM) capsule 2-4 mg  2-4 mg Oral PRN Raeford Razor, MD      . LORazepam (ATIVAN) tablet 1 mg  1 mg Oral Q8H PRN Raeford Razor, MD      . methocarbamol (  ROBAXIN) tablet 500 mg  500 mg Oral Q8H PRN Raeford Razor, MD      . naproxen (NAPROSYN) tablet 500 mg  500 mg Oral BID PRN Raeford Razor, MD      . nicotine (NICODERM CQ - dosed in mg/24 hours) patch 21 mg  21 mg Transdermal Daily Nathan R. Pickering, MD   21 mg at 09/08/12 1108  . ondansetron (ZOFRAN-ODT) disintegrating tablet 4 mg  4 mg Oral Q6H PRN Raeford Razor, MD      . zolpidem [facility]) tablet 5 mg  5 mg Oral QHS PRN Raeford Razor, MD       Current Outpatient Prescriptions  Medication Sig Dispense Refill  . alprazolam (XANAX) 2 MG tablet Take 2 mg by mouth 4 (four) times daily as needed for anxiety.      . citalopram (CELEXA) 40 MG tablet Take 40 mg by mouth daily.      Marland Kitchen oxyCODONE (ROXICODONE) 15 MG immediate release tablet Take 15 mg by mouth  at bedtime as needed for pain.      Marland Kitchen oxycodone (ROXICODONE) 30 MG immediate release tablet Take 30 mg by mouth every 4 (four) hours as needed for pain.      . traZODone (DESYREL) 50 MG tablet Take 50-100 mg by mouth at bedtime as needed for sleep.        Lab Results:  Results for orders placed during the hospital encounter of 09/07/12 (from the past 48 hour(s))  ACETAMINOPHEN LEVEL     Status: None   Collection Time    09/07/12  5:10 PM      Result Value Range   Acetaminophen (Tylenol), Serum <15.0  10 - 30 ug/mL   Comment:            THERAPEUTIC CONCENTRATIONS VARY     SIGNIFICANTLY. A RANGE OF 10-30     ug/mL MAY BE AN EFFECTIVE     CONCENTRATION FOR MANY PATIENTS.     HOWEVER, SOME ARE BEST TREATED     AT CONCENTRATIONS OUTSIDE THIS     RANGE.     ACETAMINOPHEN CONCENTRATIONS     >150 ug/mL AT 4 HOURS AFTER     INGESTION AND >50 ug/mL AT 12     HOURS AFTER INGESTION ARE     OFTEN ASSOCIATED WITH TOXIC     REACTIONS.  CBC     Status: Abnormal   Collection Time    09/07/12  5:10 PM      Result Value Range   WBC 12.8 (*) 4.0 - 10.5 K/uL   RBC 4.52  3.87 - 5.11 MIL/uL   Hemoglobin 14.0  12.0 - 15.0 g/dL   HCT 16.1  09.6 - 04.5 %   MCV 89.4  78.0 - 100.0 fL   MCH 31.0  26.0 - 34.0 pg   MCHC 34.7  30.0 - 36.0 g/dL   RDW 40.9  81.1 - 91.4 %   Platelets 383  150 - 400 K/uL  COMPREHENSIVE METABOLIC PANEL     Status: Abnormal   Collection Time    09/07/12  5:10 PM      Result Value Range   Sodium 137  135 - 145 mEq/L   Potassium 2.7 (*) 3.5 - 5.1 mEq/L   Comment: CRITICAL RESULT CALLED TO, READ BACK BY AND VERIFIED WITH:     [id number]   Chloride 99  96 - 112 mEq/L   CO2 25  19 - 32 mEq/L   Glucose, Bld 97  70 - 99 mg/dL   BUN 9  6 - 23 mg/dL   Creatinine, Ser 1.61  0.50 - 1.10 mg/dL   Calcium 9.5  8.4 - 09.6 mg/dL   Total Protein 8.1  6.0 - 8.3 g/dL   Albumin 4.1  3.5 - 5.2 g/dL   AST 14  0 - 37 U/L   ALT 7  0 - 35 U/L   Alkaline Phosphatase 127  (*) 39 - 117 U/L   Total Bilirubin 0.4  0.3 - 1.2 mg/dL   GFR calc non Af Amer 87 (*) >90 mL/min   GFR calc Af Amer >90  >90 mL/min   Comment:            The eGFR has been calculated     using the CKD EPI equation.     This calculation has not been     validated in all clinical     situations.     eGFR's persistently     <90 mL/min signify     possible Chronic Kidney Disease.  ETHANOL     Status: None   Collection Time    09/07/12  5:10 PM      Result Value Range   Alcohol, Ethyl (B) <11  0 - 11 mg/dL   Comment:            LOWEST DETECTABLE LIMIT FOR     SERUM ALCOHOL IS 11 mg/dL     FOR MEDICAL PURPOSES ONLY  SALICYLATE LEVEL     Status: Abnormal   Collection Time    09/07/12  5:10 PM      Result Value Range   Salicylate Lvl <2.0 (*) 2.8 - 20.0 mg/dL  MAGNESIUM     Status: None   Collection Time    09/07/12  5:10 PM      Result Value Range   Magnesium 2.1  1.5 - 2.5 mg/dL  URINE RAPID DRUG SCREEN (HOSP PERFORMED)     Status: Abnormal   Collection Time    09/07/12  5:12 PM      Result Value Range   Opiates NONE DETECTED  NONE DETECTED   Cocaine NONE DETECTED  NONE DETECTED   Benzodiazepines POSITIVE (*) NONE DETECTED   Amphetamines NONE DETECTED  NONE DETECTED   Tetrahydrocannabinol POSITIVE (*) NONE DETECTED   Barbiturates NONE DETECTED  NONE DETECTED   Comment:            DRUG SCREEN FOR MEDICAL PURPOSES     ONLY.  IF CONFIRMATION IS NEEDED     FOR ANY PURPOSE, NOTIFY LAB     WITHIN 5 DAYS.                LOWEST DETECTABLE LIMITS     FOR URINE DRUG SCREEN     Drug Class       Cutoff (ng/mL)     Amphetamine      1000     Barbiturate      200     Benzodiazepine   200     Tricyclics       300     Opiates          300     Cocaine          300     THC              50  POCT PREGNANCY, URINE     Status: None   Collection Time  09/07/12  5:15 PM      Result Value Range   Preg Test, Ur NEGATIVE  NEGATIVE   Comment:            THE SENSITIVITY OF THIS      METHODOLOGY IS >24 mIU/mL  GLUCOSE, CAPILLARY     Status: Abnormal   Collection Time    09/07/12  9:38 PM      Result Value Range   Glucose-Capillary 131 (*) 70 - 99 mg/dL    Physical Findings: AIMS:  , ,  ,  ,    CIWA:    COWS:     Treatment Plan Summary: Discharge home with resources and follow up with primary physician  Plan:  Face to Face interview and consult with Dr. Lolly Mustache.  Plan is to discharge home with out patient services with [facility] in [place] [phone]  Medical Decision Making Problem Points:  Established problem, stable/improving (1), Review of last therapy session (1) and Review of psycho-social stressors (1) Data Points:  Order Aims Assessment (2) Review and summation of old records (2) Review of medication regiment & side effects (2)  I certify that inpatient services furnished can reasonably be expected to improve the patient's condition.   Rankin, Shuvon FNP-BC 09/08/2012, 11:41 AM I personally seen the patient agreed with the findings and involved in the treatment plan.

## 2012-09-08 NOTE — ED Provider Notes (Signed)
History    41yf with depression. Feels overwhelmed. Ongoing issues for years. Not sure why acutely worse recently. Denies SI or HI. Abuses opiates. Last used about 3 days ago. Feels like withdrawal may be contributing but not sole reason for the way she is feeling. Denies ingestion. Denies hx of suicide attempt. On xanax for history of anxiety and celexa for depression. Reports compliance.   CSN: [id number]  Arrival date & time 09/07/12  1550   First MD Initiated Contact with Patient 09/07/12 1655      Chief Complaint  Patient presents with  . Medical Clearance    (Consider location/radiation/quality/duration/timing/severity/associated sxs/prior treatment) HPI  Past Medical History  Diagnosis Date  . Anxiety   . Reflex sympathetic dystrophy     Past Surgical History  Procedure Laterality Date  . Neck surgery    . Back surgery    . Abdominal hysterectomy      partial    No family history on file.  History  Substance Use Topics  . Smoking status: Current Every Day Smoker -- 1.00 packs/day for 20 years  . Smokeless tobacco: Never Used  . Alcohol Use: No    OB History   Grav Para Term Preterm Abortions TAB SAB Ect Mult Living                  Review of Systems All systems reviewed and negative, other than as noted in HPI.  Allergies  Darvocet  Home Medications   Current Outpatient Rx  Name  Route  Sig  Dispense  Refill  . alprazolam (XANAX) 2 MG tablet   Oral   Take 2 mg by mouth 4 (four) times daily as needed for anxiety.         . citalopram (CELEXA) 40 MG tablet   Oral   Take 40 mg by mouth daily.         Marland Kitchen oxyCODONE (ROXICODONE) 15 MG immediate release tablet   Oral   Take 15 mg by mouth at bedtime as needed for pain.         Marland Kitchen oxycodone (ROXICODONE) 30 MG immediate release tablet   Oral   Take 30 mg by mouth every 4 (four) hours as needed for pain.         . traZODone (DESYREL) 50 MG tablet   Oral   Take 50-100 mg by mouth at bedtime  as needed for sleep.           BP 108/71  Pulse 70  Temp(Src) 98.5 F (36.9 C) (Oral)  Resp 18  SpO2 98%  Physical Exam  Nursing note and vitals reviewed. Constitutional: She is oriented to person, place, and time. She appears well-developed and well-nourished. No distress.  HENT:  Head: Normocephalic and atraumatic.  Eyes: Conjunctivae are normal. Right eye exhibits no discharge. Left eye exhibits no discharge.  Neck: Neck supple.  Cardiovascular: Normal rate, regular rhythm and normal heart sounds.  Exam reveals no gallop and no friction rub.   No murmur heard. Pulmonary/Chest: Effort normal and breath sounds normal. No respiratory distress.  Abdominal: Soft. She exhibits no distension. There is no tenderness.  Musculoskeletal: She exhibits no edema and no tenderness.  Neurological: She is alert and oriented to person, place, and time. No cranial nerve deficit. She exhibits normal muscle tone. Coordination normal.  Skin: Skin is warm and dry.  Psychiatric:  Anxious. Fidgeting. Crying at times.     ED Course  Procedures (including critical care time)  Labs Reviewed  CBC - Abnormal; Notable for the following:    WBC 12.8 (*)    All other components within normal limits  COMPREHENSIVE METABOLIC PANEL - Abnormal; Notable for the following:    Potassium 2.7 (*)    Alkaline Phosphatase 127 (*)    GFR calc non Af Amer 87 (*)    All other components within normal limits  SALICYLATE LEVEL - Abnormal; Notable for the following:    Salicylate Lvl <2.0 (*)    All other components within normal limits  URINE RAPID DRUG SCREEN (HOSP PERFORMED) - Abnormal; Notable for the following:    Benzodiazepines POSITIVE (*)    Tetrahydrocannabinol POSITIVE (*)    All other components within normal limits  GLUCOSE, CAPILLARY - Abnormal; Notable for the following:    Glucose-Capillary 131 (*)    All other components within normal limits  ACETAMINOPHEN LEVEL  ETHANOL  MAGNESIUM  POCT  PREGNANCY, URINE   No results found.   1. Depression   2. Anxiety   3. Drug abuse       MDM  41yF with anxiety and opiate abuse. Increasing depression. No SI/HI or psychosis. Hypokalemic which was repleted. Medically cleared. Psych eval.         Raeford Razor, MD 09/08/12 [phone]
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
## 2012-09-14 DIAGNOSIS — Z681 Body mass index (BMI) 19 or less, adult: Secondary | ICD-10-CM | POA: Diagnosis not present

## 2012-09-14 DIAGNOSIS — L659 Nonscarring hair loss, unspecified: Secondary | ICD-10-CM | POA: Diagnosis not present

## 2012-09-14 DIAGNOSIS — E876 Hypokalemia: Secondary | ICD-10-CM | POA: Diagnosis not present

## 2012-09-14 DIAGNOSIS — Z6828 Body mass index (BMI) 28.0-28.9, adult: Secondary | ICD-10-CM | POA: Diagnosis not present

## 2012-09-14 DIAGNOSIS — Z79899 Other long term (current) drug therapy: Secondary | ICD-10-CM | POA: Diagnosis not present

## 2012-09-14 DIAGNOSIS — K219 Gastro-esophageal reflux disease without esophagitis: Secondary | ICD-10-CM | POA: Diagnosis not present

## 2012-09-15 DIAGNOSIS — S62309A Unspecified fracture of unspecified metacarpal bone, initial encounter for closed fracture: Secondary | ICD-10-CM | POA: Diagnosis not present

## 2012-10-15 DIAGNOSIS — F411 Generalized anxiety disorder: Secondary | ICD-10-CM | POA: Diagnosis not present

## 2012-10-15 DIAGNOSIS — M479 Spondylosis, unspecified: Secondary | ICD-10-CM | POA: Diagnosis not present

## 2012-10-15 DIAGNOSIS — G8929 Other chronic pain: Secondary | ICD-10-CM | POA: Diagnosis not present

## 2012-10-15 DIAGNOSIS — Z6828 Body mass index (BMI) 28.0-28.9, adult: Secondary | ICD-10-CM | POA: Diagnosis not present

## 2012-10-15 DIAGNOSIS — F329 Major depressive disorder, single episode, unspecified: Secondary | ICD-10-CM | POA: Diagnosis not present

## 2012-10-21 ENCOUNTER — Other Ambulatory Visit (HOSPITAL_COMMUNITY): Payer: Self-pay | Admitting: Internal Medicine

## 2012-10-21 DIAGNOSIS — M479 Spondylosis, unspecified: Secondary | ICD-10-CM

## 2012-10-23 ENCOUNTER — Ambulatory Visit (HOSPITAL_COMMUNITY): Admission: RE | Admit: 2012-10-23 | Payer: Medicare Other | Source: Ambulatory Visit

## 2012-10-26 ENCOUNTER — Encounter (HOSPITAL_COMMUNITY): Payer: Self-pay

## 2012-10-26 ENCOUNTER — Ambulatory Visit (HOSPITAL_COMMUNITY)
Admission: RE | Admit: 2012-10-26 | Discharge: 2012-10-26 | Disposition: A | Discharge status: Home / Self Care | Payer: Medicare Other | Source: Ambulatory Visit | Attending: Internal Medicine | Admitting: Internal Medicine

## 2012-10-26 DIAGNOSIS — Z981 Arthrodesis status: Secondary | ICD-10-CM | POA: Insufficient documentation

## 2012-10-26 DIAGNOSIS — M47812 Spondylosis without myelopathy or radiculopathy, cervical region: Secondary | ICD-10-CM | POA: Diagnosis not present

## 2012-10-26 DIAGNOSIS — R209 Unspecified disturbances of skin sensation: Secondary | ICD-10-CM | POA: Diagnosis not present

## 2012-10-26 DIAGNOSIS — M5380 Other specified dorsopathies, site unspecified: Secondary | ICD-10-CM | POA: Diagnosis not present

## 2012-10-26 DIAGNOSIS — M542 Cervicalgia: Secondary | ICD-10-CM | POA: Insufficient documentation

## 2012-10-26 DIAGNOSIS — M479 Spondylosis, unspecified: Secondary | ICD-10-CM

## 2012-11-13 DIAGNOSIS — Z6828 Body mass index (BMI) 28.0-28.9, adult: Secondary | ICD-10-CM | POA: Diagnosis not present

## 2012-11-13 DIAGNOSIS — G8929 Other chronic pain: Secondary | ICD-10-CM | POA: Diagnosis not present

## 2012-11-23 DIAGNOSIS — G43909 Migraine, unspecified, not intractable, without status migrainosus: Secondary | ICD-10-CM | POA: Diagnosis not present

## 2012-11-23 DIAGNOSIS — F17200 Nicotine dependence, unspecified, uncomplicated: Secondary | ICD-10-CM | POA: Diagnosis not present

## 2012-11-23 DIAGNOSIS — R51 Headache: Secondary | ICD-10-CM | POA: Diagnosis not present

## 2012-11-23 DIAGNOSIS — I509 Heart failure, unspecified: Secondary | ICD-10-CM | POA: Diagnosis not present

## 2012-11-23 DIAGNOSIS — N390 Urinary tract infection, site not specified: Secondary | ICD-10-CM | POA: Diagnosis not present

## 2012-11-23 DIAGNOSIS — Z79899 Other long term (current) drug therapy: Secondary | ICD-10-CM | POA: Diagnosis not present

## 2012-12-07 DIAGNOSIS — N390 Urinary tract infection, site not specified: Secondary | ICD-10-CM | POA: Diagnosis not present

## 2012-12-07 DIAGNOSIS — I509 Heart failure, unspecified: Secondary | ICD-10-CM | POA: Diagnosis not present

## 2012-12-07 DIAGNOSIS — R51 Headache: Secondary | ICD-10-CM | POA: Diagnosis not present

## 2012-12-07 DIAGNOSIS — F17200 Nicotine dependence, unspecified, uncomplicated: Secondary | ICD-10-CM | POA: Diagnosis not present

## 2012-12-07 DIAGNOSIS — Z79899 Other long term (current) drug therapy: Secondary | ICD-10-CM | POA: Diagnosis not present

## 2012-12-11 DIAGNOSIS — G43009 Migraine without aura, not intractable, without status migrainosus: Secondary | ICD-10-CM | POA: Diagnosis not present

## 2012-12-11 DIAGNOSIS — G8929 Other chronic pain: Secondary | ICD-10-CM | POA: Diagnosis not present

## 2012-12-11 DIAGNOSIS — Z23 Encounter for immunization: Secondary | ICD-10-CM | POA: Diagnosis not present

## 2012-12-11 DIAGNOSIS — Z681 Body mass index (BMI) 19 or less, adult: Secondary | ICD-10-CM | POA: Diagnosis not present

## 2012-12-11 DIAGNOSIS — K219 Gastro-esophageal reflux disease without esophagitis: Secondary | ICD-10-CM | POA: Diagnosis not present

## 2012-12-16 DIAGNOSIS — F411 Generalized anxiety disorder: Secondary | ICD-10-CM | POA: Diagnosis not present

## 2012-12-16 DIAGNOSIS — G43009 Migraine without aura, not intractable, without status migrainosus: Secondary | ICD-10-CM | POA: Diagnosis not present

## 2012-12-16 DIAGNOSIS — R7309 Other abnormal glucose: Secondary | ICD-10-CM | POA: Diagnosis not present

## 2012-12-16 DIAGNOSIS — G8929 Other chronic pain: Secondary | ICD-10-CM | POA: Diagnosis not present

## 2012-12-17 ENCOUNTER — Encounter (HOSPITAL_COMMUNITY): Payer: Self-pay | Admitting: *Deleted

## 2012-12-17 ENCOUNTER — Emergency Department (HOSPITAL_COMMUNITY): Payer: Medicare Other

## 2012-12-17 ENCOUNTER — Emergency Department (HOSPITAL_COMMUNITY)
Admission: EM | Admit: 2012-12-17 | Discharge: 2012-12-17 | Disposition: A | Discharge status: Home / Self Care | Payer: Medicare Other | Attending: Emergency Medicine | Admitting: Emergency Medicine

## 2012-12-17 DIAGNOSIS — Z79899 Other long term (current) drug therapy: Secondary | ICD-10-CM | POA: Insufficient documentation

## 2012-12-17 DIAGNOSIS — F17200 Nicotine dependence, unspecified, uncomplicated: Secondary | ICD-10-CM | POA: Diagnosis not present

## 2012-12-17 DIAGNOSIS — J40 Bronchitis, not specified as acute or chronic: Secondary | ICD-10-CM | POA: Diagnosis not present

## 2012-12-17 DIAGNOSIS — R0602 Shortness of breath: Secondary | ICD-10-CM | POA: Insufficient documentation

## 2012-12-17 DIAGNOSIS — F411 Generalized anxiety disorder: Secondary | ICD-10-CM | POA: Diagnosis not present

## 2012-12-17 DIAGNOSIS — Z8669 Personal history of other diseases of the nervous system and sense organs: Secondary | ICD-10-CM | POA: Insufficient documentation

## 2012-12-17 DIAGNOSIS — R42 Dizziness and giddiness: Secondary | ICD-10-CM | POA: Insufficient documentation

## 2012-12-17 DIAGNOSIS — J209 Acute bronchitis, unspecified: Secondary | ICD-10-CM | POA: Diagnosis not present

## 2012-12-17 DIAGNOSIS — R05 Cough: Secondary | ICD-10-CM | POA: Diagnosis not present

## 2012-12-17 MED ORDER — AZITHROMYCIN 250 MG PO TABS: 250.0000 mg | ORAL_TABLET | Freq: Every day | ORAL | Status: DC

## 2012-12-17 MED ORDER — ALBUTEROL SULFATE HFA 108 (90 BASE) MCG/ACT IN AERS
2.0000 | INHALATION_SPRAY | RESPIRATORY_TRACT | Status: AC
  Administered 2012-12-17: 2 via RESPIRATORY_TRACT
  Filled 2012-12-17: qty 6.7

## 2012-12-17 MED ORDER — BENZONATATE 100 MG PO CAPS: 100.0000 mg | ORAL_CAPSULE | Freq: Three times a day (TID) | ORAL | Status: DC

## 2012-12-17 NOTE — ED Notes (Signed)
Nonproductive cough. Dizziness. SOB at times. NAD at this time. Symptoms x 3 days. Currently on antibiotic for UTI.

## 2012-12-17 NOTE — ED Provider Notes (Signed)
CSN: [id number]     Arrival date & time 12/17/12  1300 History   First MD Initiated Contact with Patient 12/17/12 1434     Chief Complaint  Patient presents with  . Cough  . Dizziness  . Shortness of Breath   (Consider location/radiation/quality/duration/timing/severity/associated sxs/prior Treatment) HPI Comments: 42 year old female with a history of smoking, history of anxiety and a history of reflux and pathetic dystrophy who presents with a complaint of coughing and shortness of breath. She states that she has had approximately 6 days of coughing which is persistent, nonproductive and associated with a heaviness when she coughs, shortness of breath. She denies any fevers or chills, there has been no swelling, she is currently taking an antibiotic for her urinary infection which she was told by her family doctor that she had. She denies any blurred vision though her family members stating that she is having occasional involuntary jerking movements and can't hold her eyes (much. Nothing seems to make this better.  Patient is a 42 y.o. female presenting with cough and shortness of breath. The history is provided by the patient and a relative.  Cough Associated symptoms: shortness of breath   Shortness of Breath Associated symptoms: cough     Past Medical History  Diagnosis Date  . Anxiety   . Reflex sympathetic dystrophy    Past Surgical History  Procedure Laterality Date  . Neck surgery    . Back surgery    . Abdominal hysterectomy      partial   No family history on file. History  Substance Use Topics  . Smoking status: Current Every Day Smoker -- 1.00 packs/day for 20 years    Types: Cigarettes  . Smokeless tobacco: Never Used  . Alcohol Use: No   OB History   Grav Para Term Preterm Abortions TAB SAB Ect Mult Living                 Review of Systems  Respiratory: Positive for cough and shortness of breath.   All other systems reviewed and are  negative.    Allergies  Darvocet  Home Medications   Current Outpatient Rx  Name  Route  Sig  Dispense  Refill  . alprazolam (XANAX) 2 MG tablet   Oral   Take 2 mg by mouth 4 (four) times daily as needed for anxiety.         . benzonatate (TESSALON) 100 MG capsule   Oral   Take 1 capsule (100 mg total) by mouth every 8 (eight) hours.   21 capsule   0   . citalopram (CELEXA) 40 MG tablet   Oral   Take 40 mg by mouth daily.         Marland Kitchen oxyCODONE (ROXICODONE) 15 MG immediate release tablet   Oral   Take 15 mg by mouth at bedtime as needed for pain.         Marland Kitchen oxycodone (ROXICODONE) 30 MG immediate release tablet   Oral   Take 30 mg by mouth every 4 (four) hours as needed for pain.         . traZODone (DESYREL) 50 MG tablet   Oral   Take 50-100 mg by mouth at bedtime as needed for sleep.          BP 119/67  Pulse 94  Temp(Src) 98.3 F (36.8 C) (Oral)  Resp 12  SpO2 98% Physical Exam  Nursing note and vitals reviewed. Constitutional: She appears well-developed and well-nourished. No  distress.  HENT:  Head: Normocephalic and atraumatic.  Mouth/Throat: Oropharynx is clear and moist. No oropharyngeal exudate.  Tympanic membranes are clear, oropharynx is clear, no exudate asymmetry hypertrophy or erythema. Nasal passages are clear. Phonation is normal.  Eyes: Conjunctivae and EOM are normal. Pupils are equal, round, and reactive to light. Right eye exhibits no discharge. Left eye exhibits no discharge. No scleral icterus.  Neck: Normal range of motion. Neck supple. No JVD present. No thyromegaly present.  Cardiovascular: Normal rate, regular rhythm, normal heart sounds and intact distal pulses.  Exam reveals no gallop and no friction rub.   No murmur heard. Pulmonary/Chest: Effort normal. No respiratory distress. She has wheezes. She has no rales.  Diffuse mild expiratory wheezing, speaks in full sentences, no accessory muscle use, no increased work of breathing.   Abdominal: Soft. Bowel sounds are normal. She exhibits no distension and no mass. There is no tenderness.  Musculoskeletal: Normal range of motion. She exhibits no edema and no tenderness.  Lymphadenopathy:    She has no cervical adenopathy.  Neurological: She is alert. Coordination normal.  Skin: Skin is warm and dry. No rash noted. No erythema.  Psychiatric: She has a normal mood and affect. Her behavior is normal.    ED Course  Procedures (including critical care time) Labs Review Labs Reviewed - No data to display Imaging Review Dg Chest 2 View  12/17/2012   CLINICAL DATA:  Cough, dizziness, shortness of breast.  EXAM: CHEST  2 VIEW  COMPARISON:  01/17/2012  FINDINGS: The heart size and mediastinal contours are within normal limits. Both lungs are clear. The visualized skeletal structures are unremarkable.  IMPRESSION: No active cardiopulmonary disease.   Electronically Signed   By: Charlett Nose M.D.   On: 12/17/2012 13:28    MDM   1. Bronchitis    The patient has no involuntary jerking on my exam, she is alert and oriented and answering all questions appropriately. She is able to open her eyes fully without any difficulty. She does appear to have a respiratory illness which is likely a bronchitis. Her chest x-ray reveals no signs of acute infiltrates, she is not febrile tachycardic nor is she hypotensive. She will be prescribed an albuterol MDI inhaler as well as an antitussive.  Meds given in ED:  Medications  albuterol (PROVENTIL HFA;VENTOLIN HFA) 108 (90 BASE) MCG/ACT inhaler 2 puff (not administered)    New Prescriptions   BENZONATATE (TESSALON) 100 MG CAPSULE    Take 1 capsule (100 mg total) by mouth every 8 (eight) hours.        Vida Roller, MD 12/17/12 [phone]

## 2012-12-17 NOTE — ED Notes (Signed)
EDP talking with pt in triage

## 2012-12-23 DIAGNOSIS — Z6830 Body mass index (BMI) 30.0-30.9, adult: Secondary | ICD-10-CM | POA: Diagnosis not present

## 2012-12-23 DIAGNOSIS — R609 Edema, unspecified: Secondary | ICD-10-CM | POA: Diagnosis not present

## 2012-12-23 DIAGNOSIS — N390 Urinary tract infection, site not specified: Secondary | ICD-10-CM | POA: Diagnosis not present

## 2013-01-08 DIAGNOSIS — Z6827 Body mass index (BMI) 27.0-27.9, adult: Secondary | ICD-10-CM | POA: Diagnosis not present

## 2013-01-08 DIAGNOSIS — L02828 Furuncle of other sites: Secondary | ICD-10-CM | POA: Diagnosis not present

## 2013-01-08 DIAGNOSIS — G8929 Other chronic pain: Secondary | ICD-10-CM | POA: Diagnosis not present

## 2013-01-21 DIAGNOSIS — L723 Sebaceous cyst: Secondary | ICD-10-CM | POA: Diagnosis not present

## 2013-01-22 ENCOUNTER — Encounter (HOSPITAL_COMMUNITY): Payer: Self-pay

## 2013-01-22 ENCOUNTER — Encounter (HOSPITAL_COMMUNITY)
Admission: RE | Admit: 2013-01-22 | Discharge: 2013-01-22 | Disposition: A | Discharge status: Home / Self Care | Payer: Medicare Other | Source: Ambulatory Visit | Attending: General Surgery | Admitting: General Surgery

## 2013-01-22 DIAGNOSIS — Z01812 Encounter for preprocedural laboratory examination: Secondary | ICD-10-CM | POA: Diagnosis not present

## 2013-01-22 DIAGNOSIS — Z01818 Encounter for other preprocedural examination: Secondary | ICD-10-CM | POA: Diagnosis not present

## 2013-01-22 HISTORY — DX: Other chronic pain: G89.29

## 2013-01-22 HISTORY — DX: Cervicalgia: M54.2

## 2013-01-22 HISTORY — DX: Dorsalgia, unspecified: M54.9

## 2013-01-22 LAB — CBC WITH DIFFERENTIAL/PLATELET
Basophils Relative: 0 % (ref 0–1)
Hemoglobin: 12.6 g/dL (ref 12.0–15.0)
Lymphs Abs: 3.6 10*3/uL (ref 0.7–4.0)
MCHC: 33.7 g/dL (ref 30.0–36.0)
Monocytes Relative: 6 % (ref 3–12)
Neutro Abs: 6 10*3/uL (ref 1.7–7.7)
Neutrophils Relative %: 57 % (ref 43–77)
RBC: 4.06 MIL/uL (ref 3.87–5.11)

## 2013-01-22 LAB — BASIC METABOLIC PANEL
GFR calc Af Amer: >90 mL/min (ref >90)
GFR calc non Af Amer: 78 mL/min — ABNORMAL LOW (ref >90)
Potassium: 3.2 mEq/L — ABNORMAL LOW (ref 3.5–5.1)
Sodium: 140 mEq/L (ref 135–145)

## 2013-01-22 NOTE — H&P (Signed)
  NTS SOAP Note  Vital Signs:  Vitals as of: 01/21/2013: Systolic 117: Diastolic 75: Heart Rate 115: Temp 99.54F: Height 40ft 10in: Weight 191Lbs 0 Ounces: BMI 27.41  BMI : 27.41 kg/m2  Subjective: This 42 Years 3 Months old Female presents for sebaceous cysts on scalp and right axilla.  Both have been present for some time now, but increasing in size, and causing discomfort.  Has had some drainage from axillary cyst.   Review of Symptoms:  Constitutional:unremarkable   Head:unremarkable    Eyes:unremarkable  negative Nose/Mouth/Throat:unremarkable Cardiovascular:  unremarkable   Respiratory:unremarkable   Gastrointestinal:  unremarkable   Genitourinary:unremarkable       back Hematolgic/Lymphatic:unremarkable     Allergic/Immunologic:unremarkable     Past Medical History:    Reviewed   Past Medical History  Surgical History: back fusion, neck fusion, TAH Medical Problems: chronic pain Psychiatric History:  Anxiety Allergies: nkda Medications: oxycodone, topamax, adapex, xanax   Social History:Reviewed  Social History  Preferred Language: English Race:  White Ethnicity: Hispanic / Latino Age: 42 Years 3 Months Marital Status:  D Alcohol:  No Recreational drug(s):  No   Smoking Status: Current every day smoker reviewed on 01/21/2013 Started Date: 04/01/1990 Packs per day: 0.50 Functional Status reviewed on mm/dd/yyyy ------------------------------------------------ Bathing: Normal Cooking: Normal Dressing: Normal Driving: Normal Eating: Normal Managing Meds: Normal Oral Care: Normal Shopping: Normal Toileting: Normal Transferring: Normal Walking: Normal Cognitive Status reviewed on mm/dd/yyyy ------------------------------------------------ Attention: Normal Decision Making: Normal Language: Normal Memory: Normal Motor: Normal Perception: Normal Problem Solving: Normal Visual and Spatial: Normal   Family  History:  Reviewed  Family Health History Mother, Living; Healthy; healthy Father, Living; Parkinson's disease;     Objective Information: General:  Well appearing, well nourished in no distress.      sebaceous cysts in right posterior scalp and right axilla.  Both mobile with punctum present.  Not currently draining. Heart:  RRR, no murmur Lungs:    CTA bilaterally, no wheezes, rhonchi, rales.  Breathing unlabored.  Assessment:Sebaceous cysts, scalp and right axilla  Diagnosis &amp; Procedure Smart Code   Plan:Scheduled for excicsion of sebaceous cyst, scalp and right axilla on 01/27/13.   Patient Education:Alternative treatments to surgery were discussed with patient (and family).  Risks and benefits  of procedure were fully explained to the patient (and family) who gave informed consent. Patient/family questions were addressed.  Follow-up:Pending Surgery

## 2013-01-22 NOTE — Patient Instructions (Signed)
Ayodele L Mcguirk  01/22/2013   Your procedure is scheduled on:  01/27/2013  Report to [facility] at  615  AM.  Call this number if you have problems the morning of surgery: [phone]   Remember:   Do not eat food or drink liquids after midnight.   Take these medicines the morning of surgery with A SIP OF WATER: xanax, celexa, roxicodone  Do not wear jewelry, make-up or nail polish.  Do not wear lotions, powders, or perfumes.   Do not shave 48 hours prior to surgery. Men may shave face and neck.  Do not bring valuables to the hospital.  [facility] is not responsible for any belongings or valuables.               Contacts, dentures or bridgework may not be worn into surgery.  Leave suitcase in the car. After surgery it may be brought to your room.  For patients admitted to the hospital, discharge time is determined by your  treatment team.               Patients discharged the day of surgery will not be allowed to drive home.  Name and phone number of your driver: family  Special Instructions: Shower using CHG 2 nights before surgery and the night before surgery.  If you shower the day of surgery use CHG.  Use special wash - you have one bottle of CHG for all showers.  You should use approximately 1/3 of the bottle for each shower.   Please read over the following fact sheets that you were given: Pain Booklet, Coughing and Deep Breathing, Surgical Site Infection Prevention, Anesthesia Post-op Instructions and Care and Recovery After Surgery Cyst Removal Your caregiver has removed a cyst. A cyst is a sac containing a semi-solid material. Cysts may occur any place on your body. They may remain small for years or gradually get larger. A sebaceous cyst is an enlarged (dilated) sweat gland filled with old sweat (sebum). Unattended, these may become large (the size of a softball) over several years time. These are often removed for improved appearance (cosmetic) reasons or before  they become infected to form an abscess. An abscess is an infected cyst. HOME CARE INSTRUCTIONS   Keep your bandage clean and dry. You may change your bandage after 24 hours. If your bandage sticks, use warm water to gently loosen it. Pat the area dry with a clean towel before putting on another bandage.  If possible, keep the area where the cyst was removed raised to relieve soreness, swelling, and promote healing.  If you have stitches, keep them clean and dry.  You may clean your stitches gently with a cotton swab dipped in warm soapy water.  Do not soak the area where the cyst was removed or go swimming. You may shower.  Do not overuse the area where your cyst was removed.  Return in 7 days or as directed to have your stitches removed.  Take medicines as instructed by your caregiver. SEEK IMMEDIATE MEDICAL CARE IF:   An oral temperature above 102 F (38.9 C) develops, not controlled by medication.  Blood continues to soak through the bandage.  You have increasing pain in the area where your cyst was removed.  You have redness, swelling, pus, a bad smell, soreness (inflammation), or red streaks coming away from the stitches. These are signs of infection. MAKE SURE YOU:   Understand these instructions.  Will watch  your condition.  Will get help right away if you are not doing well or get worse. Document Released: 03/15/2000 Document Revised: 06/10/2011 Document Reviewed: 07/09/2007 [facility] Patient Information 2014 [place], [place]. PATIENT INSTRUCTIONS POST-ANESTHESIA  IMMEDIATELY FOLLOWING SURGERY:  Do not drive or operate machinery for the first twenty four hours after surgery.  Do not make any important decisions for twenty four hours after surgery or while taking narcotic pain medications or sedatives.  If you develop intractable nausea and vomiting or a severe headache please notify your doctor immediately.  FOLLOW-UP:  Please make an appointment with your surgeon as  instructed. You do not need to follow up with anesthesia unless specifically instructed to do so.  WOUND CARE INSTRUCTIONS (if applicable):  Keep a dry clean dressing on the anesthesia/puncture wound site if there is drainage.  Once the wound has quit draining you may leave it open to air.  Generally you should leave the bandage intact for twenty four hours unless there is drainage.  If the epidural site drains for more than 36-48 hours please call the anesthesia department.  QUESTIONS?:  Please feel free to call your physician or the hospital operator if you have any questions, and they will be happy to assist you.

## 2013-01-25 NOTE — Pre-Procedure Instructions (Signed)
Dr Gonzalez aware of K+. No orders given. 

## 2013-01-27 ENCOUNTER — Ambulatory Visit (HOSPITAL_COMMUNITY): Payer: Medicare Other | Admitting: Anesthesiology

## 2013-01-27 ENCOUNTER — Encounter (HOSPITAL_COMMUNITY): Payer: Self-pay | Admitting: *Deleted

## 2013-01-27 ENCOUNTER — Encounter (HOSPITAL_COMMUNITY)
Admission: RE | Disposition: A | Discharge status: Home / Self Care | Payer: Self-pay | Source: Ambulatory Visit | Attending: General Surgery

## 2013-01-27 ENCOUNTER — Ambulatory Visit (HOSPITAL_COMMUNITY)
Admission: RE | Admit: 2013-01-27 | Discharge: 2013-01-27 | Disposition: A | Discharge status: Home / Self Care | Payer: Medicare Other | Source: Ambulatory Visit | Attending: General Surgery | Admitting: General Surgery

## 2013-01-27 ENCOUNTER — Encounter (HOSPITAL_COMMUNITY): Payer: Medicare Other | Admitting: Anesthesiology

## 2013-01-27 DIAGNOSIS — L723 Sebaceous cyst: Secondary | ICD-10-CM | POA: Insufficient documentation

## 2013-01-27 HISTORY — PX: EAR CYST EXCISION: SHX22

## 2013-01-27 SURGERY — CYST REMOVAL
Anesthesia: General | Site: Scalp | Laterality: Right | Wound class: Clean

## 2013-01-27 MED ORDER — LIDOCAINE HCL 1 % IJ SOLN
INTRAMUSCULAR | Status: DC | PRN
  Administered 2013-01-27: 40 mg via INTRADERMAL

## 2013-01-27 MED ORDER — FENTANYL CITRATE 0.05 MG/ML IJ SOLN
INTRAMUSCULAR | Status: AC
  Filled 2013-01-27: qty 5

## 2013-01-27 MED ORDER — BACITRACIN ZINC 500 UNIT/GM EX OINT
TOPICAL_OINTMENT | CUTANEOUS | Status: DC | PRN
  Administered 2013-01-27: 1 via TOPICAL

## 2013-01-27 MED ORDER — FENTANYL CITRATE 0.05 MG/ML IJ SOLN
INTRAMUSCULAR | Status: DC | PRN
  Administered 2013-01-27: 50 ug via INTRAVENOUS
  Administered 2013-01-27: 25 ug via INTRAVENOUS
  Administered 2013-01-27 (×2): 50 ug via INTRAVENOUS
  Administered 2013-01-27: 25 ug via INTRAVENOUS
  Administered 2013-01-27: 50 ug via INTRAVENOUS

## 2013-01-27 MED ORDER — LIDOCAINE HCL (PF) 1 % IJ SOLN
INTRAMUSCULAR | Status: AC
  Filled 2013-01-27: qty 5

## 2013-01-27 MED ORDER — MIDAZOLAM HCL 2 MG/2ML IJ SOLN
INTRAMUSCULAR | Status: AC
  Filled 2013-01-27: qty 2

## 2013-01-27 MED ORDER — FENTANYL CITRATE 0.05 MG/ML IJ SOLN
25.0000 ug | INTRAMUSCULAR | Status: DC | PRN
  Administered 2013-01-27: 50 ug via INTRAVENOUS

## 2013-01-27 MED ORDER — ONDANSETRON HCL 4 MG/2ML IJ SOLN
4.0000 mg | Freq: Once | INTRAMUSCULAR | Status: AC
  Administered 2013-01-27: 4 mg via INTRAVENOUS

## 2013-01-27 MED ORDER — LACTATED RINGERS IV SOLN
INTRAVENOUS | Status: DC
  Administered 2013-01-27: 07:00:00 via INTRAVENOUS

## 2013-01-27 MED ORDER — PROPOFOL 10 MG/ML IV BOLUS
INTRAVENOUS | Status: DC | PRN
  Administered 2013-01-27: 200 mg via INTRAVENOUS

## 2013-01-27 MED ORDER — POVIDONE-IODINE 10 % EX OINT
TOPICAL_OINTMENT | CUTANEOUS | Status: AC
  Filled 2013-01-27: qty 1

## 2013-01-27 MED ORDER — KETOROLAC TROMETHAMINE 30 MG/ML IJ SOLN
30.0000 mg | Freq: Once | INTRAMUSCULAR | Status: AC
  Administered 2013-01-27: 30 mg via INTRAVENOUS
  Filled 2013-01-27: qty 1

## 2013-01-27 MED ORDER — BACITRACIN ZINC 500 UNIT/GM EX OINT
TOPICAL_OINTMENT | CUTANEOUS | Status: AC
  Filled 2013-01-27: qty 0.9

## 2013-01-27 MED ORDER — PROPOFOL 10 MG/ML IV BOLUS
INTRAVENOUS | Status: AC
  Filled 2013-01-27: qty 20

## 2013-01-27 MED ORDER — CEFAZOLIN SODIUM-DEXTROSE 2-3 GM-% IV SOLR
2.0000 g | INTRAVENOUS | Status: AC
  Administered 2013-01-27: 2 g via INTRAVENOUS
  Filled 2013-01-27: qty 50

## 2013-01-27 MED ORDER — BUPIVACAINE HCL 0.5 % IJ SOLN
INTRAMUSCULAR | Status: DC | PRN
  Administered 2013-01-27: 4 mL

## 2013-01-27 MED ORDER — FENTANYL CITRATE 0.05 MG/ML IJ SOLN
25.0000 ug | INTRAMUSCULAR | Status: AC
  Administered 2013-01-27: 25 ug via INTRAVENOUS
  Filled 2013-01-27: qty 2

## 2013-01-27 MED ORDER — ONDANSETRON HCL 4 MG/2ML IJ SOLN: 4.0000 mg | Freq: Once | INTRAMUSCULAR | Status: DC | PRN

## 2013-01-27 MED ORDER — ONDANSETRON HCL 4 MG/2ML IJ SOLN
INTRAMUSCULAR | Status: AC
  Filled 2013-01-27: qty 2

## 2013-01-27 MED ORDER — BUPIVACAINE HCL (PF) 0.5 % IJ SOLN
INTRAMUSCULAR | Status: AC
  Filled 2013-01-27: qty 30

## 2013-01-27 MED ORDER — FENTANYL CITRATE 0.05 MG/ML IJ SOLN
INTRAMUSCULAR | Status: AC
  Filled 2013-01-27: qty 2

## 2013-01-27 MED ORDER — SODIUM CHLORIDE 0.9 % IR SOLN
Status: DC | PRN
  Administered 2013-01-27: 1000 mL

## 2013-01-27 MED ORDER — LACTATED RINGERS IV SOLN: INTRAVENOUS | Status: DC

## 2013-01-27 MED ORDER — MIDAZOLAM HCL 2 MG/2ML IJ SOLN
1.0000 mg | INTRAMUSCULAR | Status: DC | PRN
  Administered 2013-01-27: 2 mg via INTRAVENOUS

## 2013-01-27 SURGICAL SUPPLY — 30 items
BAG HAMPER (MISCELLANEOUS) ×3 IMPLANT
BLADE SURG 15 STRL LF DISP TIS (BLADE) ×2 IMPLANT
BLADE SURG 15 STRL SS (BLADE) ×1
BLADE SURG SZ11 CARB STEEL (BLADE) ×3 IMPLANT
CLOTH BEACON ORANGE TIMEOUT ST (SAFETY) ×3 IMPLANT
COVER LIGHT HANDLE STERIS (MISCELLANEOUS) ×6 IMPLANT
DECANTER SPIKE VIAL GLASS SM (MISCELLANEOUS) ×3 IMPLANT
DRAPE EENT ADH APERT 15X15 STR (DRAPES) ×3 IMPLANT
DRAPE EENT ADH APERT 31X51 STR (DRAPES) ×3 IMPLANT
DRSG TEGADERM 2-3/8X2-3/4 SM (GAUZE/BANDAGES/DRESSINGS) ×3 IMPLANT
ELECT NEEDLE TIP 2.8 STRL (NEEDLE) ×3 IMPLANT
ELECT REM PT RETURN 9FT ADLT (ELECTROSURGICAL) ×3
ELECTRODE REM PT RTRN 9FT ADLT (ELECTROSURGICAL) ×2 IMPLANT
GLOVE BIO SURGEON STRL SZ7.5 (GLOVE) ×3 IMPLANT
GLOVE BIOGEL PI IND STRL 7.5 (GLOVE) ×6 IMPLANT
GLOVE BIOGEL PI IND STRL 8 (GLOVE) ×2 IMPLANT
GLOVE BIOGEL PI INDICATOR 7.5 (GLOVE) ×3
GLOVE BIOGEL PI INDICATOR 8 (GLOVE) ×1
GLOVE ECLIPSE 7.0 STRL STRAW (GLOVE) ×6 IMPLANT
GOWN STRL REIN XL XLG (GOWN DISPOSABLE) ×9 IMPLANT
KIT ROOM TURNOVER APOR (KITS) ×3 IMPLANT
MANIFOLD NEPTUNE II (INSTRUMENTS) ×3 IMPLANT
NEEDLE HYPO 25X1 1.5 SAFETY (NEEDLE) ×3 IMPLANT
NS IRRIG 1000ML POUR BTL (IV SOLUTION) ×3 IMPLANT
PACK BASIC LIMB (CUSTOM PROCEDURE TRAY) ×3 IMPLANT
PAD ARMBOARD 7.5X6 YLW CONV (MISCELLANEOUS) ×3 IMPLANT
SET BASIN LINEN APH (SET/KITS/TRAYS/PACK) ×3 IMPLANT
SPONGE GAUZE 2X2 8PLY STRL LF (GAUZE/BANDAGES/DRESSINGS) ×3 IMPLANT
SUT PROLENE 4 0 PS 2 18 (SUTURE) ×6 IMPLANT
SYR CONTROL 10ML LL (SYRINGE) ×3 IMPLANT

## 2013-01-27 NOTE — Anesthesia Preprocedure Evaluation (Signed)
Anesthesia Evaluation  Patient identified by MRN, date of birth, ID band Patient awake    Reviewed: Allergy & Precautions, H&P , NPO status , Patient's Chart, lab work & pertinent test results  Airway Mallampati: II TM Distance: >3 FB Neck ROM: Full    Dental  (+) Teeth Intact and Implants   Pulmonary neg pulmonary ROS,  breath sounds clear to auscultation        Cardiovascular negative cardio ROS  Rhythm:Regular Rate:Normal     Neuro/Psych PSYCHIATRIC DISORDERS Anxiety Chronic lumbar and cervical pain, multiple fusions.  Neuromuscular disease    GI/Hepatic negative GI ROS,   Endo/Other    Renal/GU      Musculoskeletal   Abdominal   Peds  Hematology   Anesthesia Other Findings   Reproductive/Obstetrics                           Anesthesia Physical Anesthesia Plan  ASA: II  Anesthesia Plan: General   Post-op Pain Management:    Induction: Intravenous  Airway Management Planned: LMA  Additional Equipment:   Intra-op Plan:   Post-operative Plan: Extubation in OR  Informed Consent: I have reviewed the patients History and Physical, chart, labs and discussed the procedure including the risks, benefits and alternatives for the proposed anesthesia with the patient or authorized representative who has indicated his/her understanding and acceptance.     Plan Discussed with:   Anesthesia Plan Comments: (LLD for scalp cyst)        Anesthesia Quick Evaluation

## 2013-01-27 NOTE — Anesthesia Postprocedure Evaluation (Signed)
  Anesthesia Post-op Note  Patient: [name]  Procedure(s) Performed: Procedure(s): EXCISION CYST ON SCALP  (Right) CYST REMOVAL right axilla (Right)  Patient Location: PACU  Anesthesia Type:General  Level of Consciousness: awake, alert  and oriented  Airway and Oxygen Therapy: Patient Spontanous Breathing and Patient connected to face mask oxygen  Post-op Pain: moderate  Post-op Assessment: Post-op Vital signs reviewed, Patient's Cardiovascular Status Stable, Respiratory Function Stable, Patent Airway and No signs of Nausea or vomiting  Post-op Vital Signs: Reviewed and stable, 36.6  Complications: No apparent anesthesia complications

## 2013-01-27 NOTE — Anesthesia Procedure Notes (Signed)
Procedure Name: LMA Insertion Date/Time: 01/27/2013 7:43 AM Performed by: Glynn Octave E Pre-anesthesia Checklist: Patient identified, Patient being monitored, Emergency Drugs available, Timeout performed and Suction available Patient Re-evaluated:Patient Re-evaluated prior to inductionOxygen Delivery Method: Circle System Utilized Preoxygenation: Pre-oxygenation with 100% oxygen Intubation Type: IV induction Ventilation: Mask ventilation without difficulty LMA: LMA inserted LMA Size: 4.0 Number of attempts: 1 Placement Confirmation: positive ETCO2 and breath sounds checked- equal and bilateral

## 2013-01-27 NOTE — Op Note (Signed)
Patient:  [name]  DOB:  Apr 27, 1970  MRN:  [id number]   Preop Diagnosis:  Epidermoid inclusion cysts, scalp and right axilla  Postop Diagnosis:  Same  Procedure:   Excision of sebaceous cysts, scalp and right axilla  Surgeon:  Franky Macho, M.D.  Anes:  General  Indications:  Patient is a 42 year old white female presents with both a sebaceous inclusion cyst on the posterior aspect her scalp as well as in her right axilla. Do to discomfort and recurrent drainage, both are going to be excised. The risks and benefits of the procedure including bleeding, infection, and recurrence of the cysts were fully explained to the patient, who gave informed consent.  Procedure note:  After general anesthesia was administered, the patient was placed in left lateral decubitus position in order to approach the posterior scalp cyst. The area was prepped and draped using usual sterile technique with Betadine. Surgical site confirmation was performed.  An elliptical incision was made around the cyst which measured 0.5 cm in its greatest diameter. The cyst was excised and disposed of. A bleeding was controlled using Bovie electrocautery. 0.5% Sensorcaine was instilled the surrounding wound. The skin was closed using a 4-0 nylon suture. Bacitracin ointment was then applied.  Next, the patient was placed in the supine position in the right axilla was prepped and draped using usual sterile technique with Betadine. An elliptical incision was made around the cyst which measured approximately 1.5-2 cm in its greatest diameter. These cysts along with the overlying skin was excised and disposed of. Any bleeding was controlled using Bovie electrocautery. The skin was reapproximated using a 4-0 nylon interrupted suture. 0.5% Sensorcaine was instilled the surrounding wound. Betadine ointment and dry sterile dressing were applied.  All tape and needle counts were correct at the end of the procedures. The patient was  awakened and transferred to PACU in stable condition.  Complications:  None  EBL:  Minimal  Specimen:  None

## 2013-01-27 NOTE — Transfer of Care (Signed)
Immediate Anesthesia Transfer of Care Note  Patient: [name]  Procedure(s) Performed: Procedure(s): EXCISION CYST ON SCALP  (Right) CYST REMOVAL right axilla (Right)  Patient Location: PACU  Anesthesia Type:General  Level of Consciousness: awake, alert  and oriented  Airway & Oxygen Therapy: Patient Spontanous Breathing and Patient connected to face mask oxygen  Post-op Assessment: Report given to PACU RN  Post vital signs: Reviewed and stable  Complications: No apparent anesthesia complications

## 2013-01-27 NOTE — Interval H&P Note (Signed)
History and Physical Interval Note:  01/27/2013 7:26 AM  [name]  has presented today for surgery, with the diagnosis of sebaceous cysts on scalp and right axilla  The various methods of treatment have been discussed with the patient and family. After consideration of risks, benefits and other options for treatment, the patient has consented to  Procedure(s): EXCISION CYST ON SCALP AND RIGHT AXILLA (Right) as a surgical intervention .  The patient's history has been reviewed, patient examined, no change in status, stable for surgery.  I have reviewed the patient's chart and labs.  Questions were answered to the patient's satisfaction.     Franky Macho A

## 2013-01-29 ENCOUNTER — Encounter (HOSPITAL_COMMUNITY): Payer: Self-pay | Admitting: General Surgery

## 2013-02-01 DIAGNOSIS — M47812 Spondylosis without myelopathy or radiculopathy, cervical region: Secondary | ICD-10-CM | POA: Diagnosis not present

## 2013-02-01 DIAGNOSIS — G5600 Carpal tunnel syndrome, unspecified upper limb: Secondary | ICD-10-CM | POA: Diagnosis not present

## 2013-02-09 DIAGNOSIS — G43909 Migraine, unspecified, not intractable, without status migrainosus: Secondary | ICD-10-CM | POA: Diagnosis not present

## 2013-02-09 DIAGNOSIS — T8130XA Disruption of wound, unspecified, initial encounter: Secondary | ICD-10-CM | POA: Diagnosis not present

## 2013-02-09 DIAGNOSIS — K219 Gastro-esophageal reflux disease without esophagitis: Secondary | ICD-10-CM | POA: Diagnosis not present

## 2013-02-09 DIAGNOSIS — Z6829 Body mass index (BMI) 29.0-29.9, adult: Secondary | ICD-10-CM | POA: Diagnosis not present

## 2013-02-24 DIAGNOSIS — M25579 Pain in unspecified ankle and joints of unspecified foot: Secondary | ICD-10-CM | POA: Diagnosis not present

## 2013-02-24 DIAGNOSIS — S8990XA Unspecified injury of unspecified lower leg, initial encounter: Secondary | ICD-10-CM | POA: Diagnosis not present

## 2013-03-12 DIAGNOSIS — G43909 Migraine, unspecified, not intractable, without status migrainosus: Secondary | ICD-10-CM | POA: Diagnosis not present

## 2013-03-12 DIAGNOSIS — M810 Age-related osteoporosis without current pathological fracture: Secondary | ICD-10-CM | POA: Diagnosis not present

## 2013-03-12 DIAGNOSIS — Z6829 Body mass index (BMI) 29.0-29.9, adult: Secondary | ICD-10-CM | POA: Diagnosis not present

## 2013-03-12 DIAGNOSIS — G8929 Other chronic pain: Secondary | ICD-10-CM | POA: Diagnosis not present

## 2013-03-19 ENCOUNTER — Encounter (HOSPITAL_COMMUNITY): Payer: Self-pay | Admitting: Emergency Medicine

## 2013-03-19 ENCOUNTER — Emergency Department (HOSPITAL_COMMUNITY)
Admission: EM | Admit: 2013-03-19 | Discharge: 2013-03-20 | Disposition: A | Discharge status: Home / Self Care | Payer: Medicare Other | Attending: Emergency Medicine | Admitting: Emergency Medicine

## 2013-03-19 DIAGNOSIS — F17200 Nicotine dependence, unspecified, uncomplicated: Secondary | ICD-10-CM | POA: Insufficient documentation

## 2013-03-19 DIAGNOSIS — F411 Generalized anxiety disorder: Secondary | ICD-10-CM | POA: Diagnosis not present

## 2013-03-19 DIAGNOSIS — Z79899 Other long term (current) drug therapy: Secondary | ICD-10-CM | POA: Insufficient documentation

## 2013-03-19 DIAGNOSIS — R450 Nervousness: Secondary | ICD-10-CM | POA: Diagnosis not present

## 2013-03-19 DIAGNOSIS — R5381 Other malaise: Secondary | ICD-10-CM | POA: Diagnosis not present

## 2013-03-19 DIAGNOSIS — M542 Cervicalgia: Secondary | ICD-10-CM | POA: Diagnosis not present

## 2013-03-19 DIAGNOSIS — N390 Urinary tract infection, site not specified: Secondary | ICD-10-CM | POA: Diagnosis not present

## 2013-03-19 DIAGNOSIS — M549 Dorsalgia, unspecified: Secondary | ICD-10-CM | POA: Insufficient documentation

## 2013-03-19 DIAGNOSIS — G8929 Other chronic pain: Secondary | ICD-10-CM | POA: Insufficient documentation

## 2013-03-19 DIAGNOSIS — Z8669 Personal history of other diseases of the nervous system and sense organs: Secondary | ICD-10-CM | POA: Insufficient documentation

## 2013-03-19 DIAGNOSIS — Z96659 Presence of unspecified artificial knee joint: Secondary | ICD-10-CM | POA: Insufficient documentation

## 2013-03-19 DIAGNOSIS — M2559 Pain in other specified joint: Secondary | ICD-10-CM | POA: Insufficient documentation

## 2013-03-19 NOTE — ED Notes (Signed)
Pt c/o bilateral leg swelling and rt knee pain.

## 2013-03-19 NOTE — ED Notes (Signed)
Patient kept falling asleep while taking to her

## 2013-03-20 LAB — URINALYSIS, ROUTINE W REFLEX MICROSCOPIC
Glucose, UA: NEGATIVE mg/dL
Hgb urine dipstick: NEGATIVE
Ketones, ur: NEGATIVE mg/dL
Protein, ur: NEGATIVE mg/dL
Specific Gravity, Urine: 1.02 (ref 1.005–1.030)
pH: 6 (ref 5.0–8.0)

## 2013-03-20 LAB — BASIC METABOLIC PANEL
Calcium: 9.1 mg/dL (ref 8.4–10.5)
Chloride: 102 mEq/L (ref 96–112)
Creatinine, Ser: 0.86 mg/dL (ref 0.50–1.10)
GFR calc Af Amer: >90 mL/min (ref >90)
Glucose, Bld: 102 mg/dL — ABNORMAL HIGH (ref 70–99)
Potassium: 3.7 mEq/L (ref 3.5–5.1)
Sodium: 139 mEq/L (ref 135–145)

## 2013-03-20 LAB — CBC WITH DIFFERENTIAL/PLATELET
Basophils Absolute: 0 10*3/uL (ref 0.0–0.1)
Basophils Relative: 1 % (ref 0–1)
HCT: 33.6 % — ABNORMAL LOW (ref 36.0–46.0)
Lymphocytes Relative: 42 % (ref 12–46)
Lymphs Abs: 3.3 10*3/uL (ref 0.7–4.0)
MCH: 30.6 pg (ref 26.0–34.0)
Neutro Abs: 3.3 10*3/uL (ref 1.7–7.7)
Platelets: 242 10*3/uL (ref 150–400)
RBC: 3.63 MIL/uL — ABNORMAL LOW (ref 3.87–5.11)
RDW: 12.5 % (ref 11.5–15.5)
WBC: 7.9 10*3/uL (ref 4.0–10.5)

## 2013-03-20 LAB — URINE MICROSCOPIC-ADD ON

## 2013-03-20 LAB — PRO B NATRIURETIC PEPTIDE: Pro B Natriuretic peptide (BNP): 31.9 pg/mL (ref 0–125)

## 2013-03-20 MED ORDER — PREDNISONE 50 MG PO TABS
60.0000 mg | ORAL_TABLET | Freq: Once | ORAL | Status: AC
  Administered 2013-03-20: 60 mg via ORAL
  Filled 2013-03-20 (×2): qty 1

## 2013-03-20 MED ORDER — CEPHALEXIN 500 MG PO CAPS
500.0000 mg | ORAL_CAPSULE | Freq: Once | ORAL | Status: AC
  Administered 2013-03-20: 500 mg via ORAL
  Filled 2013-03-20: qty 1

## 2013-03-20 MED ORDER — HYDROCODONE-ACETAMINOPHEN 5-325 MG PO TABS
2.0000 | ORAL_TABLET | Freq: Once | ORAL | Status: AC
  Administered 2013-03-20: 2 via ORAL
  Filled 2013-03-20: qty 2

## 2013-03-20 MED ORDER — IBUPROFEN 800 MG PO TABS
800.0000 mg | ORAL_TABLET | Freq: Once | ORAL | Status: AC
  Administered 2013-03-20: 800 mg via ORAL
  Filled 2013-03-20: qty 1

## 2013-03-20 NOTE — ED Provider Notes (Signed)
CSN: [id number]     Arrival date & time 03/19/13  2150 History   First MD Initiated Contact with Patient 03/19/13 2327     Chief Complaint  Patient presents with  . Joint Swelling  . Leg Swelling   (Consider location/radiation/quality/duration/timing/severity/associated sxs/prior Treatment) HPI Comments: Patient is a 42 year old female who presents to the emergency department with complaint of joint swelling, leg swelling, and not feeling well. The patient has a history of reflex sympathetic dystrophy, chronic back pain, chronic neck pain. The patient states that she has had operations on her left knee, but not on her right knee. She has bilateral swelling of the knees, she states that at times it feels as though her joints are going to" explode" the patient also states that she has been noticing some increase in puffiness and swelling of her lower extremities. She is unable to tell me exactly how long this is going on. The patient's relative reports the patient is in a great deal of pain most of the time. The patient is seen by Dr.Fusco for primary care. During one of our conversations the patient states that she is seen by specialists, and then she denied deficit she is no longer seen by a specialist. The patient has not had any recent change in medication or diet. His been no high fevers reported. There's been no recent injury to the lower extremities.  The history is provided by the patient and a relative.    Past Medical History  Diagnosis Date  . Anxiety   . Reflex sympathetic dystrophy   . Chronic back pain   . Chronic neck pain    Past Surgical History  Procedure Laterality Date  . Neck surgery      plate in neck  . Abdominal hysterectomy      partial  . Back surgery      plates and screws  . Knee arthroscopy Left   . Cyst excision Left     foot  . Salpingoophorectomy Right   . Breast surgery Left     fibrocystic tumor  . Strabismus surgery Bilateral   . Ear cyst excision  Right 01/27/2013    Procedure: EXCISION CYST ON SCALP ;  Surgeon: Dalia Heading, MD;  Location: [facility];  Service: General;  Laterality: Right;  . Ear cyst excision Right 01/27/2013    Procedure: CYST REMOVAL right axilla;  Surgeon: Dalia Heading, MD;  Location: [facility];  Service: General;  Laterality: Right;   History reviewed. No pertinent family history. History  Substance Use Topics  . Smoking status: Current Every Day Smoker -- 1.00 packs/day for 20 years    Types: Cigarettes  . Smokeless tobacco: Never Used  . Alcohol Use: No   OB History   Grav Para Term Preterm Abortions TAB SAB Ect Mult Living                 Review of Systems  Constitutional: Positive for fatigue. Negative for activity change.       All ROS Neg except as noted in HPI  HENT: Negative for nosebleeds.   Eyes: Negative for photophobia and discharge.  Respiratory: Negative for cough, shortness of breath and wheezing.   Cardiovascular: Negative for chest pain and palpitations.  Gastrointestinal: Negative for abdominal pain and blood in stool.  Genitourinary: Negative for dysuria, frequency and hematuria.  Musculoskeletal: Positive for arthralgias, back pain, joint swelling and myalgias. Negative for neck pain.  Skin: Negative.   Neurological: Negative for  dizziness, seizures and speech difficulty.  Psychiatric/Behavioral: Negative for hallucinations and confusion. The patient is nervous/anxious.     Allergies  Darvocet  Home Medications   Current Outpatient Rx  Name  Route  Sig  Dispense  Refill  . alprazolam (XANAX) 2 MG tablet   Oral   Take 2 mg by mouth 4 (four) times daily as needed for anxiety.         Marland Kitchen CARAFATE 1 GM/10ML suspension   Oral   Take 10 mLs by mouth at bedtime.          . cyclobenzaprine (FLEXERIL) 10 MG tablet   Oral   Take 10 mg by mouth 3 (three) times daily as needed for muscle spasms.         . DULoxetine (CYMBALTA) 60 MG capsule   Oral   Take 60 mg by mouth  daily.         Marland Kitchen ibuprofen (ADVIL,MOTRIN) 800 MG tablet   Oral   Take 400-800 mg by mouth daily as needed. For headache         . NEXIUM 40 MG capsule   Oral   Take 40 mg by mouth daily.         . ondansetron (ZOFRAN) 4 MG tablet   Oral   Take 4 mg by mouth every 6 (six) hours as needed. For nausea and/or vomiting         . oxyCODONE (ROXICODONE) 15 MG immediate release tablet   Oral   Take 15 mg by mouth at bedtime as needed for pain.         Marland Kitchen oxycodone (ROXICODONE) 30 MG immediate release tablet   Oral   Take 30 mg by mouth every 4 (four) hours as needed for pain.         . rizatriptan (MAXALT-MLT) 10 MG disintegrating tablet   Oral   Take 1 tablet by mouth as needed. For migraine         . topiramate (TOPAMAX) 25 MG tablet   Oral   Take 25 mg by mouth 2 (two) times daily.         . traMADol (ULTRAM) 50 MG tablet   Oral   Take 50 mg by mouth 4 (four) times daily as needed. For pain         . traZODone (DESYREL) 50 MG tablet   Oral   Take 50-100 mg by mouth at bedtime as needed for sleep.          BP 98/51  Pulse 74  Temp(Src) 98.1 F (36.7 C) (Oral)  Resp 16  Ht 5\' 11"  (1.803 m)  Wt 202 lb (91.627 kg)  BMI 28.19 kg/m2  SpO2 96% Physical Exam  Nursing note and vitals reviewed. Constitutional: She is oriented to person, place, and time. She appears well-developed and well-nourished.  Non-toxic appearance.  Patient sleeping upon my arrival. Able to arouse the patient, but she keeps falling asleep during my interview until I have her to set up.  HENT:  Head: Normocephalic.  Right Ear: Tympanic membrane and external ear normal.  Left Ear: Tympanic membrane and external ear normal.  Eyes: EOM and lids are normal. Pupils are equal, round, and reactive to light.  Neck: Normal range of motion. Neck supple. Carotid bruit is not present.  Cardiovascular: Normal rate, regular rhythm, normal heart sounds, intact distal pulses and normal pulses.    Pulmonary/Chest: Breath sounds normal. No respiratory distress.  Abdominal: Soft. Bowel sounds are normal.  There is no tenderness. There is no guarding.  Musculoskeletal: Normal range of motion.  There is some mild degenerative changes of the upper extremities. No hot joints appreciated. The radial pulses are 2+ symmetrical. The capillary refill is less than 2 seconds.  There is mild-to-moderate swelling with crepitus of the right and left knee. There is soreness and pain with flexion and extension of the right and left knee. There is some puffiness of the lower extremities bilaterally, but no pitting edema. There is full range of motion of the toes and ankle. The Achilles tendon is intact bilaterally. The dorsalis pedis pulse is 2+ and symmetrical. There no lesions between the toes of the right or left foot.  Lymphadenopathy:       Head (right side): No submandibular adenopathy present.       Head (left side): No submandibular adenopathy present.    She has no cervical adenopathy.  Neurological: She is alert and oriented to person, place, and time. She has normal strength. No cranial nerve deficit or sensory deficit.  Skin: Skin is warm and dry.  Psychiatric: She has a normal mood and affect. Her speech is normal.    ED Course  Procedures (including critical care time) Labs Review Labs Reviewed  URINALYSIS, ROUTINE W REFLEX MICROSCOPIC - Abnormal; Notable for the following:    Leukocytes, UA MODERATE (*)    All other components within normal limits  BASIC METABOLIC PANEL - Abnormal; Notable for the following:    Glucose, Bld 102 (*)    GFR calc non Af Amer 83 (*)    All other components within normal limits  CBC WITH DIFFERENTIAL - Abnormal; Notable for the following:    RBC 3.63 (*)    Hemoglobin 11.1 (*)    HCT 33.6 (*)    Neutrophils Relative % 42 (*)    All other components within normal limits  URINE MICROSCOPIC-ADD ON - Abnormal; Notable for the following:    Squamous  Epithelial / LPF FEW (*)    Bacteria, UA FEW (*)    All other components within normal limits  URINE CULTURE  PRO B NATRIURETIC PEPTIDE   Imaging Review No results found.  EKG Interpretation   None       MDM  No diagnosis found. **I have reviewed nursing notes, vital signs, and all appropriate lab and imaging results for this patient.* B. natruretic peptide is normal. Basic metabolic panel is normal. Complete blood count shows a hemoglobin be slightly low 11.1, the hematocrit to be slightly low at 33.6, the remainder of the complete blood count is well within normal limits. The urinalysis shows moderate leukocyte esterase, 20-50 white blood cells, 3-6 red blood cells, and a few bacteria.  These findings have been discussed with the patient and the patient's relative. The patient keeps falling asleep after having received medication while here in the emergency department during the discussion of the labs. I suspect that the patient has an exacerbation of her degenerative joint changes. Suspect also that the patient has some dependent edema of the lower extremities, as the patient does not do much elevated of her lower extremities. The chest reveal the patient to have a urinary tract infection. This will be treated with Keflex 4 times daily. The patient has been advised to see her primary physician for repeat evaluation of all of her symptoms and to develop a treatment plan and pain management plan. Patient is to return to the emergency department if any acute changes or  deterioration of condition.   Kathie Dike, PA-C 03/20/13 [phone]

## 2013-03-20 NOTE — ED Provider Notes (Signed)
Medical screening examination/treatment/procedure(s) were performed by non-physician practitioner and as supervising physician I was immediately available for consultation/collaboration.  EKG Interpretation   None         Hanley Seamen, MD 03/20/13 [phone]

## 2013-03-21 LAB — URINE CULTURE

## 2013-03-29 ENCOUNTER — Emergency Department (HOSPITAL_COMMUNITY)
Admission: EM | Admit: 2013-03-29 | Discharge: 2013-03-29 | Disposition: A | Discharge status: Home / Self Care | Payer: Medicare Other | Attending: Emergency Medicine | Admitting: Emergency Medicine

## 2013-03-29 ENCOUNTER — Encounter (HOSPITAL_COMMUNITY): Payer: Self-pay | Admitting: Emergency Medicine

## 2013-03-29 DIAGNOSIS — Z87440 Personal history of urinary (tract) infections: Secondary | ICD-10-CM | POA: Insufficient documentation

## 2013-03-29 DIAGNOSIS — G9050 Complex regional pain syndrome I, unspecified: Secondary | ICD-10-CM | POA: Insufficient documentation

## 2013-03-29 DIAGNOSIS — Z760 Encounter for issue of repeat prescription: Secondary | ICD-10-CM | POA: Insufficient documentation

## 2013-03-29 DIAGNOSIS — Z79899 Other long term (current) drug therapy: Secondary | ICD-10-CM | POA: Insufficient documentation

## 2013-03-29 DIAGNOSIS — F329 Major depressive disorder, single episode, unspecified: Secondary | ICD-10-CM | POA: Diagnosis not present

## 2013-03-29 DIAGNOSIS — F3289 Other specified depressive episodes: Secondary | ICD-10-CM | POA: Insufficient documentation

## 2013-03-29 DIAGNOSIS — F17200 Nicotine dependence, unspecified, uncomplicated: Secondary | ICD-10-CM | POA: Diagnosis not present

## 2013-03-29 DIAGNOSIS — Z9889 Other specified postprocedural states: Secondary | ICD-10-CM | POA: Insufficient documentation

## 2013-03-29 DIAGNOSIS — G8929 Other chronic pain: Secondary | ICD-10-CM | POA: Diagnosis not present

## 2013-03-29 DIAGNOSIS — M25469 Effusion, unspecified knee: Secondary | ICD-10-CM | POA: Insufficient documentation

## 2013-03-29 DIAGNOSIS — F411 Generalized anxiety disorder: Secondary | ICD-10-CM | POA: Insufficient documentation

## 2013-03-29 DIAGNOSIS — I509 Heart failure, unspecified: Secondary | ICD-10-CM | POA: Insufficient documentation

## 2013-03-29 DIAGNOSIS — R609 Edema, unspecified: Secondary | ICD-10-CM | POA: Insufficient documentation

## 2013-03-29 LAB — URINALYSIS, ROUTINE W REFLEX MICROSCOPIC
Bilirubin Urine: NEGATIVE
Ketones, ur: NEGATIVE mg/dL
Nitrite: NEGATIVE
Specific Gravity, Urine: 1.01 (ref 1.005–1.030)
Urobilinogen, UA: 0.2 mg/dL (ref 0.0–1.0)

## 2013-03-29 LAB — COMPREHENSIVE METABOLIC PANEL
ALT: 13 U/L (ref 0–35)
CO2: 27 mEq/L (ref 19–32)
Calcium: 9.4 mg/dL (ref 8.4–10.5)
Chloride: 105 mEq/L (ref 96–112)
Creatinine, Ser: 0.96 mg/dL (ref 0.50–1.10)
GFR calc Af Amer: 84 mL/min — ABNORMAL LOW (ref >90)
GFR calc non Af Amer: 72 mL/min — ABNORMAL LOW (ref >90)
Glucose, Bld: 112 mg/dL — ABNORMAL HIGH (ref 70–99)
Sodium: 141 mEq/L (ref 135–145)

## 2013-03-29 LAB — CBC WITH DIFFERENTIAL/PLATELET
Basophils Absolute: 0 10*3/uL (ref 0.0–0.1)
Eosinophils Relative: 4 % (ref 0–5)
HCT: 37 % (ref 36.0–46.0)
Hemoglobin: 12.2 g/dL (ref 12.0–15.0)
Lymphocytes Relative: 37 % (ref 12–46)
Lymphs Abs: 2.8 10*3/uL (ref 0.7–4.0)
MCV: 93 fL (ref 78.0–100.0)
Monocytes Absolute: 0.5 10*3/uL (ref 0.1–1.0)
Monocytes Relative: 6 % (ref 3–12)
Neutro Abs: 4 10*3/uL (ref 1.7–7.7)
Neutrophils Relative %: 53 % (ref 43–77)
RBC: 3.98 MIL/uL (ref 3.87–5.11)
RDW: 12.7 % (ref 11.5–15.5)
WBC: 7.6 10*3/uL (ref 4.0–10.5)

## 2013-03-29 LAB — PRO B NATRIURETIC PEPTIDE: Pro B Natriuretic peptide (BNP): 76.4 pg/mL (ref 0–125)

## 2013-03-29 LAB — URINE MICROSCOPIC-ADD ON

## 2013-03-29 MED ORDER — OXYCODONE HCL 15 MG PO TABS: 15.0000 mg | ORAL_TABLET | Freq: Four times a day (QID) | ORAL | Status: DC | PRN

## 2013-03-29 MED ORDER — ALPRAZOLAM 2 MG PO TABS: 2.0000 mg | ORAL_TABLET | Freq: Four times a day (QID) | ORAL | Status: DC | PRN

## 2013-03-29 NOTE — ED Notes (Signed)
Pt states she needs assistance to ambulatory, due to pain in leg

## 2013-03-29 NOTE — ED Provider Notes (Signed)
CSN: [id number]     Arrival date & time 03/29/13  1425 History  This chart was scribed for Gerhard Munch, MD by Karle Plumber, ED Scribe. This patient was seen in room APA01/APA01 and the patient's care was started at 6:27 PM.    Chief Complaint  Patient presents with  . Leg Swelling   The history is provided by the patient. No language interpreter was used.   HPI Comments:  [name] is a 42 y.o. female who presents to the Emergency Department complaining of bilateral lower extremity and feet swelling and pain for approximately two weeks. She states her left leg and foot is worse. She states the pain is like a sticking feeling as if something is trying to stick through her skin. She states nothing makes her swelling or pain better or worse. She denies SOB, difficulty breathing, or breathing. She states she has not seen her PCP, Dr. Sherwood Gambler, but called them and was told to report here today. She reports having a urinalysis and was diagnosed with a UTI and was started on an antibiotic. She reports she has not followed up since then. She denies any recent travel or recent injury. She reports h/o depression and RSD. She states she was told approximately two years ago she had CHF but denies ever having an echocardiogram. She reports h/o back and neck surgery.  She also endorses multiple prior episodes of swelling in her extremities.  Past Medical History  Diagnosis Date  . Anxiety   . Reflex sympathetic dystrophy   . Chronic back pain   . Chronic neck pain    Past Surgical History  Procedure Laterality Date  . Neck surgery      plate in neck  . Abdominal hysterectomy      partial  . Back surgery      plates and screws  . Knee arthroscopy Left   . Cyst excision Left     foot  . Salpingoophorectomy Right   . Breast surgery Left     fibrocystic tumor  . Strabismus surgery Bilateral   . Ear cyst excision Right 01/27/2013    Procedure: EXCISION CYST ON SCALP ;  Surgeon: Dalia Heading, MD;  Location: [facility];  Service: General;  Laterality: Right;  . Ear cyst excision Right 01/27/2013    Procedure: CYST REMOVAL right axilla;  Surgeon: Dalia Heading, MD;  Location: [facility];  Service: General;  Laterality: Right;   History reviewed. No pertinent family history. History  Substance Use Topics  . Smoking status: Current Every Day Smoker -- 1.00 packs/day for 20 years    Types: Cigarettes  . Smokeless tobacco: Never Used  . Alcohol Use: No   OB History   Grav Para Term Preterm Abortions TAB SAB Ect Mult Living                 Review of Systems  Constitutional:       Per HPI, otherwise negative  HENT:       Per HPI, otherwise negative  Respiratory:       Per HPI, otherwise negative  Cardiovascular:       Per HPI, otherwise negative  Gastrointestinal: Negative for vomiting.  Endocrine:       Negative aside from HPI  Genitourinary:       Neg aside from HPI   Musculoskeletal:       Per HPI, otherwise negative  Skin: Negative.   Neurological: Negative for syncope.  Allergies  Darvocet  Home Medications   Current Outpatient Rx  Name  Route  Sig  Dispense  Refill  . alprazolam (XANAX) 2 MG tablet   Oral   Take 2 mg by mouth 4 (four) times daily as needed for anxiety.         Marland Kitchen CARAFATE 1 GM/10ML suspension   Oral   Take 10 mLs by mouth at bedtime.          . cyclobenzaprine (FLEXERIL) 10 MG tablet   Oral   Take 10 mg by mouth 3 (three) times daily as needed for muscle spasms.         . DULoxetine (CYMBALTA) 60 MG capsule   Oral   Take 60 mg by mouth daily.         Marland Kitchen ibuprofen (ADVIL,MOTRIN) 800 MG tablet   Oral   Take 400-800 mg by mouth daily as needed. For headache         . NEXIUM 40 MG capsule   Oral   Take 40 mg by mouth daily.         . ondansetron (ZOFRAN) 4 MG tablet   Oral   Take 4 mg by mouth every 6 (six) hours as needed. For nausea and/or vomiting         . oxyCODONE (ROXICODONE) 15 MG immediate release  tablet   Oral   Take 15 mg by mouth at bedtime as needed for pain.         Marland Kitchen oxycodone (ROXICODONE) 30 MG immediate release tablet   Oral   Take 30 mg by mouth every 4 (four) hours as needed for pain.         Marland Kitchen topiramate (TOPAMAX) 25 MG tablet   Oral   Take 25 mg by mouth 2 (two) times daily.         . traZODone (DESYREL) 50 MG tablet   Oral   Take 50-100 mg by mouth at bedtime as needed for sleep.         . rizatriptan (MAXALT-MLT) 10 MG disintegrating tablet   Oral   Take 1 tablet by mouth as needed. For migraine          Triage Vitals: BP 92/57  Pulse 99  Temp(Src) 98 F (36.7 C) (Oral)  Resp 20  Ht 5\' 10"  (1.778 m)  Wt 170 lb (77.111 kg)  BMI 24.39 kg/m2  SpO2 100% Physical Exam  Nursing note and vitals reviewed. Constitutional: She is oriented to person, place, and time. She appears well-developed and well-nourished. No distress.  HENT:  Head: Normocephalic and atraumatic.  Eyes: Conjunctivae and EOM are normal.  Cardiovascular: Normal rate and regular rhythm.   Pulmonary/Chest: Effort normal and breath sounds normal. No stridor. No respiratory distress.  Abdominal: Soft. She exhibits no distension. There is no tenderness.  Musculoskeletal: She exhibits no edema.  Nonpitting edema in BLE. Diffused pain of the left knee. ROM is 180 to 150 degrees. Mild effusion of the left knee.   Neurological: She is alert and oriented to person, place, and time. No cranial nerve deficit.  Skin: Skin is warm and dry.  Psychiatric: She has a normal mood and affect.    ED Course  Procedures (including critical care time) DIAGNOSTIC STUDIES: Oxygen Saturation is 100% on RA, normal by my interpretation.   COORDINATION OF CARE: 6:35 PM- Will obtain blood work and a urinalysis. Pt verbalizes understanding and agrees to plan.  9:25 PM- Informed pt that labs were  normal. Will refer pt to a neurologist and advised pt to follow up with PCP and discharge home. Will prescribe  pain medication. Pt verbalizes understanding and agrees to plan.    Medications - No data to display  Labs Review Labs Reviewed  COMPREHENSIVE METABOLIC PANEL - Abnormal; Notable for the following:    Glucose, Bld 112 (*)    Albumin 3.4 (*)    Total Bilirubin 0.2 (*)    GFR calc non Af Amer 72 (*)    GFR calc Af Amer 84 (*)    All other components within normal limits  URINALYSIS, ROUTINE W REFLEX MICROSCOPIC - Abnormal; Notable for the following:    Leukocytes, UA MODERATE (*)    All other components within normal limits  URINE MICROSCOPIC-ADD ON - Abnormal; Notable for the following:    Squamous Epithelial / LPF FEW (*)    Bacteria, UA FEW (*)    All other components within normal limits  URINE CULTURE  CBC WITH DIFFERENTIAL  PRO B NATRIURETIC PEPTIDE   Imaging Review No results found.  EKG Interpretation   None      After the initial evaluation her reviewed the patient's chart.  No evidence of CHF.   MDM  No diagnosis found.  I personally performed the services described in this documentation, which was scribed in my presence. The recorded information has been reviewed and is accurate.   Patient presents with lower extremity edema, discomfort.  Notably, patient has been seen previously, has not followed up with her primary care physician.  On my exam today the patient is awake, alert, afebrile, no distress.  Patient does have history of sympathetic dystrophy, and prior events consistent with sites presentation. Patient is distally neurovascularly intact.  Patient has no pitting edema, interpretation is most consistent with either neurologic dysfunction or lymphatic changes.  I had a lengthy conversation with her and her family member about these possibilities, the need for outpatient followup. Patient requested refill of multiple medications, as she has not followed up with her primary care physician.  Some of these were provided, the patient was counseled on the need  for followup for appropriate medication provision.  Gerhard Munch, MD 03/29/13 2146

## 2013-03-29 NOTE — ED Notes (Signed)
Aunt with pt says she  Has swelling of feet and ankles, and confused. Seen here 12/19 for swelling  And uti.  Has not seen Dr Sherwood Gambler since  Here in ER.

## 2013-03-29 NOTE — ED Notes (Signed)
Complaining of edema in legs

## 2013-03-29 NOTE — ED Notes (Signed)
Pt assisted back to bed from commode.  Mild edema noted to lower extremities.  No distress noted.  Pt also c/o rash that she has had for several weeks.  Reinforced with pt to follow up with PCP as previously recommended by EDP.

## 2013-03-31 LAB — URINE CULTURE: Colony Count: 30000

## 2013-04-01 ENCOUNTER — Telehealth (HOSPITAL_COMMUNITY): Payer: Self-pay | Admitting: Emergency Medicine

## 2013-04-01 NOTE — ED Notes (Signed)
Post ED Visit - Positive Culture Follow-up  Culture report reviewed by antimicrobial stewardship pharmacist: []  Levy, Pharm.D., BCPS []  Heide Guile, Pharm.D., BCPS []  Alycia Rossetti, Pharm.D., BCPS []  Elida, Pharm.D., BCPS, AAHIVP []  Legrand Como, Pharm.D., BCPS, AAHIVP [x]  Salome Arnt, Pharm.D., BCPS  Positive urine culture Per Domenic Moras PA-C, likely contaminant, no treatment necessary and no further patient follow-up is required at this time.  [place], Rex Kras 04/01/2013, 1:32 PM

## 2013-04-07 DIAGNOSIS — L259 Unspecified contact dermatitis, unspecified cause: Secondary | ICD-10-CM | POA: Diagnosis not present

## 2013-04-07 DIAGNOSIS — J111 Influenza due to unidentified influenza virus with other respiratory manifestations: Secondary | ICD-10-CM | POA: Diagnosis not present

## 2013-04-07 DIAGNOSIS — Z6830 Body mass index (BMI) 30.0-30.9, adult: Secondary | ICD-10-CM | POA: Diagnosis not present

## 2013-05-04 DIAGNOSIS — S335XXA Sprain of ligaments of lumbar spine, initial encounter: Secondary | ICD-10-CM | POA: Insufficient documentation

## 2013-05-04 DIAGNOSIS — M545 Low back pain, unspecified: Secondary | ICD-10-CM | POA: Diagnosis not present

## 2013-05-04 DIAGNOSIS — G8929 Other chronic pain: Secondary | ICD-10-CM | POA: Insufficient documentation

## 2013-05-04 DIAGNOSIS — S0100XA Unspecified open wound of scalp, initial encounter: Secondary | ICD-10-CM | POA: Diagnosis not present

## 2013-05-04 DIAGNOSIS — Z79899 Other long term (current) drug therapy: Secondary | ICD-10-CM | POA: Insufficient documentation

## 2013-05-04 DIAGNOSIS — F411 Generalized anxiety disorder: Secondary | ICD-10-CM | POA: Diagnosis not present

## 2013-05-04 DIAGNOSIS — G9050 Complex regional pain syndrome I, unspecified: Secondary | ICD-10-CM | POA: Insufficient documentation

## 2013-05-04 DIAGNOSIS — Z9889 Other specified postprocedural states: Secondary | ICD-10-CM | POA: Insufficient documentation

## 2013-05-04 DIAGNOSIS — IMO0002 Reserved for concepts with insufficient information to code with codable children: Secondary | ICD-10-CM | POA: Diagnosis not present

## 2013-05-04 DIAGNOSIS — S139XXA Sprain of joints and ligaments of unspecified parts of neck, initial encounter: Secondary | ICD-10-CM | POA: Insufficient documentation

## 2013-05-04 DIAGNOSIS — S0083XA Contusion of other part of head, initial encounter: Secondary | ICD-10-CM | POA: Diagnosis not present

## 2013-05-04 DIAGNOSIS — F17200 Nicotine dependence, unspecified, uncomplicated: Secondary | ICD-10-CM | POA: Diagnosis not present

## 2013-05-04 DIAGNOSIS — Y9339 Activity, other involving climbing, rappelling and jumping off: Secondary | ICD-10-CM | POA: Insufficient documentation

## 2013-05-04 DIAGNOSIS — Z23 Encounter for immunization: Secondary | ICD-10-CM | POA: Insufficient documentation

## 2013-05-04 DIAGNOSIS — W1809XA Striking against other object with subsequent fall, initial encounter: Secondary | ICD-10-CM | POA: Insufficient documentation

## 2013-05-04 DIAGNOSIS — S0993XA Unspecified injury of face, initial encounter: Secondary | ICD-10-CM | POA: Diagnosis not present

## 2013-05-04 DIAGNOSIS — S1093XA Contusion of unspecified part of neck, initial encounter: Secondary | ICD-10-CM

## 2013-05-04 DIAGNOSIS — S0003XA Contusion of scalp, initial encounter: Secondary | ICD-10-CM | POA: Insufficient documentation

## 2013-05-04 DIAGNOSIS — Y9229 Other specified public building as the place of occurrence of the external cause: Secondary | ICD-10-CM | POA: Insufficient documentation

## 2013-05-05 ENCOUNTER — Encounter (HOSPITAL_COMMUNITY): Payer: Self-pay | Admitting: Emergency Medicine

## 2013-05-05 ENCOUNTER — Emergency Department (HOSPITAL_COMMUNITY): Payer: Medicare Other

## 2013-05-05 ENCOUNTER — Emergency Department (HOSPITAL_COMMUNITY)
Admission: EM | Admit: 2013-05-05 | Discharge: 2013-05-05 | Disposition: A | Discharge status: Home / Self Care | Payer: Medicare Other | Attending: Emergency Medicine | Admitting: Emergency Medicine

## 2013-05-05 DIAGNOSIS — S161XXA Strain of muscle, fascia and tendon at neck level, initial encounter: Secondary | ICD-10-CM

## 2013-05-05 DIAGNOSIS — S0101XA Laceration without foreign body of scalp, initial encounter: Secondary | ICD-10-CM

## 2013-05-05 DIAGNOSIS — S39012A Strain of muscle, fascia and tendon of lower back, initial encounter: Secondary | ICD-10-CM

## 2013-05-05 DIAGNOSIS — S0993XA Unspecified injury of face, initial encounter: Secondary | ICD-10-CM | POA: Diagnosis not present

## 2013-05-05 DIAGNOSIS — IMO0002 Reserved for concepts with insufficient information to code with codable children: Secondary | ICD-10-CM | POA: Diagnosis not present

## 2013-05-05 DIAGNOSIS — S0003XA Contusion of scalp, initial encounter: Secondary | ICD-10-CM

## 2013-05-05 DIAGNOSIS — S0100XA Unspecified open wound of scalp, initial encounter: Secondary | ICD-10-CM | POA: Diagnosis not present

## 2013-05-05 DIAGNOSIS — M545 Low back pain, unspecified: Secondary | ICD-10-CM | POA: Diagnosis not present

## 2013-05-05 LAB — GLUCOSE, CAPILLARY: GLUCOSE-CAPILLARY: 94 mg/dL (ref 70–99)

## 2013-05-05 MED ORDER — OXYCODONE-ACETAMINOPHEN 5-325 MG PO TABS
1.0000 | ORAL_TABLET | Freq: Once | ORAL | Status: AC
  Administered 2013-05-05: 1 via ORAL
  Filled 2013-05-05: qty 1

## 2013-05-05 MED ORDER — IBUPROFEN 800 MG PO TABS
800.0000 mg | ORAL_TABLET | Freq: Once | ORAL | Status: AC
  Administered 2013-05-05: 800 mg via ORAL
  Filled 2013-05-05: qty 1

## 2013-05-05 MED ORDER — TETANUS-DIPHTH-ACELL PERTUSSIS 5-2.5-18.5 LF-MCG/0.5 IM SUSP
0.5000 mL | Freq: Once | INTRAMUSCULAR | Status: AC
  Administered 2013-05-05: 0.5 mL via INTRAMUSCULAR
  Filled 2013-05-05: qty 0.5

## 2013-05-05 MED ORDER — PROMETHAZINE HCL 12.5 MG PO TABS
25.0000 mg | ORAL_TABLET | Freq: Once | ORAL | Status: AC
  Administered 2013-05-05: 25 mg via ORAL
  Filled 2013-05-05: qty 2

## 2013-05-05 MED ORDER — LIDOCAINE HCL (PF) 2 % IJ SOLN
10.0000 mL | Freq: Once | INTRAMUSCULAR | Status: AC
  Administered 2013-05-05: 10 mL
  Filled 2013-05-05: qty 10
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
## 2013-05-05 NOTE — Discharge Instructions (Signed)
The CT scan of your head and neck are both within normal limits. No changes in your surgical sites. The x-ray of your lumbar spine is negative for any acute condition.  Staple Wound Closure Staples are used to help a wound heal faster by holding the edges of the wound together. HOME CARE  Keep the area around the staples clean and dry.  Rest and raise (elevate) the injured part above the level of your heart.  See your doctor for a follow-up check of the wound.  See your doctor to have the staples removed.  Clean the wound daily with water.  Do not soak the wound in water for long periods of time.  Let air reach the wound as it heals. GET HELP RIGHT AWAY IF:   You have redness or puffiness around the wound.  You have a red line going away from the wound.  You have more pain or tenderness.  You have yellowish-white fluid (pus) coming from the wound.  Your wound does not stay together after the staples have been taken out.  You see something coming out of the wound, such as wood or glass.  You have problems moving the injured area.  You have a fever or lasting symptoms for more than 2-3 days.  You have a fever and your symptoms suddenly get worse. MAKE SURE YOU:   Understand these instructions.  Will watch this condition.  Will get help right away if you are not doing well or get worse. Document Released: 12/26/2007 Document Revised: 12/11/2011 Document Reviewed: 09/29/2011 [facility] Patient Information 2014 [place]. Please have your staples removed in 7 days. You may continue your current medications. Please see Dr. Riley Kill for followup and recheck. Return to the emergency department if any emergent changes in your condition.
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
## 2013-05-05 NOTE — ED Notes (Signed)
Placed neck brace on patient.

## 2013-05-05 NOTE — ED Provider Notes (Signed)
Medical screening examination/treatment/procedure(s) were performed by non-physician practitioner and as supervising physician I was immediately available for consultation/collaboration.  EKG Interpretation   None        Orlie Dakin, MD 05/05/13 [phone]

## 2013-05-12 DIAGNOSIS — B373 Candidiasis of vulva and vagina: Secondary | ICD-10-CM | POA: Diagnosis not present

## 2013-05-12 DIAGNOSIS — F411 Generalized anxiety disorder: Secondary | ICD-10-CM | POA: Diagnosis not present

## 2013-05-12 DIAGNOSIS — B3731 Acute candidiasis of vulva and vagina: Secondary | ICD-10-CM | POA: Diagnosis not present

## 2013-05-12 DIAGNOSIS — G8929 Other chronic pain: Secondary | ICD-10-CM | POA: Diagnosis not present

## 2013-05-12 DIAGNOSIS — L259 Unspecified contact dermatitis, unspecified cause: Secondary | ICD-10-CM | POA: Diagnosis not present

## 2013-05-12 DIAGNOSIS — Z6827 Body mass index (BMI) 27.0-27.9, adult: Secondary | ICD-10-CM | POA: Diagnosis not present

## 2013-05-31 DIAGNOSIS — M546 Pain in thoracic spine: Secondary | ICD-10-CM | POA: Diagnosis not present

## 2013-05-31 DIAGNOSIS — N62 Hypertrophy of breast: Secondary | ICD-10-CM | POA: Diagnosis not present

## 2013-05-31 DIAGNOSIS — M542 Cervicalgia: Secondary | ICD-10-CM | POA: Diagnosis not present

## 2013-06-04 DIAGNOSIS — L259 Unspecified contact dermatitis, unspecified cause: Secondary | ICD-10-CM | POA: Diagnosis not present

## 2013-06-04 DIAGNOSIS — L299 Pruritus, unspecified: Secondary | ICD-10-CM | POA: Diagnosis not present

## 2013-06-09 DIAGNOSIS — L258 Unspecified contact dermatitis due to other agents: Secondary | ICD-10-CM | POA: Diagnosis not present

## 2013-06-09 DIAGNOSIS — G8929 Other chronic pain: Secondary | ICD-10-CM | POA: Diagnosis not present

## 2013-06-09 DIAGNOSIS — L738 Other specified follicular disorders: Secondary | ICD-10-CM | POA: Diagnosis not present

## 2013-06-09 DIAGNOSIS — Z6827 Body mass index (BMI) 27.0-27.9, adult: Secondary | ICD-10-CM | POA: Diagnosis not present

## 2013-06-09 DIAGNOSIS — L678 Other hair color and hair shaft abnormalities: Secondary | ICD-10-CM | POA: Diagnosis not present

## 2013-07-09 DIAGNOSIS — G8929 Other chronic pain: Secondary | ICD-10-CM | POA: Diagnosis not present

## 2013-07-09 DIAGNOSIS — Z6827 Body mass index (BMI) 27.0-27.9, adult: Secondary | ICD-10-CM | POA: Diagnosis not present

## 2013-07-09 DIAGNOSIS — I10 Essential (primary) hypertension: Secondary | ICD-10-CM | POA: Diagnosis not present

## 2013-08-05 ENCOUNTER — Other Ambulatory Visit (HOSPITAL_COMMUNITY): Payer: Self-pay | Admitting: Internal Medicine

## 2013-08-05 DIAGNOSIS — Z6826 Body mass index (BMI) 26.0-26.9, adult: Secondary | ICD-10-CM | POA: Diagnosis not present

## 2013-08-05 DIAGNOSIS — IMO0002 Reserved for concepts with insufficient information to code with codable children: Secondary | ICD-10-CM | POA: Diagnosis not present

## 2013-08-05 DIAGNOSIS — S93699A Other sprain of unspecified foot, initial encounter: Secondary | ICD-10-CM | POA: Diagnosis not present

## 2013-08-05 DIAGNOSIS — E049 Nontoxic goiter, unspecified: Secondary | ICD-10-CM | POA: Diagnosis not present

## 2013-08-05 DIAGNOSIS — G8929 Other chronic pain: Secondary | ICD-10-CM | POA: Diagnosis not present

## 2013-08-09 ENCOUNTER — Ambulatory Visit (HOSPITAL_COMMUNITY): Payer: Medicare Other

## 2013-09-02 DIAGNOSIS — F411 Generalized anxiety disorder: Secondary | ICD-10-CM | POA: Diagnosis not present

## 2013-09-02 DIAGNOSIS — G8929 Other chronic pain: Secondary | ICD-10-CM | POA: Diagnosis not present

## 2013-09-02 DIAGNOSIS — G43809 Other migraine, not intractable, without status migrainosus: Secondary | ICD-10-CM | POA: Diagnosis not present

## 2013-09-02 DIAGNOSIS — Z6826 Body mass index (BMI) 26.0-26.9, adult: Secondary | ICD-10-CM | POA: Diagnosis not present

## 2013-09-30 ENCOUNTER — Other Ambulatory Visit (HOSPITAL_COMMUNITY): Payer: Self-pay | Admitting: Internal Medicine

## 2013-09-30 DIAGNOSIS — N63 Unspecified lump in unspecified breast: Secondary | ICD-10-CM

## 2013-09-30 DIAGNOSIS — G8929 Other chronic pain: Secondary | ICD-10-CM | POA: Diagnosis not present

## 2013-09-30 DIAGNOSIS — Z6826 Body mass index (BMI) 26.0-26.9, adult: Secondary | ICD-10-CM | POA: Diagnosis not present

## 2013-10-04 ENCOUNTER — Other Ambulatory Visit (HOSPITAL_COMMUNITY): Payer: Self-pay | Admitting: Internal Medicine

## 2013-10-04 DIAGNOSIS — Z1231 Encounter for screening mammogram for malignant neoplasm of breast: Secondary | ICD-10-CM

## 2013-10-05 ENCOUNTER — Encounter (HOSPITAL_COMMUNITY): Payer: Self-pay

## 2013-10-07 ENCOUNTER — Ambulatory Visit (HOSPITAL_COMMUNITY)
Admission: RE | Admit: 2013-10-07 | Discharge: 2013-10-07 | Disposition: A | Discharge status: Home / Self Care | Payer: Medicare Other | Source: Ambulatory Visit | Attending: Internal Medicine | Admitting: Internal Medicine

## 2013-10-07 DIAGNOSIS — Z1231 Encounter for screening mammogram for malignant neoplasm of breast: Secondary | ICD-10-CM | POA: Insufficient documentation

## 2013-12-08 DIAGNOSIS — G43909 Migraine, unspecified, not intractable, without status migrainosus: Secondary | ICD-10-CM | POA: Diagnosis not present

## 2013-12-08 DIAGNOSIS — F17200 Nicotine dependence, unspecified, uncomplicated: Secondary | ICD-10-CM | POA: Diagnosis not present

## 2013-12-08 DIAGNOSIS — M542 Cervicalgia: Secondary | ICD-10-CM | POA: Diagnosis not present

## 2013-12-08 DIAGNOSIS — G8929 Other chronic pain: Secondary | ICD-10-CM | POA: Diagnosis not present

## 2013-12-08 DIAGNOSIS — Z79899 Other long term (current) drug therapy: Secondary | ICD-10-CM | POA: Diagnosis not present

## 2013-12-08 DIAGNOSIS — M549 Dorsalgia, unspecified: Secondary | ICD-10-CM | POA: Diagnosis not present

## 2013-12-13 DIAGNOSIS — Z6825 Body mass index (BMI) 25.0-25.9, adult: Secondary | ICD-10-CM | POA: Diagnosis not present

## 2013-12-13 DIAGNOSIS — F329 Major depressive disorder, single episode, unspecified: Secondary | ICD-10-CM | POA: Diagnosis not present

## 2013-12-13 DIAGNOSIS — E049 Nontoxic goiter, unspecified: Secondary | ICD-10-CM | POA: Diagnosis not present

## 2013-12-13 DIAGNOSIS — F411 Generalized anxiety disorder: Secondary | ICD-10-CM | POA: Diagnosis not present

## 2013-12-13 DIAGNOSIS — G8929 Other chronic pain: Secondary | ICD-10-CM | POA: Diagnosis not present

## 2013-12-13 DIAGNOSIS — F3289 Other specified depressive episodes: Secondary | ICD-10-CM | POA: Diagnosis not present

## 2013-12-15 ENCOUNTER — Other Ambulatory Visit (HOSPITAL_COMMUNITY): Payer: Self-pay

## 2013-12-15 ENCOUNTER — Ambulatory Visit (HOSPITAL_COMMUNITY)
Admission: RE | Admit: 2013-12-15 | Discharge: 2013-12-15 | Disposition: A | Discharge status: Home / Self Care | Payer: Medicare Other | Source: Ambulatory Visit | Attending: Internal Medicine | Admitting: Internal Medicine

## 2013-12-15 DIAGNOSIS — E049 Nontoxic goiter, unspecified: Secondary | ICD-10-CM | POA: Diagnosis not present

## 2013-12-15 DIAGNOSIS — E042 Nontoxic multinodular goiter: Secondary | ICD-10-CM | POA: Diagnosis not present

## 2013-12-24 DIAGNOSIS — E785 Hyperlipidemia, unspecified: Secondary | ICD-10-CM | POA: Diagnosis not present

## 2014-01-07 DIAGNOSIS — E042 Nontoxic multinodular goiter: Secondary | ICD-10-CM | POA: Diagnosis not present

## 2014-01-10 ENCOUNTER — Encounter (HOSPITAL_COMMUNITY): Payer: Self-pay | Admitting: Emergency Medicine

## 2014-01-10 ENCOUNTER — Emergency Department (HOSPITAL_COMMUNITY)
Admission: EM | Admit: 2014-01-10 | Discharge: 2014-01-11 | Disposition: A | Discharge status: Home / Self Care | Payer: Medicare Other | Attending: Emergency Medicine | Admitting: Emergency Medicine

## 2014-01-10 ENCOUNTER — Other Ambulatory Visit (HOSPITAL_COMMUNITY): Payer: Self-pay | Admitting: "Endocrinology

## 2014-01-10 DIAGNOSIS — Z720 Tobacco use: Secondary | ICD-10-CM | POA: Diagnosis not present

## 2014-01-10 DIAGNOSIS — Z79899 Other long term (current) drug therapy: Secondary | ICD-10-CM | POA: Diagnosis not present

## 2014-01-10 DIAGNOSIS — K529 Noninfective gastroenteritis and colitis, unspecified: Secondary | ICD-10-CM | POA: Diagnosis not present

## 2014-01-10 DIAGNOSIS — Z8669 Personal history of other diseases of the nervous system and sense organs: Secondary | ICD-10-CM | POA: Diagnosis not present

## 2014-01-10 DIAGNOSIS — E041 Nontoxic single thyroid nodule: Secondary | ICD-10-CM

## 2014-01-10 DIAGNOSIS — N390 Urinary tract infection, site not specified: Secondary | ICD-10-CM

## 2014-01-10 DIAGNOSIS — F419 Anxiety disorder, unspecified: Secondary | ICD-10-CM | POA: Diagnosis not present

## 2014-01-10 DIAGNOSIS — Z792 Long term (current) use of antibiotics: Secondary | ICD-10-CM | POA: Insufficient documentation

## 2014-01-10 DIAGNOSIS — G8929 Other chronic pain: Secondary | ICD-10-CM | POA: Insufficient documentation

## 2014-01-10 DIAGNOSIS — R112 Nausea with vomiting, unspecified: Secondary | ICD-10-CM | POA: Diagnosis present

## 2014-01-10 LAB — CBC WITH DIFFERENTIAL/PLATELET
BASOS ABS: 0 10*3/uL (ref 0.0–0.1)
Basophils Relative: 0 % (ref 0–1)
Eosinophils Absolute: 0.2 10*3/uL (ref 0.0–0.7)
Eosinophils Relative: 2 % (ref 0–5)
HCT: 40.6 % (ref 36.0–46.0)
Hemoglobin: 13.7 g/dL (ref 12.0–15.0)
LYMPHS PCT: 41 % (ref 12–46)
Lymphs Abs: 4.2 10*3/uL — ABNORMAL HIGH (ref 0.7–4.0)
MCH: 31.3 pg (ref 26.0–34.0)
MCHC: 33.7 g/dL (ref 30.0–36.0)
MCV: 92.7 fL (ref 78.0–100.0)
Monocytes Absolute: 0.8 10*3/uL (ref 0.1–1.0)
Monocytes Relative: 8 % (ref 3–12)
NEUTROS ABS: 4.9 10*3/uL (ref 1.7–7.7)
NEUTROS PCT: 49 % (ref 43–77)
PLATELETS: 281 10*3/uL (ref 150–400)
RBC: 4.38 MIL/uL (ref 3.87–5.11)
RDW: 13.1 % (ref 11.5–15.5)
WBC: 10.1 10*3/uL (ref 4.0–10.5)

## 2014-01-10 LAB — URINALYSIS, ROUTINE W REFLEX MICROSCOPIC
BILIRUBIN URINE: NEGATIVE
Glucose, UA: NEGATIVE mg/dL
Hgb urine dipstick: NEGATIVE
KETONES UR: NEGATIVE mg/dL
NITRITE: NEGATIVE
PH: 7 (ref 5.0–8.0)
Protein, ur: NEGATIVE mg/dL
SPECIFIC GRAVITY, URINE: 1.015 (ref 1.005–1.030)
UROBILINOGEN UA: 0.2 mg/dL (ref 0.0–1.0)

## 2014-01-10 LAB — BASIC METABOLIC PANEL
ANION GAP: 10 (ref 5–15)
BUN: 9 mg/dL (ref 6–23)
CHLORIDE: 106 meq/L (ref 96–112)
CO2: 25 meq/L (ref 19–32)
Calcium: 8.8 mg/dL (ref 8.4–10.5)
Creatinine, Ser: 0.85 mg/dL (ref 0.50–1.10)
GFR calc Af Amer: >90 mL/min (ref >90)
GFR calc non Af Amer: 83 mL/min — ABNORMAL LOW (ref >90)
Glucose, Bld: 67 mg/dL — ABNORMAL LOW (ref 70–99)
POTASSIUM: 3.5 meq/L — AB (ref 3.7–5.3)
SODIUM: 141 meq/L (ref 137–147)

## 2014-01-10 LAB — URINE MICROSCOPIC-ADD ON

## 2014-01-10 LAB — LIPASE, BLOOD: Lipase: 29 U/L (ref 11–59)

## 2014-01-10 MED ORDER — SODIUM CHLORIDE 0.9 % IV BOLUS (SEPSIS)
1000.0000 mL | Freq: Once | INTRAVENOUS | Status: AC
  Administered 2014-01-10: 1000 mL via INTRAVENOUS

## 2014-01-10 MED ORDER — ONDANSETRON HCL 4 MG/2ML IJ SOLN
4.0000 mg | Freq: Once | INTRAMUSCULAR | Status: AC
  Administered 2014-01-10: 4 mg via INTRAVENOUS
  Filled 2014-01-10: qty 2

## 2014-01-10 MED ORDER — DEXTROSE 50 % IV SOLN: 50.0000 mL | Freq: Once | INTRAVENOUS | Status: DC

## 2014-01-10 MED ORDER — PROMETHAZINE HCL 25 MG PO TABS: 25.0000 mg | ORAL_TABLET | Freq: Four times a day (QID) | ORAL | Status: DC | PRN

## 2014-01-10 MED ORDER — MORPHINE SULFATE 4 MG/ML IJ SOLN
4.0000 mg | Freq: Once | INTRAMUSCULAR | Status: AC
  Administered 2014-01-10: 4 mg via INTRAVENOUS
  Filled 2014-01-10: qty 1

## 2014-01-10 MED ORDER — CEFTRIAXONE SODIUM 1 G IJ SOLR
1.0000 g | Freq: Once | INTRAMUSCULAR | Status: AC
  Administered 2014-01-10: 1 g via INTRAVENOUS
  Filled 2014-01-10: qty 10

## 2014-01-10 MED ORDER — NITROFURANTOIN MONOHYD MACRO 100 MG PO CAPS: 100.0000 mg | ORAL_CAPSULE | Freq: Two times a day (BID) | ORAL | Status: DC

## 2014-01-10 NOTE — ED Provider Notes (Signed)
CSN: [id number]     Arrival date & time 01/10/14  1913 History  This chart was scribed for Nat Christen, MD by Tula Nakayama, ED Scribe. This patient was seen in room APA05/[facility]05 and the patient's care was started at 8:46 PM.    Chief Complaint  Patient presents with  . Emesis   The history is provided by the patient. No language interpreter was used.   HPI Comments: [name] is a 43 y.o. female who presents to the Emergency Department complaining of vomiting, diarrhea, and generalized weakness that started yesterday. Pt states 4 episodes of vomiting and several episodes of diarrhea since the onset of symptoms.  Pt denies a history of similar symptoms. Pt has a recent diagnosis of anemia. She also has 2 cysts on thyroid and degenerative disk disease. Pt has history of cholecystectomy, appendectomy, hysterectomy, right oophorectomy, fusion of lower back, two fusions of neck, eye surgery at 2 months of age. Pt has lost 50 lbs over 6 months without trying. She states anxiety and nervousness as associated symptoms with the weight loss.  PCP is Dr. Gerarda Fraction  Past Medical History  Diagnosis Date  . Anxiety   . Reflex sympathetic dystrophy   . Chronic back pain   . Chronic neck pain    Past Surgical History  Procedure Laterality Date  . Neck surgery      plate in neck  . Abdominal hysterectomy      partial  . Back surgery      plates and screws  . Knee arthroscopy Left   . Cyst excision Left     foot  . Salpingoophorectomy Right   . Breast surgery Left     fibrocystic tumor  . Strabismus surgery Bilateral   . Ear cyst excision Right 01/27/2013    Procedure: EXCISION CYST ON SCALP ;  Surgeon: Jamesetta So, MD;  Location: [facility];  Service: General;  Laterality: Right;  . Ear cyst excision Right 01/27/2013    Procedure: CYST REMOVAL right axilla;  Surgeon: Jamesetta So, MD;  Location: [facility];  Service: General;  Laterality: Right;   History reviewed. No pertinent family  history. History  Substance Use Topics  . Smoking status: Current Every Day Smoker -- 1.00 packs/day for 20 years    Types: Cigarettes  . Smokeless tobacco: Never Used  . Alcohol Use: Yes     Comment: occasionally   OB History   Grav Para Term Preterm Abortions TAB SAB Ect Mult Living                 Review of Systems  Gastrointestinal: Positive for nausea, vomiting and diarrhea.    10 Systems reviewed and all are negative for acute change except as noted in the HPI.   Allergies  Darvocet  Home Medications   Prior to Admission medications   Medication Sig Start Date End Date Taking? Authorizing Provider  alprazolam Duanne Moron) 2 MG tablet Take 1 tablet (2 mg total) by mouth 4 (four) times daily as needed for anxiety. 03/29/13  Yes Carmin Muskrat, MD  citalopram (CELEXA) 40 MG tablet Take 40 mg by mouth daily.   Yes Historical Provider, MD  cyclobenzaprine (FLEXERIL) 10 MG tablet Take 10 mg by mouth 3 (three) times daily as needed for muscle spasms.   Yes Historical Provider, MD  diazepam (VALIUM) 10 MG tablet Take 10 mg by mouth 4 (four) times daily. 12/20/13  Yes Historical Provider, MD  NEXIUM 40 MG capsule Take  40 mg by mouth daily. 03/11/13  Yes Historical Provider, MD  oxyCODONE (ROXICODONE) 15 MG immediate release tablet Take 1 tablet (15 mg total) by mouth every 6 (six) hours as needed for pain. 03/29/13  Yes Carmin Muskrat, MD  oxycodone (ROXICODONE) 30 MG immediate release tablet Take 30 mg by mouth every 4 (four) hours as needed for pain.   Yes Historical Provider, MD  rizatriptan (MAXALT-MLT) 10 MG disintegrating tablet Take 1 tablet by mouth as needed. For migraine 01/05/13  Yes Historical Provider, MD  topiramate (TOPAMAX) 25 MG tablet Take 25 mg by mouth 2 (two) times daily. 03/11/13  Yes Historical Provider, MD  traZODone (DESYREL) 50 MG tablet Take 50-100 mg by mouth at bedtime as needed for sleep.   Yes Historical Provider, MD  nitrofurantoin,  macrocrystal-monohydrate, (MACROBID) 100 MG capsule Take 1 capsule (100 mg total) by mouth 2 (two) times daily. X 7 days 01/10/14   Nat Christen, MD  promethazine (PHENERGAN) 25 MG tablet Take 1 tablet (25 mg total) by mouth every 6 (six) hours as needed. 01/10/14   Nat Christen, MD   BP 101/71  Pulse 86  Temp(Src) 98.3 F (36.8 C) (Oral)  Resp 20  Ht 5\' 11"  (1.803 m)  Wt 160 lb (72.576 kg)  BMI 22.33 kg/m2  SpO2 100% Physical Exam  Nursing note and vitals reviewed. Constitutional: She is oriented to person, place, and time. She appears well-developed and well-nourished.  HENT:  Head: Normocephalic and atraumatic.  Eyes: Conjunctivae and EOM are normal. Pupils are equal, round, and reactive to light.  Neck: Normal range of motion. Neck supple.  Cardiovascular: Normal rate, regular rhythm and normal heart sounds.   Pulmonary/Chest: Effort normal and breath sounds normal.  Abdominal: Soft. Bowel sounds are normal.  Tenderness in periumbilical area  Tenderness thyroid gland  Musculoskeletal: Normal range of motion.  Neurological: She is alert and oriented to person, place, and time.  Skin: Skin is warm and dry.  Psychiatric: She has a normal mood and affect. Her behavior is normal.    ED Course  Procedures (including critical care time)  DIAGNOSTIC STUDIES: Oxygen Saturation is 100% on RA, normal by my interpretation.    COORDINATION OF CARE: 8:50 PM Discussed treatment plan with pt at bedside and pt agreed to plan.   Labs Review Labs Reviewed  CBC WITH DIFFERENTIAL - Abnormal; Notable for the following:    Lymphs Abs 4.2 (*)    All other components within normal limits  BASIC METABOLIC PANEL - Abnormal; Notable for the following:    Potassium 3.5 (*)    Glucose, Bld 67 (*)    GFR calc non Af Amer 83 (*)    All other components within normal limits  URINALYSIS, ROUTINE W REFLEX MICROSCOPIC - Abnormal; Notable for the following:    Leukocytes, UA MODERATE (*)    All other  components within normal limits  URINE MICROSCOPIC-ADD ON - Abnormal; Notable for the following:    Squamous Epithelial / LPF MANY (*)    Bacteria, UA MANY (*)    All other components within normal limits  URINE CULTURE  LIPASE, BLOOD    Imaging Review No results found.   EKG Interpretation None      MDM   Final diagnoses:  Gastroenteritis  UTI (lower urinary tract infection)    Patient feels better after 2 L of IV fluids.   Urinalysis shows 21-50 white cells. Rx IV Rocephin for urinary tract infection. Urine culture. Discharge medications Macrobid  and Phenergan 25 mg    I personally performed the services described in this documentation, which was scribed in my presence. The recorded information has been reviewed and is accurate.    Nat Christen, MD 01/10/14 [phone]

## 2014-01-10 NOTE — Discharge Instructions (Signed)
You have a urinary tract infection. Increase fluids. Start antibiotic tomorrow evening. Medication for nausea.

## 2014-01-10 NOTE — ED Notes (Signed)
Patient complaining of nausea, emesis, and generalized weakness that started yesterday. Patient reports "I'm anemic and my doctor told me I needed to come have blood work done."

## 2014-01-12 LAB — URINE CULTURE: Special Requests: NORMAL

## 2014-01-20 ENCOUNTER — Ambulatory Visit (HOSPITAL_COMMUNITY)
Admission: RE | Admit: 2014-01-20 | Discharge: 2014-01-20 | Disposition: A | Discharge status: Home / Self Care | Payer: Medicare Other | Source: Ambulatory Visit | Attending: "Endocrinology | Admitting: "Endocrinology

## 2014-01-20 ENCOUNTER — Encounter (HOSPITAL_COMMUNITY): Payer: Self-pay

## 2014-01-20 DIAGNOSIS — E079 Disorder of thyroid, unspecified: Secondary | ICD-10-CM | POA: Diagnosis not present

## 2014-01-20 DIAGNOSIS — E041 Nontoxic single thyroid nodule: Secondary | ICD-10-CM | POA: Diagnosis not present

## 2014-01-20 MED ORDER — LIDOCAINE HCL (PF) 2 % IJ SOLN
10.0000 mL | Freq: Once | INTRAMUSCULAR | Status: AC
  Administered 2014-01-20: 10 mL

## 2014-01-20 NOTE — Discharge Instructions (Signed)
Thyroid Biopsy °The thyroid gland is a butterfly-shaped gland situated in the front of the neck. It produces hormones which affect metabolism, growth and development, and body temperature. A thyroid biopsy is a procedure in which small samples of tissue or fluid are removed from the thyroid gland or mass and examined under a microscope. This test is done to determine the cause of thyroid problems, such as infection, cancer, or other thyroid problems. °There are 2 ways to obtain samples: °1. Fine needle biopsy. Samples are removed using a thin needle inserted through the skin and into the thyroid gland or mass. °2. Open biopsy. Samples are removed after a cut (incision) is made through the skin. °LET YOUR CAREGIVER KNOW ABOUT:  °· Allergies. °· Medications taken including herbs, eye drops, over-the-counter medications, and creams. °· Use of steroids (by mouth or creams). °· Previous problems with anesthetics or numbing medicine. °· Possibility of pregnancy, if this applies. °· History of blood clots (thrombophlebitis). °· History of bleeding or blood problems. °· Previous surgery. °· Other health problems. °RISKS AND COMPLICATIONS °· Bleeding from the site. The risk of bleeding is higher if you have a bleeding disorder or are taking any blood thinning medications (anticoagulants). °· Infection. °· Injury to structures near the thyroid gland. °BEFORE THE PROCEDURE  °This is a procedure that can be done as an outpatient. Confirm the time that you need to arrive for your procedure. Confirm whether there is a need to fast or withhold any medications. A blood sample may be done to determine your blood clotting time. Medicine may be given to help you relax (sedative). °PROCEDURE °Fine needle biopsy. °You will be awake during the procedure. You may be asked to lie on your back with your head tipped backward to extend your neck. Let your caregiver know if you cannot tolerate the positioning. An area on your neck will be  cleansed. A needle is inserted through the skin of your neck. You may feel a mild discomfort during this procedure. You may be asked to avoid coughing, talking, swallowing, or making sounds during some portions of the procedure. The needle is withdrawn once tissue or fluid samples have been removed. Pressure may be applied to the neck to reduce swelling and ensure that bleeding has stopped. The samples will be sent for examination.  °Open biopsy. °You will be given general anesthesia. You will be asleep during the procedure. An incision is made in your neck. A sample of thyroid tissue or the mass is removed. The tissue sample or mass will be sent for examination. The sample or mass may be examined during the biopsy. If the sample or mass contains cancer cells, some or all of the thyroid gland may be removed. The incision is closed with stitches. °AFTER THE PROCEDURE  °Your recovery will be assessed and monitored. If there are no problems, as an outpatient, you should be able to go home shortly after the procedure. °If you had a fine needle biopsy: °· You may have soreness at the biopsy site for 1 to 2 days. °If you had an open biopsy:  °· You may have soreness at the biopsy site for 3 to 4 days. °· You may have a hoarse voice or sore throat for 1 to 2 days. °Obtaining the Test Results °It is your responsibility to obtain your test results. Do not assume everything is normal if you have not heard from your caregiver or the medical facility. It is important for you to follow up   on all of your test results. °HOME CARE INSTRUCTIONS  °· Keeping your head raised on a pillow when you are lying down may ease biopsy site discomfort. °· Supporting the back of your head and neck with both hands as you sit up from a lying position may ease biopsy site discomfort. °· Only take over-the-counter or prescription medicines for pain, discomfort, or fever as directed by your caregiver. °· Throat lozenges or gargling with warm salt  water may help to soothe a sore throat. °SEEK IMMEDIATE MEDICAL CARE IF:  °· You have severe bleeding from the biopsy site. °· You have difficulty swallowing. °· You have a fever. °· You have increased pain, swelling, redness, or warmth at the biopsy site. °· You notice pus coming from the biopsy site. °· You have swollen glands (lymph nodes) in your neck. °Document Released: 01/13/2007 Document Revised: 07/13/2012 Document Reviewed: 06/10/2013 °[employer]® Patient Information ©2015 [employer]. This information is not intended to replace advice given to you by your health care provider. Make sure you discuss any questions you have with your health care provider. ° °

## 2014-01-24 DIAGNOSIS — E049 Nontoxic goiter, unspecified: Secondary | ICD-10-CM | POA: Diagnosis not present

## 2014-02-15 DIAGNOSIS — D497 Neoplasm of unspecified behavior of endocrine glands and other parts of nervous system: Secondary | ICD-10-CM | POA: Diagnosis not present

## 2014-02-16 NOTE — H&P (Signed)
  NTS SOAP Note  Vital Signs:  Vitals as of: 87/86/7672: Systolic 094: Diastolic 62: Heart Rate 74: Temp 97.3F: Height 26ft 10in: Weight 178Lbs 0 Ounces: Pain Level 4: BMI 25.54  BMI : 25.54 kg/m2  Subjective: This 43 year old female presents for of a multinodular goiter.  Has been present for some time,  but is increasing in size and causing pressure sensation in the neck.  Does have some dysphagia.  No airway difficulties.  No h/o neck irradiation,  heat intolerance,  heart palpitations,  weight changes.   Review of Symptoms:  Constitutional:fatigue headaches Eyes:unremarkable   sinus problems Cardiovascular:  unremarkable Respiratory:unremarkable Gastrointestinheartburn Genitourinary:unremarkable   joint,  neck,  and back pain Skin:unremarkable Hematolgic/Lymphatic:unremarkable   Allergic/Immunologic:unremarkable   Past Medical History:  Reviewed  Past Medical History  Surgical History: back fusion, neck fusion, TAH,  cholecystectomy Medical Problems: chronic pain Psychiatric History:  Anxiety Allergies: nkda Medications: tramadol,  celexa, cyclobenzaprin,  oxycodone, trazdone, xanax   Social History:Reviewed  Social History  Preferred Language: English Race:  White Ethnicity: Hispanic / Latino Age: 65 Years 3 Months Marital Status:  D Alcohol:  No Recreational drug(s):  No   Smoking Status: Current every day smoker reviewed on 02/15/2014 Started Date: 04/01/1990 Packs per day: 0.50 Functional Status reviewed on 02/15/2014 ------------------------------------------------ Bathing: Normal Cooking: Normal Dressing: Normal Driving: Normal Eating: Normal Managing Meds: Normal Oral Care: Normal Shopping: Normal Toileting: Normal Transferring: Normal Walking: Normal Cognitive Status reviewed on 02/15/2014 ------------------------------------------------ Attention: Normal Decision Making: Normal Language: Normal Memory: Normal Motor:  Normal Perception: Normal Problem Solving: Normal Visual and Spatial: Normal   Family History:Reviewed  Family Health History Mother, Living; Healthy; healthy Father, Living; Parkinson's disease;     Objective Information: General:Well appearing, well nourished in no distress. Elanged thyroid,  R>>L,  with nodularity noted in right lobe,  no lymphadenopathy noted. Heart:RRR, no murmur or gallop.  Normal S1, S2.  No S3, S4.  Lungs:  CTA bilaterally, no wheezes, rhonchi, rales.  Breathing unlabored. FNA of right lobe of thyroid shows follicular neoplasm,  7.0JG in greatest diameter  Assessment:Thyroid neoplasm of unspecified behavior,  multinodular goiter  Diagnoses: 283.6  O29.4 Follicular neoplasm of thyroid (Neoplasm of unspecified behavior of endocrine glands and other parts of nervous system)  Procedures: 99214 - OFFICE OUTPATIENT VISIT 25 MINUTES    Plan:  Scheduled for total thyroidectomy on 03/14/14.   Patient Education:Alternative treatments to surgery were discussed with patient (and family).  Risks and benefits  of procedure including bleeding,  infection,  nerve injury,  voice changes,  and low calcium levels were fully explained to the patient (and family) who gave informed consent. Patient/family questions were addressed.  Follow-up:Pending Surgery

## 2014-03-08 NOTE — Patient Instructions (Signed)
[name]  03/08/2014   Your procedure is scheduled on:  03/14/2014  Report to [facility] at  10  AM.  Call this number if you have problems the morning of surgery: [phone]   Remember:   Do not eat food or drink liquids after midnight.   Take these medicines the morning of surgery with A SIP OF WATER:  Nexium, xanax, celexa, flexaril, valium, oxycodone, phenergan. Maxalt and topamax if needed.   Do not wear jewelry, make-up or nail polish.  Do not wear lotions, powders, or perfumes.   Do not shave 48 hours prior to surgery. Men may shave face and neck.  Do not bring valuables to the hospital.  [facility] is not responsible for any belongings or valuables.               Contacts, dentures or bridgework may not be worn into surgery.  Leave suitcase in the car. After surgery it may be brought to your room.  For patients admitted to the hospital, discharge time is determined by your treatment team.               Patients discharged the day of surgery will not be allowed to drive home.  Name and phone number of your driver: family Special Instructions: Shower using CHG 2 nights before surgery and the night before surgery.  If you shower the day of surgery use CHG.  Use special wash - you have one bottle of CHG for all showers.  You should use approximately 1/3 of the bottle for each shower.   Please read over the following fact sheets that you were given: Pain Booklet, Coughing and Deep Breathing, Surgical Site Infection Prevention, Anesthesia Post-op Instructions and Care and Recovery After Surgery Thyroidectomy Thyroidectomy is the removal of part or all of your thyroid gland. Your thyroid gland is a butterfly-shaped gland at the base of your neck. It produces a substance called thyroid hormone, which regulates the physical and chemical processes that keep your body functioning and make energy available to your body (metabolism). The amount of thyroid gland tissue that  is removed during a thyroidectomy depends on the reason for the procedure. Typically, if only a part of your gland is removed, enough thyroid gland tissue remains to maintain normal function. If your entire thyroid gland is removed or if the amount of thyroid gland tissue remaining is inadequate to maintain normal function, you will need life-long treatment with thyroid hormone on a daily basis. Thyroidectomy maybe performed when you have the following conditions:  Thyroid nodules. These are small, abnormal collections of tissue that form inside the thyroid gland. If these nodules begin to enlarge at a rapid rate, a sample of tissue from the nodule is taken through a needle and examined (needle biopsy). This is done to determine if the nodules are cancerous. Depending on the outcome of this exam, thyroidectomy may be necessary.  Thyroid cancer.  Goiter, which is an enlarged thyroid gland. All or part of the thyroid gland may be removed if the gland has become so large that it causes difficulty breathing or swallowing.  Hyperthyroidism. This is when the thyroid gland produces too much thyroid hormone. Hypothyroidism can cause symptoms of fluctuating weight, intolerance to heat, irritability, shortness of breath, and chest pain. LET YOUR CAREGIVER KNOW ABOUT:   Allergies to food or medicine.  Medicines that you are taking, including vitamins, herbs, eyedrops, over-the-counter medicines, and creams.  Previous problems you  have had with anesthetics or numbing medicines.  History of bleeding problems or blood clots.  Previous surgeries you have had.  Other health problems, including diabetes and kidney problems, you have had.  Possibility of pregnancy, if this applies. BEFORE THE PROCEDURE   Do not eat or drink anything, including water, for at least 6 hours before the procedure.  Ask your caregiver whether you should stop taking certain medicines before the day of the procedure. PROCEDURE    There are different ways that thyroidectomy is performed. For each type, you will be given a medicine to make you sleep (general anesthetic). The three main types of thyroidectomy are listed as follows:  Conventional thyroidectomy--A cut (incision) in the center portion of your lower neck is made with a scalpel. Muscles below your skin are separated to gain access to your thyroid gland. Your thyroid gland is dissected from your windpipe (trachea). Often a drain is placed at the incision site to drain any blood that accumulates under the skin after the procedure. This drain will be removed before you go home. The wound from the incision should heal within 2 weeks.  Endoscopic thyroidectomy--Small incisions are made in your lower neck. A small instrument (endoscope) is inserted under your skin at the incision sites. The endoscope used for thyroidectomy consists of 2 flexible tubes. Inside one of the tubes is a video camera that is used to guide the Psychologist, sport and exercise. Tools to remove the thyroid gland, including a tool to cut the gland (dissectors) and a suction device, are inserted through the other tube. The surgeon uses the dissectors to dissect the thyroid gland from the trachea and remove it.  Robotic thyroidectomy--This procedure allows your thyroid gland to be removed through incisions in your armpit, your chest, or high in your neck. Instruments similar to endoscopes provide a 3-dimensional picture of the surgical site. Dissecting instruments are controlled by devices similar to joysticks. These devices allow more accurate manipulation of the instruments. After the blood supply to the gland is removed, the gland is cut into several pieces and removed through the incisions. RISKS AND COMPLICATIONS Complications associated with thyroidectomy are rare, but they can occur. Possible complications include:  A decrease in parathyroid hormone levels (hypoparathyroidism)--Your parathyroid glands are located close  behind your thyroid gland. They are responsible for maintaining calcium levels inthe body. If they are damaged or removed, levels of calcium in the blood become low and nerves become irritable, which can cause muscle spasms. Medicines are available to treat this.  Bacterial infection--This can often be treated with medicines that kill bacteria (antibiotics).  Damage to your voice box nerves--This could cause hoarseness or complete loss of voice.  Bleeding or airway obstruction. AFTER THE PROCEDURE   You will rest in the recovery room as you wake up.  When you first wake up, your throat may feel slightly sore.  You will not be allowed to eat or drink until instructed otherwise.  You will be taken to your hospital room. You will usually stay at the hospital for 1 or 2 nights.  If a drain is placed during the procedure, it usually is removed the next day.  You may have some mild neck pain.  Your voice may be weak. This usually is temporary. Document Released: 09/11/2000 Document Revised: 07/13/2012 Document Reviewed: 06/20/2010 [facility] Patient Information 2015 [place], [place]. This information is not intended to replace advice given to you by your health care provider. Make sure you discuss any questions you have with your  health care provider. PATIENT INSTRUCTIONS POST-ANESTHESIA  IMMEDIATELY FOLLOWING SURGERY:  Do not drive or operate machinery for the first twenty four hours after surgery.  Do not make any important decisions for twenty four hours after surgery or while taking narcotic pain medications or sedatives.  If you develop intractable nausea and vomiting or a severe headache please notify your doctor immediately.  FOLLOW-UP:  Please make an appointment with your surgeon as instructed. You do not need to follow up with anesthesia unless specifically instructed to do so.  WOUND CARE INSTRUCTIONS (if applicable):  Keep a dry clean dressing on the anesthesia/puncture wound site  if there is drainage.  Once the wound has quit draining you may leave it open to air.  Generally you should leave the bandage intact for twenty four hours unless there is drainage.  If the epidural site drains for more than 36-48 hours please call the anesthesia department.  QUESTIONS?:  Please feel free to call your physician or the hospital operator if you have any questions, and they will be happy to assist you.

## 2014-03-09 ENCOUNTER — Encounter (HOSPITAL_COMMUNITY)
Admission: RE | Admit: 2014-03-09 | Discharge: 2014-03-09 | Disposition: A | Discharge status: Home / Self Care | Payer: Medicare Other | Source: Ambulatory Visit | Attending: General Surgery | Admitting: General Surgery

## 2014-03-09 ENCOUNTER — Encounter (HOSPITAL_COMMUNITY): Payer: Self-pay

## 2014-03-09 DIAGNOSIS — Z01812 Encounter for preprocedural laboratory examination: Secondary | ICD-10-CM | POA: Insufficient documentation

## 2014-03-09 HISTORY — DX: Gastro-esophageal reflux disease without esophagitis: K21.9

## 2014-03-09 HISTORY — DX: Headache: R51

## 2014-03-09 HISTORY — DX: Pure hypercholesterolemia, unspecified: E78.00

## 2014-03-09 HISTORY — DX: Headache, unspecified: R51.9

## 2014-03-09 LAB — CBC WITH DIFFERENTIAL/PLATELET
BASOS PCT: 0 % (ref 0–1)
Basophils Absolute: 0 10*3/uL (ref 0.0–0.1)
Eosinophils Absolute: 0.3 10*3/uL (ref 0.0–0.7)
Eosinophils Relative: 4 % (ref 0–5)
HEMATOCRIT: 40 % (ref 36.0–46.0)
Hemoglobin: 13.1 g/dL (ref 12.0–15.0)
LYMPHS ABS: 4.3 10*3/uL — AB (ref 0.7–4.0)
Lymphocytes Relative: 52 % — ABNORMAL HIGH (ref 12–46)
MCH: 30.5 pg (ref 26.0–34.0)
MCHC: 32.8 g/dL (ref 30.0–36.0)
MCV: 93 fL (ref 78.0–100.0)
MONO ABS: 0.6 10*3/uL (ref 0.1–1.0)
Monocytes Relative: 8 % (ref 3–12)
NEUTROS ABS: 2.9 10*3/uL (ref 1.7–7.7)
Neutrophils Relative %: 36 % — ABNORMAL LOW (ref 43–77)
Platelets: 198 10*3/uL (ref 150–400)
RBC: 4.3 MIL/uL (ref 3.87–5.11)
RDW: 12.1 % (ref 11.5–15.5)
WBC: 8.1 10*3/uL (ref 4.0–10.5)

## 2014-03-09 LAB — COMPREHENSIVE METABOLIC PANEL
ALBUMIN: 3.7 g/dL (ref 3.5–5.2)
ALK PHOS: 127 U/L — AB (ref 39–117)
ALT: 7 U/L (ref 0–35)
AST: 13 U/L (ref 0–37)
Anion gap: 11 (ref 5–15)
BUN: 12 mg/dL (ref 6–23)
CHLORIDE: 107 meq/L (ref 96–112)
CO2: 26 meq/L (ref 19–32)
CREATININE: 0.89 mg/dL (ref 0.50–1.10)
Calcium: 9.2 mg/dL (ref 8.4–10.5)
GFR, EST NON AFRICAN AMERICAN: 79 mL/min — AB (ref >90)
GLUCOSE: 71 mg/dL (ref 70–99)
POTASSIUM: 4 meq/L (ref 3.7–5.3)
Sodium: 144 mEq/L (ref 137–147)
Total Bilirubin: 0.2 mg/dL — ABNORMAL LOW (ref 0.3–1.2)
Total Protein: 6.6 g/dL (ref 6.0–8.3)

## 2014-03-09 LAB — T4: T4, Total: 6 ug/dL (ref 4.5–12.0)

## 2014-03-09 LAB — T3: T3, Total: 109.7 ng/dl (ref 80.0–204.0)

## 2014-03-09 LAB — TSH: TSH: 4.35 u[IU]/mL (ref 0.350–4.500)

## 2014-03-09 NOTE — Pre-Procedure Instructions (Signed)
Patient given information to sign up for my chart at home. 

## 2014-03-14 ENCOUNTER — Encounter (HOSPITAL_COMMUNITY)
Admission: RE | Disposition: A | Discharge status: Home / Self Care | Payer: Self-pay | Source: Ambulatory Visit | Attending: General Surgery

## 2014-03-14 ENCOUNTER — Ambulatory Visit (HOSPITAL_COMMUNITY): Payer: Medicare Other | Admitting: Anesthesiology

## 2014-03-14 ENCOUNTER — Observation Stay (HOSPITAL_COMMUNITY)
Admission: RE | Admit: 2014-03-14 | Discharge: 2014-03-15 | Disposition: A | Discharge status: Home / Self Care | Payer: Medicare Other | Source: Ambulatory Visit | Attending: General Surgery | Admitting: General Surgery

## 2014-03-14 ENCOUNTER — Encounter (HOSPITAL_COMMUNITY): Payer: Self-pay | Admitting: *Deleted

## 2014-03-14 DIAGNOSIS — D497 Neoplasm of unspecified behavior of endocrine glands and other parts of nervous system: Secondary | ICD-10-CM | POA: Diagnosis not present

## 2014-03-14 DIAGNOSIS — E041 Nontoxic single thyroid nodule: Principal | ICD-10-CM | POA: Insufficient documentation

## 2014-03-14 HISTORY — PX: THYROIDECTOMY: SHX17

## 2014-03-14 LAB — COMPREHENSIVE METABOLIC PANEL
ALBUMIN: 3.6 g/dL (ref 3.5–5.2)
ALK PHOS: 135 U/L — AB (ref 39–117)
ALT: 11 U/L (ref 0–35)
AST: 15 U/L (ref 0–37)
Anion gap: 9 (ref 5–15)
BUN: 11 mg/dL (ref 6–23)
CHLORIDE: 106 meq/L (ref 96–112)
CO2: 27 meq/L (ref 19–32)
Calcium: 8.7 mg/dL (ref 8.4–10.5)
Creatinine, Ser: 0.96 mg/dL (ref 0.50–1.10)
GFR calc Af Amer: 83 mL/min — ABNORMAL LOW (ref >90)
GFR calc non Af Amer: 72 mL/min — ABNORMAL LOW (ref >90)
Glucose, Bld: 149 mg/dL — ABNORMAL HIGH (ref 70–99)
Potassium: 4.7 mEq/L (ref 3.7–5.3)
Sodium: 142 mEq/L (ref 137–147)
Total Protein: 6.7 g/dL (ref 6.0–8.3)

## 2014-03-14 SURGERY — THYROIDECTOMY
Anesthesia: General | Site: Neck

## 2014-03-14 MED ORDER — LIDOCAINE HCL (PF) 1 % IJ SOLN
INTRAMUSCULAR | Status: AC
  Filled 2014-03-14: qty 5

## 2014-03-14 MED ORDER — ROCURONIUM BROMIDE 50 MG/5ML IV SOLN
INTRAVENOUS | Status: AC
  Filled 2014-03-14: qty 1

## 2014-03-14 MED ORDER — KETOROLAC TROMETHAMINE 30 MG/ML IJ SOLN
30.0000 mg | Freq: Once | INTRAMUSCULAR | Status: AC
  Administered 2014-03-14: 30 mg via INTRAVENOUS

## 2014-03-14 MED ORDER — GLYCOPYRROLATE 0.2 MG/ML IJ SOLN
INTRAMUSCULAR | Status: AC
  Filled 2014-03-14: qty 2

## 2014-03-14 MED ORDER — ROCURONIUM BROMIDE 100 MG/10ML IV SOLN
INTRAVENOUS | Status: DC | PRN
  Administered 2014-03-14: 35 mg via INTRAVENOUS
  Administered 2014-03-14: 10 mg via INTRAVENOUS
  Administered 2014-03-14: 5 mg via INTRAVENOUS

## 2014-03-14 MED ORDER — HYDROMORPHONE HCL 1 MG/ML IJ SOLN
1.0000 mg | INTRAMUSCULAR | Status: DC | PRN
  Administered 2014-03-14 – 2014-03-15 (×6): 1 mg via INTRAVENOUS
  Filled 2014-03-14 (×6): qty 1

## 2014-03-14 MED ORDER — NEOSTIGMINE METHYLSULFATE 10 MG/10ML IV SOLN
INTRAVENOUS | Status: AC
  Filled 2014-03-14: qty 1

## 2014-03-14 MED ORDER — PROPOFOL 10 MG/ML IV BOLUS
INTRAVENOUS | Status: DC | PRN
  Administered 2014-03-14: 125 mg via INTRAVENOUS

## 2014-03-14 MED ORDER — LIDOCAINE HCL 1 % IJ SOLN
INTRAMUSCULAR | Status: DC | PRN
  Administered 2014-03-14: 50 mg via INTRADERMAL
  Administered 2014-03-14: 40 mg via INTRADERMAL

## 2014-03-14 MED ORDER — HEMOSTATIC AGENTS (NO CHARGE) OPTIME
TOPICAL | Status: DC | PRN
  Administered 2014-03-14: 1 via TOPICAL

## 2014-03-14 MED ORDER — DEXAMETHASONE SODIUM PHOSPHATE 4 MG/ML IJ SOLN
4.0000 mg | Freq: Once | INTRAMUSCULAR | Status: AC
  Administered 2014-03-14: 4 mg via INTRAVENOUS
  Filled 2014-03-14: qty 1

## 2014-03-14 MED ORDER — TRAZODONE HCL 50 MG PO TABS: 50.0000 mg | ORAL_TABLET | Freq: Every evening | ORAL | Status: DC | PRN

## 2014-03-14 MED ORDER — ONDANSETRON HCL 4 MG/2ML IJ SOLN
4.0000 mg | Freq: Once | INTRAMUSCULAR | Status: AC
  Administered 2014-03-14: 4 mg via INTRAVENOUS
  Filled 2014-03-14: qty 2

## 2014-03-14 MED ORDER — LACTATED RINGERS IV SOLN
INTRAVENOUS | Status: DC
  Administered 2014-03-14 – 2014-03-15 (×2): via INTRAVENOUS

## 2014-03-14 MED ORDER — LACTATED RINGERS IV SOLN
INTRAVENOUS | Status: DC
  Administered 2014-03-14: 1000 mL via INTRAVENOUS

## 2014-03-14 MED ORDER — GLYCOPYRROLATE 0.2 MG/ML IJ SOLN
INTRAMUSCULAR | Status: DC | PRN
  Administered 2014-03-14: .5 mg via INTRAVENOUS

## 2014-03-14 MED ORDER — ONDANSETRON HCL 4 MG PO TABS: 4.0000 mg | ORAL_TABLET | Freq: Four times a day (QID) | ORAL | Status: DC | PRN

## 2014-03-14 MED ORDER — FENTANYL CITRATE 0.05 MG/ML IJ SOLN
INTRAMUSCULAR | Status: AC
  Filled 2014-03-14: qty 5

## 2014-03-14 MED ORDER — OXYCODONE HCL 5 MG PO TABS: 15.0000 mg | ORAL_TABLET | Freq: Four times a day (QID) | ORAL | Status: DC | PRN

## 2014-03-14 MED ORDER — CHLORHEXIDINE GLUCONATE 4 % EX LIQD: 1.0000 "application " | Freq: Once | CUTANEOUS | Status: DC

## 2014-03-14 MED ORDER — FENTANYL CITRATE 0.05 MG/ML IJ SOLN
25.0000 ug | INTRAMUSCULAR | Status: DC | PRN
  Administered 2014-03-14: 25 ug via INTRAVENOUS

## 2014-03-14 MED ORDER — FENTANYL CITRATE 0.05 MG/ML IJ SOLN
INTRAMUSCULAR | Status: AC
  Filled 2014-03-14: qty 2

## 2014-03-14 MED ORDER — SUCCINYLCHOLINE CHLORIDE 20 MG/ML IJ SOLN
INTRAMUSCULAR | Status: DC | PRN
  Administered 2014-03-14: 120 mg via INTRAVENOUS

## 2014-03-14 MED ORDER — NEOSTIGMINE METHYLSULFATE 10 MG/10ML IV SOLN
INTRAVENOUS | Status: DC | PRN
  Administered 2014-03-14: 3 mg via INTRAVENOUS

## 2014-03-14 MED ORDER — BUPIVACAINE HCL (PF) 0.5 % IJ SOLN
INTRAMUSCULAR | Status: DC | PRN
  Administered 2014-03-14: 4 mL

## 2014-03-14 MED ORDER — NICOTINE 21 MG/24HR TD PT24
21.0000 mg | MEDICATED_PATCH | Freq: Every day | TRANSDERMAL | Status: DC
  Administered 2014-03-14: 21 mg via TRANSDERMAL
  Filled 2014-03-14 (×2): qty 1

## 2014-03-14 MED ORDER — ONDANSETRON HCL 4 MG/2ML IJ SOLN: 4.0000 mg | Freq: Four times a day (QID) | INTRAMUSCULAR | Status: DC | PRN

## 2014-03-14 MED ORDER — MIDAZOLAM HCL 2 MG/2ML IJ SOLN
1.0000 mg | INTRAMUSCULAR | Status: DC | PRN
  Administered 2014-03-14: 2 mg via INTRAVENOUS
  Filled 2014-03-14: qty 2

## 2014-03-14 MED ORDER — MENTHOL 3 MG MT LOZG: 1.0000 | LOZENGE | OROMUCOSAL | Status: DC | PRN

## 2014-03-14 MED ORDER — KETOROLAC TROMETHAMINE 30 MG/ML IJ SOLN
INTRAMUSCULAR | Status: AC
  Filled 2014-03-14: qty 1

## 2014-03-14 MED ORDER — CITALOPRAM HYDROBROMIDE 20 MG PO TABS
40.0000 mg | ORAL_TABLET | Freq: Every day | ORAL | Status: DC
  Administered 2014-03-15: 40 mg via ORAL
  Filled 2014-03-14: qty 2

## 2014-03-14 MED ORDER — BUPIVACAINE HCL (PF) 0.5 % IJ SOLN
INTRAMUSCULAR | Status: AC
  Filled 2014-03-14: qty 30

## 2014-03-14 MED ORDER — SUCCINYLCHOLINE CHLORIDE 20 MG/ML IJ SOLN
INTRAMUSCULAR | Status: AC
  Filled 2014-03-14: qty 1

## 2014-03-14 MED ORDER — ONDANSETRON HCL 4 MG/2ML IJ SOLN: 4.0000 mg | Freq: Once | INTRAMUSCULAR | Status: DC | PRN

## 2014-03-14 MED ORDER — SIMETHICONE 40 MG/0.6ML PO SUSP
ORAL | Status: AC
  Filled 2014-03-14: qty 1.2

## 2014-03-14 MED ORDER — FENTANYL CITRATE 0.05 MG/ML IJ SOLN
INTRAMUSCULAR | Status: DC | PRN
  Administered 2014-03-14: 50 ug via INTRAVENOUS
  Administered 2014-03-14: 25 ug via INTRAVENOUS
  Administered 2014-03-14 (×3): 50 ug via INTRAVENOUS
  Administered 2014-03-14: 25 ug via INTRAVENOUS

## 2014-03-14 MED ORDER — PROPOFOL 10 MG/ML IV EMUL
INTRAVENOUS | Status: AC
  Filled 2014-03-14: qty 20

## 2014-03-14 MED ORDER — ACETAMINOPHEN 325 MG PO TABS: 650.0000 mg | ORAL_TABLET | Freq: Four times a day (QID) | ORAL | Status: DC | PRN

## 2014-03-14 MED ORDER — CYCLOBENZAPRINE HCL 10 MG PO TABS: 10.0000 mg | ORAL_TABLET | Freq: Three times a day (TID) | ORAL | Status: DC | PRN

## 2014-03-14 MED ORDER — SODIUM CHLORIDE 0.9 % IR SOLN
Status: DC | PRN
  Administered 2014-03-14: 1000 mL

## 2014-03-14 SURGICAL SUPPLY — 59 items
APPLIER CLIP 11 MED OPEN (CLIP)
APPLIER CLIP 9.375 SM OPEN (CLIP)
ATTRACTOMAT 16X20 MAGNETIC DRP (DRAPES) ×3 IMPLANT
BAG HAMPER (MISCELLANEOUS) ×3 IMPLANT
BLADE SURG 15 STRL LF DISP TIS (BLADE) ×1 IMPLANT
BLADE SURG 15 STRL SS (BLADE) ×2
BLADE SURG SZ10 CARB STEEL (BLADE) ×3 IMPLANT
CHLORAPREP W/TINT 10.5 ML (MISCELLANEOUS) ×3 IMPLANT
CLIP APPLIE 11 MED OPEN (CLIP) IMPLANT
CLIP APPLIE 9.375 SM OPEN (CLIP) IMPLANT
CLOTH BEACON ORANGE TIMEOUT ST (SAFETY) ×3 IMPLANT
COVER LIGHT HANDLE STERIS (MISCELLANEOUS) ×6 IMPLANT
DERMABOND ADVANCED (GAUZE/BANDAGES/DRESSINGS)
DERMABOND ADVANCED .7 DNX12 (GAUZE/BANDAGES/DRESSINGS) IMPLANT
DRAPE PED LAPAROTOMY (DRAPES) ×3 IMPLANT
DRAPE PROXIMA HALF (DRAPES) ×3 IMPLANT
ELECT NEEDLE TIP 2.8 STRL (NEEDLE) ×3 IMPLANT
ELECT REM PT RETURN 9FT ADLT (ELECTROSURGICAL) ×3
ELECTRODE REM PT RTRN 9FT ADLT (ELECTROSURGICAL) ×1 IMPLANT
FORMALIN 10 PREFIL 120ML (MISCELLANEOUS) ×6 IMPLANT
GAUZE SPONGE 4X4 16PLY XRAY LF (GAUZE/BANDAGES/DRESSINGS) ×6 IMPLANT
GLOVE BIO SURGEON STRL SZ 6.5 (GLOVE) ×2 IMPLANT
GLOVE BIO SURGEONS STRL SZ 6.5 (GLOVE) ×1
GLOVE BIOGEL PI IND STRL 7.0 (GLOVE) ×2 IMPLANT
GLOVE BIOGEL PI INDICATOR 7.0 (GLOVE) ×4
GLOVE ECLIPSE 6.5 STRL STRAW (GLOVE) ×3 IMPLANT
GLOVE EXAM NITRILE PF MED BLUE (GLOVE) ×3 IMPLANT
GLOVE SURG SS PI 7.5 STRL IVOR (GLOVE) ×6 IMPLANT
GOWN STRL REUS W/ TWL LRG LVL3 (GOWN DISPOSABLE) ×2 IMPLANT
GOWN STRL REUS W/TWL LRG LVL3 (GOWN DISPOSABLE) ×7 IMPLANT
HEMOSTAT SURGICEL 2X3 (HEMOSTASIS) ×3 IMPLANT
KIT BLADEGUARD II DBL (SET/KITS/TRAYS/PACK) ×3 IMPLANT
KIT ROOM TURNOVER APOR (KITS) ×3 IMPLANT
LIQUID BAND (GAUZE/BANDAGES/DRESSINGS) ×3 IMPLANT
MANIFOLD NEPTUNE II (INSTRUMENTS) ×3 IMPLANT
MARKER SKIN DUAL TIP RULER LAB (MISCELLANEOUS) ×3 IMPLANT
NEEDLE HYPO 25X1 1.5 SAFETY (NEEDLE) ×3 IMPLANT
NS IRRIG 1000ML POUR BTL (IV SOLUTION) ×3 IMPLANT
PACK BASIC III (CUSTOM PROCEDURE TRAY) ×2
PACK SRG BSC III STRL LF ECLPS (CUSTOM PROCEDURE TRAY) ×1 IMPLANT
PAD ARMBOARD 7.5X6 YLW CONV (MISCELLANEOUS) ×3 IMPLANT
PENCIL HANDSWITCHING (ELECTRODE) ×3 IMPLANT
SET BASIN LINEN APH (SET/KITS/TRAYS/PACK) ×3 IMPLANT
SHEARS HARMONIC 9CM CVD (BLADE) ×3 IMPLANT
SPONGE INTESTINAL PEANUT (DISPOSABLE) ×6 IMPLANT
SUT ETHILON 3 0 FSL (SUTURE) IMPLANT
SUT ETHILON 4 0 PS 2 18 (SUTURE) ×3 IMPLANT
SUT SILK 2 0 (SUTURE) ×2
SUT SILK 2-0 18XBRD TIE 12 (SUTURE) ×1 IMPLANT
SUT SILK 3 0 (SUTURE)
SUT SILK 3-0 18XBRD TIE 12 (SUTURE) IMPLANT
SUT VIC AB 2-0 CT2 27 (SUTURE) ×3 IMPLANT
SUT VIC AB 3-0 SH 27 (SUTURE) ×2
SUT VIC AB 3-0 SH 27X BRD (SUTURE) ×1 IMPLANT
SUT VIC AB 4-0 PS2 27 (SUTURE) ×3 IMPLANT
SYRINGE 10CC LL (SYRINGE) ×3 IMPLANT
SYSTEM CHEST DRAIN TLS 7FR (DRAIN) ×3 IMPLANT
TOWEL OR 17X26 4PK STRL BLUE (TOWEL DISPOSABLE) IMPLANT
YANKAUER SUCT BULB TIP 10FT TU (MISCELLANEOUS) ×3 IMPLANT

## 2014-03-14 NOTE — Interval H&P Note (Signed)
History and Physical Interval Note:  03/14/2014 7:19 AM  [name]  has presented today for surgery, with the diagnosis of thyroid neoplasm unspecified  The various methods of treatment have been discussed with the patient and family. After consideration of risks, benefits and other options for treatment, the patient has consented to  Procedure(s): TOTAL THYROIDECTOMY (Bilateral) as a surgical intervention .  The patient's history has been reviewed, patient examined, no change in status, stable for surgery.  I have reviewed the patient's chart and labs.  Questions were answered to the patient's satisfaction.     Aviva Signs A

## 2014-03-14 NOTE — Plan of Care (Signed)
Problem: Phase I Progression Outcomes Goal: OOB as tolerated unless otherwise ordered Outcome: Completed/Met Date Met:  03/14/14 OOB up to restroom this shift

## 2014-03-14 NOTE — Transfer of Care (Signed)
Immediate Anesthesia Transfer of Care Note  Patient: [name]  Procedure(s) Performed: Procedure(s): TOTAL THYROIDECTOMY (N/A)  Patient Location: PACU  Anesthesia Type:General  Level of Consciousness: awake and alert   Airway & Oxygen Therapy: Patient Spontanous Breathing and Patient connected to face mask oxygen  Post-op Assessment: Report given to PACU RN  Post vital signs: Reviewed and stable  Complications: No apparent anesthesia complications

## 2014-03-14 NOTE — Anesthesia Procedure Notes (Signed)
Procedure Name: Intubation Date/Time: 03/14/2014 7:47 AM Performed by: Tressie Stalker E Pre-anesthesia Checklist: Patient identified, Patient being monitored, Timeout performed, Emergency Drugs available and Suction available Patient Re-evaluated:Patient Re-evaluated prior to inductionOxygen Delivery Method: Circle System Utilized Preoxygenation: Pre-oxygenation with 100% oxygen Intubation Type: IV induction Ventilation: Mask ventilation without difficulty Laryngoscope Size: Mac and 3 Grade View: Grade II Tube type: Oral Tube size: 7.0 mm Number of attempts: 1 Airway Equipment and Method: stylet Placement Confirmation: ETT inserted through vocal cords under direct vision,  positive ETCO2 and breath sounds checked- equal and bilateral Secured at: 21 cm Tube secured with: Tape Dental Injury: Teeth and Oropharynx as per pre-operative assessment

## 2014-03-14 NOTE — Op Note (Signed)
Patient:  [name]  DOB:  Jun 16, 1970  MRN:  [id number]   Preop Diagnosis:  Follicular neoplasm  Postop Diagnosis:  Same  Procedure:  Total thyroidectomy  Surgeon:  Aviva Signs, M.D.  Anes:  Gen. endotracheal  Indications:  Patient is a 43 year old white female who presents with a follicular neoplasm of the thyroid gland. The patient now is to undergo a total thyroidectomy. The risks and benefits of the procedure including bleeding, infection, the possibly of nerve injury, the possibility of voice changes were fully explained to the patient, who gave informed consent.  Procedure note:  The patient was placed the supine position. After induction of general endotracheal anesthesia, the neck was prepped and draped using usual sterile technique with ChloraPrep. Surgical site confirmation was performed.  A transverse incision was made just above the jugular notch. The platysma was divided without difficulty. A superior flap was formed up to the cricoid cartilage and the inferior flap formed down to the jugular notch. The strap muscles were then divided longitudinally and spread laterally. We first turned our attention to the left lobe of the thyroid gland. It was not significantly enlarged. The middle thyroidal vein, inferior thyroidal artery and vein, and superior renal artery as well as the suspensory ligament of Gwenlyn Found were all divided using the harmonic scalpel. The left lobe of the gland along with the isthmus was then freed away from the trachea. This was excised and sent to pathology for further examination. Care was taken to avoid the parathyroid glands. The recurrent laryngeal nerve was kept from the operative field. The right lobe was noted be enlarged with a nodule noted laterally. Again, the suspensory ligament of Berry, middle thyroidal vein, and inferior thyroidal artery and vein were all divided using the Harmonic scar. Care was taken to avoid the parathyroid glands. The right  lobe was removed and sent to pathology further examination. The thyroid beds were irrigated normal saline. No abnormal bleeding was noted. Surgicel was placed in both thyroid beds. A #5 round Jackson-Pratt drain was then placed into the thyroid beds and brought through separate stab wound to the right of the incision. It was secured at the skin level using a 4-0 nylon interrupted suture. The strap muscle was reapproximated using a 2-0 Vicryl running suture. The platysma was reapproximated using a 3-0 Vicryl running suture. The skin was closed using a 4-0 Vicryl subcuticular suture. 0.5% Sensorcaine was instilled the surrounding wound. Liquid barium was applied.  All tape and needle counts were correct at the end of the procedure. The patient was extubated in the operating room and transferred to PACU in stable condition.  She was able to phonate E without difficulty.  Complications:  None  EBL:  Minimal  Specimen:  Left lobe and isthmus, right lobe thyroid gland  Drains: #5 round drain to thyroid beds

## 2014-03-14 NOTE — Anesthesia Postprocedure Evaluation (Signed)
  Anesthesia Post-op Note  Patient: [name]  Procedure(s) Performed: Procedure(s): TOTAL THYROIDECTOMY (N/A)  Patient Location: Nursing Unit  Anesthesia Type:General  Level of Consciousness: awake  Airway and Oxygen Therapy: Patient Spontanous Breathing  Post-op Pain: none  Post-op Assessment: Post-op Vital signs reviewed, Patient's Cardiovascular Status Stable, Respiratory Function Stable, Patent Airway and No signs of Nausea or vomiting  Post-op Vital Signs: Reviewed and stable  Last Vitals:  Filed Vitals:   03/14/14 1000  BP: 110/59  Pulse: 70  Temp:   Resp: 11    Complications: No apparent anesthesia complications

## 2014-03-14 NOTE — Anesthesia Preprocedure Evaluation (Signed)
Anesthesia Evaluation  Patient identified by MRN, date of birth, ID band Patient awake    Reviewed: Allergy & Precautions, H&P , NPO status , Patient's Chart, lab work & pertinent test results  Airway Mallampati: I  TM Distance: >3 FB Neck ROM: Limited   Comment: Limited extension ( 50%) from two cervical fusions Dental  (+) Teeth Intact, Implants, Partial Upper   Pulmonary neg pulmonary ROS, Current Smoker,  breath sounds clear to auscultation        Cardiovascular negative cardio ROS  Rhythm:Regular Rate:Normal     Neuro/Psych  Headaches, PSYCHIATRIC DISORDERS Anxiety Chronic lumbar and cervical pain, multiple fusions.  Neuromuscular disease    GI/Hepatic negative GI ROS, GERD-  Medicated and Controlled,  Endo/Other    Renal/GU      Musculoskeletal   Abdominal   Peds  Hematology   Anesthesia Other Findings   Reproductive/Obstetrics                             Anesthesia Physical Anesthesia Plan  ASA: III  Anesthesia Plan: General   Post-op Pain Management:    Induction: Intravenous, Rapid sequence and Cricoid pressure planned  Airway Management Planned: Oral ETT  Additional Equipment:   Intra-op Plan:   Post-operative Plan: Extubation in OR  Informed Consent: I have reviewed the patients History and Physical, chart, labs and discussed the procedure including the risks, benefits and alternatives for the proposed anesthesia with the patient or authorized representative who has indicated his/her understanding and acceptance.     Plan Discussed with: CRNA and Surgeon  Anesthesia Plan Comments: (Position neck/head for surgery while awake.)        Anesthesia Quick Evaluation

## 2014-03-14 NOTE — Progress Notes (Signed)
30 Pt requesting nicotine patch, MD notified & new order given for Nicotine CQ Patch 21mg  Q24H. Pt aware.

## 2014-03-15 DIAGNOSIS — E041 Nontoxic single thyroid nodule: Secondary | ICD-10-CM | POA: Diagnosis not present

## 2014-03-15 LAB — COMPREHENSIVE METABOLIC PANEL
ALT: 9 U/L (ref 0–35)
ANION GAP: 10 (ref 5–15)
AST: 11 U/L (ref 0–37)
Albumin: 2.9 g/dL — ABNORMAL LOW (ref 3.5–5.2)
Alkaline Phosphatase: 114 U/L (ref 39–117)
BUN: 15 mg/dL (ref 6–23)
CO2: 26 meq/L (ref 19–32)
CREATININE: 0.92 mg/dL (ref 0.50–1.10)
Calcium: 7.9 mg/dL — ABNORMAL LOW (ref 8.4–10.5)
Chloride: 108 mEq/L (ref 96–112)
GFR, EST AFRICAN AMERICAN: 88 mL/min — AB (ref >90)
GFR, EST NON AFRICAN AMERICAN: 76 mL/min — AB (ref >90)
GLUCOSE: 126 mg/dL — AB (ref 70–99)
Potassium: 3.6 mEq/L — ABNORMAL LOW (ref 3.7–5.3)
Sodium: 144 mEq/L (ref 137–147)
Total Bilirubin: <0.2 mg/dL — ABNORMAL LOW (ref 0.3–1.2)
Total Protein: 5.7 g/dL — ABNORMAL LOW (ref 6.0–8.3)

## 2014-03-15 LAB — CBC
HCT: 31.3 % — ABNORMAL LOW (ref 36.0–46.0)
Hemoglobin: 10.7 g/dL — ABNORMAL LOW (ref 12.0–15.0)
MCH: 31.3 pg (ref 26.0–34.0)
MCHC: 34.2 g/dL (ref 30.0–36.0)
MCV: 91.5 fL (ref 78.0–100.0)
PLATELETS: 196 10*3/uL (ref 150–400)
RBC: 3.42 MIL/uL — AB (ref 3.87–5.11)
RDW: 12.4 % (ref 11.5–15.5)
WBC: 11.9 10*3/uL — AB (ref 4.0–10.5)

## 2014-03-15 MED ORDER — LEVOTHYROXINE SODIUM 125 MCG PO TABS: 125.0000 ug | ORAL_TABLET | Freq: Every day | ORAL | Status: DC

## 2014-03-15 MED ORDER — INFLUENZA VAC SPLIT QUAD 0.5 ML IM SUSY
0.5000 mL | PREFILLED_SYRINGE | Freq: Once | INTRAMUSCULAR | Status: AC
  Administered 2014-03-15: 0.5 mL via INTRAMUSCULAR
  Filled 2014-03-15: qty 0.5

## 2014-03-15 MED ORDER — INFLUENZA VAC SPLIT QUAD 0.5 ML IM SUSY: 0.5000 mL | PREFILLED_SYRINGE | INTRAMUSCULAR | Status: DC

## 2014-03-15 MED ORDER — HYDROMORPHONE HCL 2 MG PO TABS: 2.0000 mg | ORAL_TABLET | ORAL | Status: AC | PRN

## 2014-03-15 MED ORDER — CALCIUM CARBONATE-VITAMIN D 500-200 MG-UNIT PO TABS: 1.0000 | ORAL_TABLET | Freq: Two times a day (BID) | ORAL | Status: DC

## 2014-03-15 NOTE — Progress Notes (Signed)
[name] discharged home with boyfriend per MD order.  Discharge instructions reviewed and discussed with the patient, all questions and concerns answered. Copy of instructions and scripts given to patient.    Medication List    STOP taking these medications        oxyCODONE 15 MG immediate release tablet  Commonly known as:  ROXICODONE     oxycodone 30 MG immediate release tablet  Commonly known as:  ROXICODONE      TAKE these medications        alprazolam 2 MG tablet  Commonly known as:  XANAX  Take 1 tablet (2 mg total) by mouth 4 (four) times daily as needed for anxiety.     calcium-vitamin D 500-200 MG-UNIT per tablet  Commonly known as:  OSCAL WITH D  Take 1 tablet by mouth 2 (two) times daily.     citalopram 40 MG tablet  Commonly known as:  CELEXA  Take 40 mg by mouth daily.     cyclobenzaprine 10 MG tablet  Commonly known as:  FLEXERIL  Take 10 mg by mouth 3 (three) times daily as needed for muscle spasms.     diazepam 10 MG tablet  Commonly known as:  VALIUM  Take 10 mg by mouth 4 (four) times daily.     FISH OIL PO  Take 1,000 mg by mouth 2 (two) times daily.     HYDROmorphone 2 MG tablet  Commonly known as:  DILAUDID  Take 1 tablet (2 mg total) by mouth every 4 (four) hours as needed for severe pain.     levothyroxine 125 MCG tablet  Commonly known as:  SYNTHROID  Take 1 tablet (125 mcg total) by mouth daily before breakfast.     NEXIUM 40 MG capsule  Generic drug:  esomeprazole  Take 40 mg by mouth daily.     nitrofurantoin (macrocrystal-monohydrate) 100 MG capsule  Commonly known as:  MACROBID  Take 1 capsule (100 mg total) by mouth 2 (two) times daily. X 7 days     OVER THE COUNTER MEDICATION  Take 1 tablet by mouth 2 (two) times daily. OTC sinus medication     promethazine 25 MG tablet  Commonly known as:  PHENERGAN  Take 1 tablet (25 mg total) by mouth every 6 (six) hours as needed.     rizatriptan 10 MG disintegrating tablet   Commonly known as:  MAXALT-MLT  Take 1 tablet by mouth as needed. For migraine     topiramate 25 MG tablet  Commonly known as:  TOPAMAX  Take 25 mg by mouth 2 (two) times daily.     traZODone 50 MG tablet  Commonly known as:  DESYREL  Take 50-100 mg by mouth at bedtime as needed for sleep.        Patients skin is clean, dry and intact. Surgical incision was clean, dry, and intact with no signs of infection. IV site discontinued and catheter remains intact. Site without signs and symptoms of complications. Dressing and pressure applied.  Patient escorted to car,  no distress noted upon discharge.  Regino Bellow 03/15/2014 11:27 AM

## 2014-03-15 NOTE — Addendum Note (Signed)
Addendum  created 03/15/14 1024 by Ollen Bowl, CRNA   Modules edited: Notes Section   Notes Section:  File: [id number]

## 2014-03-15 NOTE — Anesthesia Postprocedure Evaluation (Signed)
  Anesthesia Post-op Note  Patient: [name]  Procedure(s) Performed: Procedure(s): TOTAL THYROIDECTOMY (N/A)  Patient Location: Nursing Unit  Anesthesia Type:General  Level of Consciousness: awake, alert  and oriented  Airway and Oxygen Therapy: Patient Spontanous Breathing  Post-op Pain: none  Post-op Assessment: Post-op Vital signs reviewed, Patient's Cardiovascular Status Stable, Respiratory Function Stable, Patent Airway, No signs of Nausea or vomiting and Adequate PO intake  Post-op Vital Signs: Reviewed and stable  Last Vitals:  Filed Vitals:   03/15/14 0548  BP: 104/57  Pulse: 61  Temp: 36.8 C  Resp: 18    Complications: No apparent anesthesia complications

## 2014-03-15 NOTE — Discharge Summary (Signed)
Physician Discharge Summary  Patient ID: TRACINA BEAUMONT MRN: [id number] DOB/AGE: [birth year] 43 y.o.  Admit date: 03/14/2014 Discharge date: 03/15/2014  Admission Diagnoses: Follicular neoplasm of thyroid  Discharge Diagnoses: Same Active Problems:   Follicular neoplasm of thyroid   Discharged Condition: good  Hospital Course: Patient is a 43 year old white female with a follicular neoplasm of the thyroid who presented for total thyroidectomy. She tolerated the procedure well. Her postoperative course was unremarkable. Her calcium levels were noted to decrease, but this was in line with her decrease in albumin. She does have a scratchy throat, but is able to phonate without difficulty. Final pathology still pending. She is being discharged home on postoperative day one in good improving condition. She has been instructed to only take the dilated I prescribed her, and not to take her other pain pills to have been prescribed to her by another physician.  Treatments: surgery: Total thyroidectomy on 03/14/2014  Discharge Exam: Blood pressure 104/57, pulse 61, temperature 98.3 F (36.8 C), temperature source Oral, resp. rate 18, height 5\' 10"  (1.778 m), weight 80.74 kg (178 lb), SpO2 93 %. General appearance: alert, cooperative and no distress Neck: Incision healing well, minimal swelling. Drain removed. Resp: clear to auscultation bilaterally Cardio: regular rate and rhythm, S1, S2 normal, no murmur, click, rub or gallop  Disposition: 01-Home or Self Care     Medication List    STOP taking these medications        oxyCODONE 15 MG immediate release tablet  Commonly known as:  ROXICODONE     oxycodone 30 MG immediate release tablet  Commonly known as:  ROXICODONE      TAKE these medications        alprazolam 2 MG tablet  Commonly known as:  XANAX  Take 1 tablet (2 mg total) by mouth 4 (four) times daily as needed for anxiety.     calcium-vitamin D 500-200 MG-UNIT per  tablet  Commonly known as:  OSCAL WITH D  Take 1 tablet by mouth 2 (two) times daily.     citalopram 40 MG tablet  Commonly known as:  CELEXA  Take 40 mg by mouth daily.     cyclobenzaprine 10 MG tablet  Commonly known as:  FLEXERIL  Take 10 mg by mouth 3 (three) times daily as needed for muscle spasms.     diazepam 10 MG tablet  Commonly known as:  VALIUM  Take 10 mg by mouth 4 (four) times daily.     FISH OIL PO  Take 1,000 mg by mouth 2 (two) times daily.     HYDROmorphone 2 MG tablet  Commonly known as:  DILAUDID  Take 1 tablet (2 mg total) by mouth every 4 (four) hours as needed for severe pain.     levothyroxine 125 MCG tablet  Commonly known as:  SYNTHROID  Take 1 tablet (125 mcg total) by mouth daily before breakfast.     NEXIUM 40 MG capsule  Generic drug:  esomeprazole  Take 40 mg by mouth daily.     nitrofurantoin (macrocrystal-monohydrate) 100 MG capsule  Commonly known as:  MACROBID  Take 1 capsule (100 mg total) by mouth 2 (two) times daily. X 7 days     OVER THE COUNTER MEDICATION  Take 1 tablet by mouth 2 (two) times daily. OTC sinus medication     promethazine 25 MG tablet  Commonly known as:  PHENERGAN  Take 1 tablet (25 mg total) by mouth every 6 (six) hours as  needed.     rizatriptan 10 MG disintegrating tablet  Commonly known as:  MAXALT-MLT  Take 1 tablet by mouth as needed. For migraine     topiramate 25 MG tablet  Commonly known as:  TOPAMAX  Take 25 mg by mouth 2 (two) times daily.     traZODone 50 MG tablet  Commonly known as:  DESYREL  Take 50-100 mg by mouth at bedtime as needed for sleep.           Follow-up Information    Follow up with Jamesetta So, MD. Schedule an appointment as soon as possible for a visit on 03/22/2014.   Specialty:  General Surgery   Contact information:   [address] [address] [phone]       Signed: Aviva Signs A 03/15/2014, 8:21 AM

## 2014-03-15 NOTE — Discharge Instructions (Signed)
Thyroidectomy °Care After °Refer to this sheet in the next few weeks. These instructions provide you with general information on caring for yourself after you leave the hospital. Your caregiver also may give you specific instructions. Your treatment has been planned according to the most current medical practices available, but problems sometimes occur. Call your caregiver if you have any problems or questions after your procedure. °HOME CARE INSTRUCTIONS  °· It is normal to be sore for a few weeks following surgery. See your caregiver if your pain seems to be getting worse rather than better. °· Only take over-the-counter or prescription medicines for pain, discomfort, or fever as directed by your caregiver. Avoid taking medicines that contain aspirin and ibuprofen because they increase the risk of bleeding. °· Shower rather than bathe until instructed otherwise by your caregiver. °· Change your bandages (dressings) as directed by your caregiver. °· You may resume a normal diet and activities as directed by your caregiver. °· Avoid lifting weight greater than 20 lb (9 kg) or participating in heavy exercise or contact sports for 10 days or as instructed by your caregiver. °· Make an appointment to see your caregiver for stitch (suture) or staple removal. °SEEK MEDICAL CARE IF:  °· You have increased bleeding from your wound. °· You have redness, swelling, or increasing pain from your wound or in your neck. °· There is pus coming from your wound. °· You have an oral temperature above 102° F (38.9° C). °· There is a bad smell coming from the wound or dressing. °· You develop lightheadedness or feel faint. °· You develop numbness, tingling, or muscle spasms in your arms, hands, feet, or face. °· You have difficulty swallowing. °SEEK IMMEDIATE MEDICAL CARE IF:  °· You develop a rash. °· You have difficulty breathing. °· You hear whistling noises that come from your chest. °· You develop a cough that becomes increasingly  worse. °· You develop any reaction or side effects to medicines given. °· There is swelling in your neck. °· You develop changes in speech or hoarseness, which is getting worse. °MAKE SURE YOU:  °· Understand these instructions. °· Will watch your condition. °· Will get help right away if you are not doing well or get worse. °Document Released: 10/05/2004 Document Revised: 06/10/2011 Document Reviewed: 05/25/2010 °[employer]® Patient Information ©2015 [employer]. This information is not intended to replace advice given to you by your health care provider. Make sure you discuss any questions you have with your health care provider. ° °

## 2014-03-16 ENCOUNTER — Encounter (HOSPITAL_COMMUNITY): Payer: Self-pay | Admitting: General Surgery

## 2014-03-30 DIAGNOSIS — G894 Chronic pain syndrome: Secondary | ICD-10-CM | POA: Diagnosis not present

## 2014-03-30 DIAGNOSIS — Z6824 Body mass index (BMI) 24.0-24.9, adult: Secondary | ICD-10-CM | POA: Diagnosis not present

## 2014-04-29 DIAGNOSIS — I10 Essential (primary) hypertension: Secondary | ICD-10-CM | POA: Diagnosis not present

## 2014-04-29 DIAGNOSIS — F419 Anxiety disorder, unspecified: Secondary | ICD-10-CM | POA: Diagnosis not present

## 2014-04-29 DIAGNOSIS — Z6824 Body mass index (BMI) 24.0-24.9, adult: Secondary | ICD-10-CM | POA: Diagnosis not present

## 2014-04-29 DIAGNOSIS — G894 Chronic pain syndrome: Secondary | ICD-10-CM | POA: Diagnosis not present

## 2014-04-29 DIAGNOSIS — M7712 Lateral epicondylitis, left elbow: Secondary | ICD-10-CM | POA: Diagnosis not present

## 2014-05-30 DIAGNOSIS — Z6822 Body mass index (BMI) 22.0-22.9, adult: Secondary | ICD-10-CM | POA: Diagnosis not present

## 2014-05-30 DIAGNOSIS — G894 Chronic pain syndrome: Secondary | ICD-10-CM | POA: Diagnosis not present

## 2014-06-03 DIAGNOSIS — M545 Low back pain: Secondary | ICD-10-CM | POA: Diagnosis not present

## 2014-06-03 DIAGNOSIS — M792 Neuralgia and neuritis, unspecified: Secondary | ICD-10-CM | POA: Diagnosis not present

## 2014-06-03 DIAGNOSIS — M5030 Other cervical disc degeneration, unspecified cervical region: Secondary | ICD-10-CM | POA: Diagnosis not present

## 2014-06-03 DIAGNOSIS — M47816 Spondylosis without myelopathy or radiculopathy, lumbar region: Secondary | ICD-10-CM | POA: Diagnosis not present

## 2014-06-22 DIAGNOSIS — M47816 Spondylosis without myelopathy or radiculopathy, lumbar region: Secondary | ICD-10-CM | POA: Diagnosis not present

## 2014-06-22 DIAGNOSIS — Z981 Arthrodesis status: Secondary | ICD-10-CM | POA: Diagnosis not present

## 2014-06-24 DIAGNOSIS — E063 Autoimmune thyroiditis: Secondary | ICD-10-CM | POA: Diagnosis not present

## 2014-06-24 DIAGNOSIS — F419 Anxiety disorder, unspecified: Secondary | ICD-10-CM | POA: Diagnosis not present

## 2014-06-24 DIAGNOSIS — G894 Chronic pain syndrome: Secondary | ICD-10-CM | POA: Diagnosis not present

## 2014-06-24 DIAGNOSIS — Z6823 Body mass index (BMI) 23.0-23.9, adult: Secondary | ICD-10-CM | POA: Diagnosis not present

## 2014-06-24 DIAGNOSIS — R251 Tremor, unspecified: Secondary | ICD-10-CM | POA: Diagnosis not present

## 2014-07-21 DIAGNOSIS — R209 Unspecified disturbances of skin sensation: Secondary | ICD-10-CM | POA: Diagnosis not present

## 2014-07-21 DIAGNOSIS — M5412 Radiculopathy, cervical region: Secondary | ICD-10-CM | POA: Diagnosis not present

## 2014-07-21 DIAGNOSIS — M5000 Cervical disc disorder with myelopathy, unspecified cervical region: Secondary | ICD-10-CM | POA: Diagnosis not present

## 2014-07-21 DIAGNOSIS — R251 Tremor, unspecified: Secondary | ICD-10-CM | POA: Diagnosis not present

## 2014-07-27 DIAGNOSIS — M5412 Radiculopathy, cervical region: Secondary | ICD-10-CM | POA: Diagnosis not present

## 2014-07-27 DIAGNOSIS — R209 Unspecified disturbances of skin sensation: Secondary | ICD-10-CM | POA: Diagnosis not present

## 2014-07-29 DIAGNOSIS — R569 Unspecified convulsions: Secondary | ICD-10-CM | POA: Diagnosis not present

## 2014-08-09 DIAGNOSIS — H6090 Unspecified otitis externa, unspecified ear: Secondary | ICD-10-CM | POA: Diagnosis not present

## 2014-08-09 DIAGNOSIS — G894 Chronic pain syndrome: Secondary | ICD-10-CM | POA: Diagnosis not present

## 2014-08-09 DIAGNOSIS — N390 Urinary tract infection, site not specified: Secondary | ICD-10-CM | POA: Diagnosis not present

## 2014-08-09 DIAGNOSIS — Z6823 Body mass index (BMI) 23.0-23.9, adult: Secondary | ICD-10-CM | POA: Diagnosis not present

## 2014-09-12 DIAGNOSIS — G894 Chronic pain syndrome: Secondary | ICD-10-CM | POA: Diagnosis not present

## 2014-09-12 DIAGNOSIS — E063 Autoimmune thyroiditis: Secondary | ICD-10-CM | POA: Diagnosis not present

## 2014-09-12 DIAGNOSIS — M2212 Recurrent subluxation of patella, left knee: Secondary | ICD-10-CM | POA: Diagnosis not present

## 2014-09-12 DIAGNOSIS — Z6823 Body mass index (BMI) 23.0-23.9, adult: Secondary | ICD-10-CM | POA: Diagnosis not present

## 2014-09-12 DIAGNOSIS — L309 Dermatitis, unspecified: Secondary | ICD-10-CM | POA: Diagnosis not present

## 2014-09-15 DIAGNOSIS — Z79899 Other long term (current) drug therapy: Secondary | ICD-10-CM | POA: Diagnosis not present

## 2014-09-15 DIAGNOSIS — F17200 Nicotine dependence, unspecified, uncomplicated: Secondary | ICD-10-CM | POA: Diagnosis not present

## 2014-09-15 DIAGNOSIS — S0502XA Injury of conjunctiva and corneal abrasion without foreign body, left eye, initial encounter: Secondary | ICD-10-CM | POA: Diagnosis not present

## 2014-10-12 DIAGNOSIS — M47816 Spondylosis without myelopathy or radiculopathy, lumbar region: Secondary | ICD-10-CM | POA: Diagnosis not present

## 2014-10-12 DIAGNOSIS — M5136 Other intervertebral disc degeneration, lumbar region: Secondary | ICD-10-CM | POA: Diagnosis not present

## 2014-10-12 DIAGNOSIS — M545 Low back pain: Secondary | ICD-10-CM | POA: Diagnosis not present

## 2014-10-12 DIAGNOSIS — M47812 Spondylosis without myelopathy or radiculopathy, cervical region: Secondary | ICD-10-CM | POA: Diagnosis not present

## 2014-10-19 DIAGNOSIS — F17200 Nicotine dependence, unspecified, uncomplicated: Secondary | ICD-10-CM | POA: Diagnosis not present

## 2014-10-19 DIAGNOSIS — M25562 Pain in left knee: Secondary | ICD-10-CM | POA: Diagnosis not present

## 2014-10-19 DIAGNOSIS — S8002XA Contusion of left knee, initial encounter: Secondary | ICD-10-CM | POA: Diagnosis not present

## 2014-10-19 DIAGNOSIS — W1839XA Other fall on same level, initial encounter: Secondary | ICD-10-CM | POA: Diagnosis not present

## 2014-10-19 DIAGNOSIS — Z79899 Other long term (current) drug therapy: Secondary | ICD-10-CM | POA: Diagnosis not present

## 2014-10-21 ENCOUNTER — Emergency Department (HOSPITAL_COMMUNITY)
Admission: EM | Admit: 2014-10-21 | Discharge: 2014-10-22 | Disposition: A | Discharge status: Home / Self Care | Payer: Medicare Other | Attending: Emergency Medicine | Admitting: Emergency Medicine

## 2014-10-21 ENCOUNTER — Encounter (HOSPITAL_COMMUNITY): Payer: Self-pay | Admitting: *Deleted

## 2014-10-21 ENCOUNTER — Emergency Department (HOSPITAL_COMMUNITY): Payer: Medicare Other

## 2014-10-21 DIAGNOSIS — Y998 Other external cause status: Secondary | ICD-10-CM | POA: Insufficient documentation

## 2014-10-21 DIAGNOSIS — E079 Disorder of thyroid, unspecified: Secondary | ICD-10-CM | POA: Diagnosis not present

## 2014-10-21 DIAGNOSIS — Y9389 Activity, other specified: Secondary | ICD-10-CM | POA: Insufficient documentation

## 2014-10-21 DIAGNOSIS — F419 Anxiety disorder, unspecified: Secondary | ICD-10-CM | POA: Insufficient documentation

## 2014-10-21 DIAGNOSIS — G8929 Other chronic pain: Secondary | ICD-10-CM | POA: Diagnosis not present

## 2014-10-21 DIAGNOSIS — S8392XA Sprain of unspecified site of left knee, initial encounter: Secondary | ICD-10-CM | POA: Diagnosis not present

## 2014-10-21 DIAGNOSIS — Y92009 Unspecified place in unspecified non-institutional (private) residence as the place of occurrence of the external cause: Secondary | ICD-10-CM | POA: Diagnosis not present

## 2014-10-21 DIAGNOSIS — Z720 Tobacco use: Secondary | ICD-10-CM | POA: Insufficient documentation

## 2014-10-21 DIAGNOSIS — S93402A Sprain of unspecified ligament of left ankle, initial encounter: Secondary | ICD-10-CM | POA: Diagnosis not present

## 2014-10-21 DIAGNOSIS — S8992XA Unspecified injury of left lower leg, initial encounter: Secondary | ICD-10-CM | POA: Diagnosis present

## 2014-10-21 DIAGNOSIS — Z79899 Other long term (current) drug therapy: Secondary | ICD-10-CM | POA: Insufficient documentation

## 2014-10-21 DIAGNOSIS — W1839XA Other fall on same level, initial encounter: Secondary | ICD-10-CM | POA: Insufficient documentation

## 2014-10-21 DIAGNOSIS — K219 Gastro-esophageal reflux disease without esophagitis: Secondary | ICD-10-CM | POA: Diagnosis not present

## 2014-10-21 DIAGNOSIS — Z792 Long term (current) use of antibiotics: Secondary | ICD-10-CM | POA: Diagnosis not present

## 2014-10-21 HISTORY — DX: Disorder of thyroid, unspecified: E07.9

## 2014-10-21 MED ORDER — KETOROLAC TROMETHAMINE 10 MG PO TABS
10.0000 mg | ORAL_TABLET | Freq: Once | ORAL | Status: AC
  Administered 2014-10-21: 10 mg via ORAL
  Filled 2014-10-21: qty 1

## 2014-10-21 MED ORDER — ACETAMINOPHEN-CODEINE #3 300-30 MG PO TABS
2.0000 | ORAL_TABLET | Freq: Once | ORAL | Status: AC
  Administered 2014-10-21: 2 via ORAL
  Filled 2014-10-21: qty 2

## 2014-10-21 MED ORDER — PROMETHAZINE HCL 12.5 MG PO TABS
12.5000 mg | ORAL_TABLET | Freq: Once | ORAL | Status: AC
  Administered 2014-10-21: 12.5 mg via ORAL
  Filled 2014-10-21: qty 1

## 2014-10-21 NOTE — ED Provider Notes (Signed)
CSN: [id number]     Arrival date & time 10/21/14  2041 History   First MD Initiated Contact with Patient 10/21/14 2201     Chief Complaint  Patient presents with  . Knee Injury     (Consider location/radiation/quality/duration/timing/severity/associated sxs/prior Treatment) HPI Comments: Patient is a 44 year old female who presents to the emergency department with knee and ankle pain on the left side. The patient states that on Wednesday, July 20 her flip-flops became wet, and she twisted her left knee and ankle, and fell. She states that her knee was dislocated, she was able to reposition it partially, and her son was able to position it back in its normal alignment. She was seen by the emergency department at the [facility], x-rays were done, and there is no fracture or dislocation or effusion appreciated. The patient was wrapped with an Ace wrap according to the patient. The patient reports that her left ankle was also hurting her and injured, but was not x-rayed. The patient is very concerned that she is continuing to have pain of the knee, and also concerned that she is having pain of the ankle. The patient request additional evaluation.  The history is provided by the patient.    Past Medical History  Diagnosis Date  . Anxiety   . Reflex sympathetic dystrophy   . Chronic back pain   . Chronic neck pain   . GERD (gastroesophageal reflux disease)   . Headache     migraines- last one 2 months ago  . Hypercholesteremia   . Thyroid disease    Past Surgical History  Procedure Laterality Date  . Neck surgery      plate in neck  . Abdominal hysterectomy      partial  . Back surgery      plates and screws  . Knee arthroscopy Left   . Cyst excision Left     foot  . Salpingoophorectomy Right   . Breast surgery Left     fibrocystic tumor  . Strabismus surgery Bilateral   . Ear cyst excision Right 01/27/2013    Procedure: EXCISION CYST ON SCALP ;  Surgeon: Jamesetta So,  MD;  Location: [facility];  Service: General;  Laterality: Right;  . Ear cyst excision Right 01/27/2013    Procedure: CYST REMOVAL right axilla;  Surgeon: Jamesetta So, MD;  Location: [facility];  Service: General;  Laterality: Right;  . Thyroidectomy N/A 03/14/2014    Procedure: TOTAL THYROIDECTOMY;  Surgeon: Jamesetta So, MD;  Location: [facility];  Service: General;  Laterality: N/A;   History reviewed. No pertinent family history. History  Substance Use Topics  . Smoking status: Current Every Day Smoker -- 1.00 packs/day for 20 years    Types: Cigarettes  . Smokeless tobacco: Never Used  . Alcohol Use: Yes     Comment: occasionally   OB History    No data available     Review of Systems  Constitutional: Negative for activity change.       All ROS Neg except as noted in HPI  HENT: Negative for nosebleeds.   Eyes: Negative for photophobia and discharge.  Respiratory: Negative for cough, shortness of breath and wheezing.   Cardiovascular: Negative for chest pain and palpitations.  Gastrointestinal: Negative for abdominal pain and blood in stool.  Genitourinary: Negative for dysuria, frequency and hematuria.  Musculoskeletal: Positive for arthralgias. Negative for back pain and neck pain.  Skin: Negative.   Neurological: Positive for weakness. Negative for  dizziness, seizures and speech difficulty.  Psychiatric/Behavioral: Negative for hallucinations and confusion. The patient is nervous/anxious.   All other systems reviewed and are negative.     Allergies  Darvocet and Lyrica  Home Medications   Prior to Admission medications   Medication Sig Start Date End Date Taking? Authorizing Provider  alprazolam Duanne Moron) 2 MG tablet Take 1 tablet (2 mg total) by mouth 4 (four) times daily as needed for anxiety. 03/29/13   Carmin Muskrat, MD  calcium-vitamin D (OSCAL WITH D) 500-200 MG-UNIT per tablet Take 1 tablet by mouth 2 (two) times daily. 03/15/14   Aviva Signs, MD  citalopram  (CELEXA) 40 MG tablet Take 40 mg by mouth daily.    Historical Provider, MD  cyclobenzaprine (FLEXERIL) 10 MG tablet Take 10 mg by mouth 3 (three) times daily as needed for muscle spasms.    Historical Provider, MD  diazepam (VALIUM) 10 MG tablet Take 10 mg by mouth 4 (four) times daily. 12/20/13   Historical Provider, MD  HYDROmorphone (DILAUDID) 2 MG tablet Take 1 tablet (2 mg total) by mouth every 4 (four) hours as needed for severe pain. 03/15/14 03/15/15  Aviva Signs, MD  levothyroxine (SYNTHROID) 125 MCG tablet Take 1 tablet (125 mcg total) by mouth daily before breakfast. 03/15/14   Aviva Signs, MD  NEXIUM 40 MG capsule Take 40 mg by mouth daily. 03/11/13   Historical Provider, MD  nitrofurantoin, macrocrystal-monohydrate, (MACROBID) 100 MG capsule Take 1 capsule (100 mg total) by mouth 2 (two) times daily. X 7 days 01/10/14   Nat Christen, MD  Omega-3 Fatty Acids (FISH OIL PO) Take 1,000 mg by mouth 2 (two) times daily.    Historical Provider, MD  OVER THE COUNTER MEDICATION Take 1 tablet by mouth 2 (two) times daily. OTC sinus medication    Historical Provider, MD  promethazine (PHENERGAN) 25 MG tablet Take 1 tablet (25 mg total) by mouth every 6 (six) hours as needed. Patient taking differently: Take 25 mg by mouth every 6 (six) hours as needed for nausea.  01/10/14   Nat Christen, MD  rizatriptan (MAXALT-MLT) 10 MG disintegrating tablet Take 1 tablet by mouth as needed. For migraine 01/05/13   Historical Provider, MD  topiramate (TOPAMAX) 25 MG tablet Take 25 mg by mouth 2 (two) times daily. 03/11/13   Historical Provider, MD  traZODone (DESYREL) 50 MG tablet Take 50-100 mg by mouth at bedtime as needed for sleep.    Historical Provider, MD   BP 102/58 mmHg  Pulse 82  Temp(Src) 99 F (37.2 C)  Resp 20  Ht 5\' 11"  (1.803 m)  Wt 152 lb (68.947 kg)  BMI 21.21 kg/m2  SpO2 100% Physical Exam  Constitutional: She is oriented to person, place, and time. She appears well-developed and  well-nourished.  Non-toxic appearance.  HENT:  Head: Normocephalic.  Right Ear: Tympanic membrane and external ear normal.  Left Ear: Tympanic membrane and external ear normal.  Eyes: EOM and lids are normal. Pupils are equal, round, and reactive to light.  Neck: Normal range of motion. Neck supple. Carotid bruit is not present.  Cardiovascular: Normal rate, regular rhythm, normal heart sounds, intact distal pulses and normal pulses.   Pulmonary/Chest: Breath sounds normal. No respiratory distress.  Abdominal: Soft. Bowel sounds are normal. There is no tenderness. There is no guarding.  Musculoskeletal: Normal range of motion.  There is good range of motion of the left hip and knee. There is soreness with range of motion of the knee,  there is pain and soreness with palpation of the anterior left knee. There is no mass noted of the posterior knee, and no effusion of the joint. There is no deformity of the anterior tibial tuberosity. There is no deformity of the tibia area.  There is soreness to palpation of the lateral malleolus. There is no deformity of the left ankle. The dorsalis pedis pulses 2+ bilaterally. The capillary refill is less than 2 seconds bilaterally. There no puncture wounds or injuries noted to the plantar surface.  Lymphadenopathy:       Head (right side): No submandibular adenopathy present.       Head (left side): No submandibular adenopathy present.    She has no cervical adenopathy.  Neurological: She is alert and oriented to person, place, and time. She has normal strength. No cranial nerve deficit or sensory deficit.  Skin: Skin is warm and dry.  Psychiatric: She has a normal mood and affect. Her speech is normal.  Nursing note and vitals reviewed.   ED Course  Procedures (including critical care time) Labs Review Labs Reviewed - No data to display  Imaging Review No results found.   EKG Interpretation None      MDM  X-ray of the knee another facility  was read as negative. X-ray of the ankle tonight was read as negative. The vital signs were nonacute. No gross neurovascular issues noted. The patient is fitted with an ankle stirrup splint, and a knee immobilizer. Crutches were offered, but the patient refused him because she has crutches in her car. Patient is advised to see Dr. Gerarda Fraction (PCP) for additional evaluation and management of this issue. Questions answered, patient is in agreement with this discharge plan.    Final diagnoses:  None    **I have reviewed nursing notes, vital signs, and all appropriate lab and imaging results for this patient.Lily Kocher, PA-C 10/22/14 0007  Quintella Reichert, MD 10/22/14 [phone]

## 2014-10-21 NOTE — ED Notes (Signed)
Pt states on Wednesday morning she came in the house and her flip flops were wet and she twisted her knee and ankle and fell. Pt went to [facility] and they told her she had a dislocated knee and wrapped her knee in an ace wrap. Pt states that they never xrayed her ankle. Pt states she is still having left knee, ankle, and foot pain.

## 2014-10-22 NOTE — Discharge Instructions (Signed)
Your examination at this time does not suggest any dislocation of your left knee, or effusion of her knee. X-ray of your knee at another facility was negative for fracture or other acute problem. X-ray of your ankle is negative for fracture, or dislocation. You have been fitted with an ankle stirrup splint. Please use the knee immobilizer, and the ankle stirrup splint over the next 7 days. Please use your crutches until you can safely apply weight to the left lower extremity. Please keep your ankle elevated above your waist. Please see Dr.Fusco for additional evaluation and management of this injury. Ankle Sprain An ankle sprain is an injury to the strong, fibrous tissues (ligaments) that hold your ankle bones together.  HOME CARE   Put ice on your ankle for 1-2 days or as told by your doctor.  Put ice in a plastic bag.  Place a towel between your skin and the bag.  Leave the ice on for 15-20 minutes at a time, every 2 hours while you are awake.  Only take medicine as told by your doctor.  Raise (elevate) your injured ankle above the level of your heart as much as possible for 2-3 days.  Use crutches if your doctor tells you to. Slowly put your own weight on the affected ankle. Use the crutches until you can walk without pain.  If you have a plaster splint:  Do not rest it on anything harder than a pillow for 24 hours.  Do not put weight on it.  Do not get it wet.  Take it off to shower or bathe.  If given, use an elastic wrap or support stocking for support. Take the wrap off if your toes lose feeling (numb), tingle, or turn cold or blue.  If you have an air splint:  Add or let out air to make it comfortable.  Take it off at night and to shower and bathe.  Wiggle your toes and move your ankle up and down often while you are wearing it. GET HELP IF:  You have rapidly increasing bruising or puffiness (swelling).  Your toes feel very cold.  You lose feeling in your  foot.  Your medicine does not help your pain. GET HELP RIGHT AWAY IF:   Your toes lose feeling (numb) or turn blue.  You have severe pain that is increasing. MAKE SURE YOU:   Understand these instructions.  Will watch your condition.  Will get help right away if you are not doing well or get worse. Document Released: 09/04/2007 Document Revised: 08/02/2013 Document Reviewed: 09/30/2011 [facility] Patient Information 2015 [place], [place]. This information is not intended to replace advice given to you by your health care provider. Make sure you discuss any questions you have with your health care provider.  Knee Sprain A knee sprain is a tear in one of the strong, fibrous tissues that connect the bones (ligaments) in your knee. The severity of the sprain depends on how much of the ligament is torn. The tear can be either partial or complete. CAUSES  Often, sprains are a result of a fall or injury. The force of the impact causes the fibers of your ligament to stretch too much. This excess tension causes the fibers of your ligament to tear. SIGNS AND SYMPTOMS  You may have some loss of motion in your knee. Other symptoms include:  Bruising.  Pain in the knee area.  Tenderness of the knee to the touch.  Swelling. DIAGNOSIS  To diagnose a knee  sprain, your health care provider will physically examine your knee. Your health care provider may also suggest an X-ray exam of your knee to make sure no bones are broken. TREATMENT  If your ligament is only partially torn, treatment usually involves keeping the knee in a fixed position (immobilization) or bracing your knee for activities that require movement for several weeks. To do this, your health care provider will apply a bandage, cast, or splint to keep your knee from moving and to support your knee during movement until it heals. For a partially torn ligament, the healing process usually takes 4-6 weeks. If your ligament is completely  torn, depending on which ligament it is, you may need surgery to reconnect the ligament to the bone or reconstruct it. After surgery, a cast or splint may be applied and will need to stay on your knee for 4-6 weeks while your ligament heals. HOME CARE INSTRUCTIONS  Keep your injured knee elevated to decrease swelling.  To ease pain and swelling, apply ice to the injured area:  Put ice in a plastic bag.  Place a towel between your skin and the bag.  Leave the ice on for 20 minutes, 2-3 times a day.  Only take medicine for pain as directed by your health care provider.  Do not leave your knee unprotected until pain and stiffness go away (usually 4-6 weeks).  If you have a cast or splint, do not allow it to get wet. If you have been instructed not to remove it, cover it with a plastic bag when you shower or bathe. Do not swim.  Your health care provider may suggest exercises for you to do during your recovery to prevent or limit permanent weakness and stiffness. SEEK IMMEDIATE MEDICAL CARE IF:  Your cast or splint becomes damaged.  Your pain becomes worse.  You have significant pain, swelling, or numbness below the cast or splint. MAKE SURE YOU:  Understand these instructions.  Will watch your condition.  Will get help right away if you are not doing well or get worse. Document Released: 03/18/2005 Document Revised: 01/06/2013 Document Reviewed: 10/28/2012 [facility] Patient Information 2015 [place], [place]. This information is not intended to replace advice given to you by your health care provider. Make sure you discuss any questions you have with your health care provider.

## 2014-10-28 ENCOUNTER — Ambulatory Visit: Payer: Self-pay | Admitting: Urology

## 2014-11-03 DIAGNOSIS — G43909 Migraine, unspecified, not intractable, without status migrainosus: Secondary | ICD-10-CM | POA: Diagnosis not present

## 2014-11-03 DIAGNOSIS — F419 Anxiety disorder, unspecified: Secondary | ICD-10-CM | POA: Diagnosis not present

## 2014-11-03 DIAGNOSIS — M5136 Other intervertebral disc degeneration, lumbar region: Secondary | ICD-10-CM | POA: Diagnosis not present

## 2014-11-03 DIAGNOSIS — F329 Major depressive disorder, single episode, unspecified: Secondary | ICD-10-CM | POA: Diagnosis not present

## 2014-11-03 DIAGNOSIS — M47812 Spondylosis without myelopathy or radiculopathy, cervical region: Secondary | ICD-10-CM | POA: Diagnosis not present

## 2014-11-03 DIAGNOSIS — E039 Hypothyroidism, unspecified: Secondary | ICD-10-CM | POA: Diagnosis not present

## 2014-11-03 DIAGNOSIS — F17210 Nicotine dependence, cigarettes, uncomplicated: Secondary | ICD-10-CM | POA: Diagnosis not present

## 2014-11-03 DIAGNOSIS — K219 Gastro-esophageal reflux disease without esophagitis: Secondary | ICD-10-CM | POA: Diagnosis not present

## 2014-11-03 DIAGNOSIS — M545 Low back pain: Secondary | ICD-10-CM | POA: Diagnosis not present

## 2014-11-03 DIAGNOSIS — Z79899 Other long term (current) drug therapy: Secondary | ICD-10-CM | POA: Diagnosis not present

## 2014-11-03 DIAGNOSIS — G8929 Other chronic pain: Secondary | ICD-10-CM | POA: Diagnosis not present

## 2014-11-03 DIAGNOSIS — Z888 Allergy status to other drugs, medicaments and biological substances status: Secondary | ICD-10-CM | POA: Diagnosis not present

## 2014-11-03 DIAGNOSIS — M47816 Spondylosis without myelopathy or radiculopathy, lumbar region: Secondary | ICD-10-CM | POA: Diagnosis not present

## 2014-11-03 DIAGNOSIS — Z981 Arthrodesis status: Secondary | ICD-10-CM | POA: Diagnosis not present

## 2014-11-28 DIAGNOSIS — G8929 Other chronic pain: Secondary | ICD-10-CM | POA: Diagnosis not present

## 2014-11-28 DIAGNOSIS — M545 Low back pain: Secondary | ICD-10-CM | POA: Diagnosis not present

## 2014-11-28 DIAGNOSIS — M5136 Other intervertebral disc degeneration, lumbar region: Secondary | ICD-10-CM | POA: Diagnosis not present

## 2014-11-28 DIAGNOSIS — Z981 Arthrodesis status: Secondary | ICD-10-CM | POA: Diagnosis not present

## 2014-11-28 DIAGNOSIS — M47816 Spondylosis without myelopathy or radiculopathy, lumbar region: Secondary | ICD-10-CM | POA: Diagnosis not present

## 2014-11-29 DIAGNOSIS — Z79899 Other long term (current) drug therapy: Secondary | ICD-10-CM | POA: Diagnosis not present

## 2014-11-29 DIAGNOSIS — I509 Heart failure, unspecified: Secondary | ICD-10-CM | POA: Diagnosis not present

## 2014-11-29 DIAGNOSIS — M545 Low back pain: Secondary | ICD-10-CM | POA: Diagnosis not present

## 2014-11-29 DIAGNOSIS — G44209 Tension-type headache, unspecified, not intractable: Secondary | ICD-10-CM | POA: Diagnosis not present

## 2014-11-29 DIAGNOSIS — F17200 Nicotine dependence, unspecified, uncomplicated: Secondary | ICD-10-CM | POA: Diagnosis not present

## 2014-11-29 DIAGNOSIS — Z9889 Other specified postprocedural states: Secondary | ICD-10-CM | POA: Diagnosis not present

## 2014-11-29 DIAGNOSIS — R51 Headache: Secondary | ICD-10-CM | POA: Diagnosis not present

## 2014-12-15 ENCOUNTER — Other Ambulatory Visit: Payer: Self-pay

## 2014-12-15 NOTE — Patient Outreach (Signed)
[place] [facility]) Care Management  12/15/2014  [name] December 05, 1970 [id number]   Referral from [employer] Tier [employer] List, assigned Jon Billings, RN to outreach.  Thanks, Ronnell Freshwater. [place], [place] Assistant Phone: [phone] Fax: [phone]

## 2014-12-15 NOTE — Patient Outreach (Signed)
[facility] [facility]) Care Management  12/15/2014  [name] [birth year] [id number]   Referral Date: 12-13-14 Referral Source: [employer] Tier 2 List Referral Issue: Chronic Back pain and depression. Recent ED visits for pain issues. Most recent visit two weeks ago.   Patient seen by pain clinic.  Patient reports she is to have surgery on her back to kill nerves in her back on 12-15-14.  Patient reports depression and is on medication prescribed by PCP.  Denies any HTN, Diabetes, COPD, or Heart Failure. PCP: Dr. Selena Batten  Patient reports he lives with her mother.  Denies any falls DME: none  Assessment: Patient assessed and no needs identified from assessment.   Plan: RN Health Coach will send patient letter and brochure for future reference.  RN Health Coach will notify [name] to close case.    Jone Baseman, RN, MSN [place] [phone]

## 2014-12-16 DIAGNOSIS — F329 Major depressive disorder, single episode, unspecified: Secondary | ICD-10-CM | POA: Diagnosis not present

## 2014-12-16 DIAGNOSIS — G43909 Migraine, unspecified, not intractable, without status migrainosus: Secondary | ICD-10-CM | POA: Diagnosis not present

## 2014-12-16 DIAGNOSIS — F419 Anxiety disorder, unspecified: Secondary | ICD-10-CM | POA: Diagnosis not present

## 2014-12-16 DIAGNOSIS — Z888 Allergy status to other drugs, medicaments and biological substances status: Secondary | ICD-10-CM | POA: Diagnosis not present

## 2014-12-16 DIAGNOSIS — G8929 Other chronic pain: Secondary | ICD-10-CM | POA: Diagnosis not present

## 2014-12-16 DIAGNOSIS — F17210 Nicotine dependence, cigarettes, uncomplicated: Secondary | ICD-10-CM | POA: Diagnosis not present

## 2014-12-16 DIAGNOSIS — M5030 Other cervical disc degeneration, unspecified cervical region: Secondary | ICD-10-CM | POA: Diagnosis not present

## 2014-12-16 DIAGNOSIS — M5136 Other intervertebral disc degeneration, lumbar region: Secondary | ICD-10-CM | POA: Diagnosis not present

## 2014-12-16 DIAGNOSIS — Z981 Arthrodesis status: Secondary | ICD-10-CM | POA: Diagnosis not present

## 2014-12-16 DIAGNOSIS — M47812 Spondylosis without myelopathy or radiculopathy, cervical region: Secondary | ICD-10-CM | POA: Diagnosis not present

## 2014-12-16 DIAGNOSIS — K219 Gastro-esophageal reflux disease without esophagitis: Secondary | ICD-10-CM | POA: Diagnosis not present

## 2014-12-16 DIAGNOSIS — M47816 Spondylosis without myelopathy or radiculopathy, lumbar region: Secondary | ICD-10-CM | POA: Diagnosis not present

## 2014-12-16 DIAGNOSIS — M545 Low back pain: Secondary | ICD-10-CM | POA: Diagnosis not present

## 2014-12-16 DIAGNOSIS — Z79899 Other long term (current) drug therapy: Secondary | ICD-10-CM | POA: Diagnosis not present

## 2014-12-16 DIAGNOSIS — Z886 Allergy status to analgesic agent status: Secondary | ICD-10-CM | POA: Diagnosis not present

## 2014-12-16 DIAGNOSIS — E039 Hypothyroidism, unspecified: Secondary | ICD-10-CM | POA: Diagnosis not present

## 2014-12-16 DIAGNOSIS — M792 Neuralgia and neuritis, unspecified: Secondary | ICD-10-CM | POA: Diagnosis not present

## 2014-12-20 NOTE — Patient Outreach (Signed)
[facility]) Care Management  12/20/2014  [name] [birth year] [id number]   Notification from Jon Billings, RN to close case due to this patient as no needs identified upon assessment for [facility] Care Management.  Thanks, Ronnell Freshwater. [place], [place] Management [facility] CM Assistant Phone: [phone] Fax: [phone]'

## 2014-12-26 DIAGNOSIS — G894 Chronic pain syndrome: Secondary | ICD-10-CM | POA: Diagnosis not present

## 2014-12-26 DIAGNOSIS — R252 Cramp and spasm: Secondary | ICD-10-CM | POA: Diagnosis not present

## 2014-12-26 DIAGNOSIS — Z6822 Body mass index (BMI) 22.0-22.9, adult: Secondary | ICD-10-CM | POA: Diagnosis not present

## 2014-12-26 DIAGNOSIS — F419 Anxiety disorder, unspecified: Secondary | ICD-10-CM | POA: Diagnosis not present

## 2014-12-26 DIAGNOSIS — Z1389 Encounter for screening for other disorder: Secondary | ICD-10-CM | POA: Diagnosis not present

## 2014-12-26 DIAGNOSIS — Z23 Encounter for immunization: Secondary | ICD-10-CM | POA: Diagnosis not present

## 2015-01-06 ENCOUNTER — Ambulatory Visit (INDEPENDENT_AMBULATORY_CARE_PROVIDER_SITE_OTHER): Payer: Medicare Other | Admitting: Urology

## 2015-01-06 DIAGNOSIS — A5901 Trichomonal vulvovaginitis: Secondary | ICD-10-CM | POA: Diagnosis not present

## 2015-01-06 DIAGNOSIS — N3946 Mixed incontinence: Secondary | ICD-10-CM | POA: Diagnosis not present

## 2015-01-06 DIAGNOSIS — N393 Stress incontinence (female) (male): Secondary | ICD-10-CM | POA: Diagnosis not present

## 2015-01-11 DIAGNOSIS — M47816 Spondylosis without myelopathy or radiculopathy, lumbar region: Secondary | ICD-10-CM | POA: Diagnosis not present

## 2015-01-11 DIAGNOSIS — M5136 Other intervertebral disc degeneration, lumbar region: Secondary | ICD-10-CM | POA: Diagnosis not present

## 2015-01-17 DIAGNOSIS — L0291 Cutaneous abscess, unspecified: Secondary | ICD-10-CM | POA: Diagnosis not present

## 2015-01-25 DIAGNOSIS — R32 Unspecified urinary incontinence: Secondary | ICD-10-CM | POA: Diagnosis not present

## 2015-01-25 DIAGNOSIS — N3941 Urge incontinence: Secondary | ICD-10-CM | POA: Diagnosis not present

## 2015-01-26 DIAGNOSIS — G43109 Migraine with aura, not intractable, without status migrainosus: Secondary | ICD-10-CM | POA: Diagnosis not present

## 2015-01-26 DIAGNOSIS — F17200 Nicotine dependence, unspecified, uncomplicated: Secondary | ICD-10-CM | POA: Diagnosis not present

## 2015-01-26 DIAGNOSIS — Z79899 Other long term (current) drug therapy: Secondary | ICD-10-CM | POA: Diagnosis not present

## 2015-01-26 DIAGNOSIS — K219 Gastro-esophageal reflux disease without esophagitis: Secondary | ICD-10-CM | POA: Diagnosis not present

## 2015-01-30 DIAGNOSIS — J209 Acute bronchitis, unspecified: Secondary | ICD-10-CM | POA: Diagnosis not present

## 2015-01-30 DIAGNOSIS — J019 Acute sinusitis, unspecified: Secondary | ICD-10-CM | POA: Diagnosis not present

## 2015-02-12 DIAGNOSIS — G43109 Migraine with aura, not intractable, without status migrainosus: Secondary | ICD-10-CM | POA: Diagnosis not present

## 2015-02-12 DIAGNOSIS — Z79899 Other long term (current) drug therapy: Secondary | ICD-10-CM | POA: Diagnosis not present

## 2015-02-12 DIAGNOSIS — F17200 Nicotine dependence, unspecified, uncomplicated: Secondary | ICD-10-CM | POA: Diagnosis not present

## 2015-02-12 DIAGNOSIS — B373 Candidiasis of vulva and vagina: Secondary | ICD-10-CM | POA: Diagnosis not present

## 2015-02-12 DIAGNOSIS — G43909 Migraine, unspecified, not intractable, without status migrainosus: Secondary | ICD-10-CM | POA: Diagnosis not present

## 2015-03-10 ENCOUNTER — Ambulatory Visit (HOSPITAL_COMMUNITY)
Admission: RE | Admit: 2015-03-10 | Discharge: 2015-03-10 | Disposition: A | Discharge status: Home / Self Care | Payer: Medicare Other | Source: Ambulatory Visit | Attending: Internal Medicine | Admitting: Internal Medicine

## 2015-03-10 ENCOUNTER — Other Ambulatory Visit (HOSPITAL_COMMUNITY): Payer: Self-pay | Admitting: Internal Medicine

## 2015-03-10 DIAGNOSIS — G894 Chronic pain syndrome: Secondary | ICD-10-CM | POA: Diagnosis not present

## 2015-03-10 DIAGNOSIS — M545 Low back pain: Secondary | ICD-10-CM

## 2015-03-10 DIAGNOSIS — F419 Anxiety disorder, unspecified: Secondary | ICD-10-CM | POA: Diagnosis not present

## 2015-03-10 DIAGNOSIS — R251 Tremor, unspecified: Secondary | ICD-10-CM | POA: Diagnosis not present

## 2015-03-10 DIAGNOSIS — Z6823 Body mass index (BMI) 23.0-23.9, adult: Secondary | ICD-10-CM | POA: Diagnosis not present

## 2015-03-10 DIAGNOSIS — G8929 Other chronic pain: Secondary | ICD-10-CM | POA: Diagnosis not present

## 2015-03-10 DIAGNOSIS — N760 Acute vaginitis: Secondary | ICD-10-CM | POA: Diagnosis not present

## 2015-03-10 DIAGNOSIS — Z1389 Encounter for screening for other disorder: Secondary | ICD-10-CM | POA: Diagnosis not present

## 2015-03-14 ENCOUNTER — Other Ambulatory Visit: Payer: Self-pay | Admitting: Internal Medicine

## 2015-03-14 ENCOUNTER — Other Ambulatory Visit: Payer: Self-pay

## 2015-03-14 DIAGNOSIS — Z1231 Encounter for screening mammogram for malignant neoplasm of breast: Secondary | ICD-10-CM

## 2015-03-16 DIAGNOSIS — Z206 Contact with and (suspected) exposure to human immunodeficiency virus [HIV]: Secondary | ICD-10-CM | POA: Diagnosis not present

## 2015-03-16 DIAGNOSIS — Z113 Encounter for screening for infections with a predominantly sexual mode of transmission: Secondary | ICD-10-CM | POA: Diagnosis not present

## 2015-03-16 DIAGNOSIS — Z114 Encounter for screening for human immunodeficiency virus [HIV]: Secondary | ICD-10-CM | POA: Diagnosis not present

## 2015-03-16 DIAGNOSIS — Z1389 Encounter for screening for other disorder: Secondary | ICD-10-CM | POA: Diagnosis not present

## 2015-03-16 DIAGNOSIS — Z7251 High risk heterosexual behavior: Secondary | ICD-10-CM | POA: Diagnosis not present

## 2015-03-16 DIAGNOSIS — Z6823 Body mass index (BMI) 23.0-23.9, adult: Secondary | ICD-10-CM | POA: Diagnosis not present

## 2015-03-17 ENCOUNTER — Ambulatory Visit (INDEPENDENT_AMBULATORY_CARE_PROVIDER_SITE_OTHER): Payer: Medicare Other | Admitting: Urology

## 2015-03-17 DIAGNOSIS — N3946 Mixed incontinence: Secondary | ICD-10-CM | POA: Diagnosis not present

## 2015-03-17 DIAGNOSIS — Z8619 Personal history of other infectious and parasitic diseases: Secondary | ICD-10-CM | POA: Diagnosis not present

## 2015-03-20 ENCOUNTER — Ambulatory Visit (HOSPITAL_COMMUNITY): Payer: Self-pay

## 2015-04-15 DIAGNOSIS — G43909 Migraine, unspecified, not intractable, without status migrainosus: Secondary | ICD-10-CM | POA: Diagnosis not present

## 2015-04-15 DIAGNOSIS — F17200 Nicotine dependence, unspecified, uncomplicated: Secondary | ICD-10-CM | POA: Diagnosis not present

## 2015-04-15 DIAGNOSIS — Z79899 Other long term (current) drug therapy: Secondary | ICD-10-CM | POA: Diagnosis not present

## 2015-04-17 DIAGNOSIS — F5101 Primary insomnia: Secondary | ICD-10-CM | POA: Diagnosis not present

## 2015-04-17 DIAGNOSIS — M542 Cervicalgia: Secondary | ICD-10-CM | POA: Diagnosis not present

## 2015-04-17 DIAGNOSIS — R5383 Other fatigue: Secondary | ICD-10-CM | POA: Diagnosis not present

## 2015-04-17 DIAGNOSIS — F329 Major depressive disorder, single episode, unspecified: Secondary | ICD-10-CM | POA: Diagnosis not present

## 2015-04-17 DIAGNOSIS — G4459 Other complicated headache syndrome: Secondary | ICD-10-CM | POA: Diagnosis not present

## 2015-04-17 DIAGNOSIS — M545 Low back pain: Secondary | ICD-10-CM | POA: Diagnosis not present

## 2015-04-17 DIAGNOSIS — R569 Unspecified convulsions: Secondary | ICD-10-CM | POA: Diagnosis not present

## 2015-05-30 DIAGNOSIS — M545 Low back pain: Secondary | ICD-10-CM | POA: Diagnosis not present

## 2015-05-30 DIAGNOSIS — Z888 Allergy status to other drugs, medicaments and biological substances status: Secondary | ICD-10-CM | POA: Diagnosis not present

## 2015-05-30 DIAGNOSIS — R071 Chest pain on breathing: Secondary | ICD-10-CM | POA: Diagnosis not present

## 2015-05-30 DIAGNOSIS — R0602 Shortness of breath: Secondary | ICD-10-CM | POA: Diagnosis not present

## 2015-05-30 DIAGNOSIS — F418 Other specified anxiety disorders: Secondary | ICD-10-CM | POA: Diagnosis not present

## 2015-05-30 DIAGNOSIS — F17200 Nicotine dependence, unspecified, uncomplicated: Secondary | ICD-10-CM | POA: Diagnosis not present

## 2015-05-30 DIAGNOSIS — R290 Tetany: Secondary | ICD-10-CM | POA: Diagnosis not present

## 2015-05-30 DIAGNOSIS — K219 Gastro-esophageal reflux disease without esophagitis: Secondary | ICD-10-CM | POA: Diagnosis not present

## 2015-05-30 DIAGNOSIS — E876 Hypokalemia: Secondary | ICD-10-CM | POA: Diagnosis not present

## 2015-05-30 DIAGNOSIS — R079 Chest pain, unspecified: Secondary | ICD-10-CM | POA: Diagnosis not present

## 2015-05-30 DIAGNOSIS — Z79899 Other long term (current) drug therapy: Secondary | ICD-10-CM | POA: Diagnosis not present

## 2015-05-31 DIAGNOSIS — R0902 Hypoxemia: Secondary | ICD-10-CM | POA: Diagnosis not present

## 2015-05-31 DIAGNOSIS — R290 Tetany: Secondary | ICD-10-CM | POA: Diagnosis not present

## 2015-05-31 DIAGNOSIS — R079 Chest pain, unspecified: Secondary | ICD-10-CM | POA: Diagnosis not present

## 2015-06-16 DIAGNOSIS — E785 Hyperlipidemia, unspecified: Secondary | ICD-10-CM | POA: Diagnosis not present

## 2015-06-16 DIAGNOSIS — Z79891 Long term (current) use of opiate analgesic: Secondary | ICD-10-CM | POA: Diagnosis not present

## 2015-06-16 DIAGNOSIS — Z1389 Encounter for screening for other disorder: Secondary | ICD-10-CM | POA: Diagnosis not present

## 2015-06-19 ENCOUNTER — Ambulatory Visit (HOSPITAL_COMMUNITY): Payer: Self-pay

## 2015-06-28 ENCOUNTER — Ambulatory Visit (HOSPITAL_COMMUNITY)
Admission: RE | Admit: 2015-06-28 | Discharge: 2015-06-28 | Disposition: A | Discharge status: Home / Self Care | Payer: Medicare Other | Source: Ambulatory Visit | Attending: Internal Medicine | Admitting: Internal Medicine

## 2015-06-28 DIAGNOSIS — Z1231 Encounter for screening mammogram for malignant neoplasm of breast: Secondary | ICD-10-CM

## 2015-06-30 DIAGNOSIS — Z1389 Encounter for screening for other disorder: Secondary | ICD-10-CM | POA: Diagnosis not present

## 2015-06-30 DIAGNOSIS — Z6823 Body mass index (BMI) 23.0-23.9, adult: Secondary | ICD-10-CM | POA: Diagnosis not present

## 2015-06-30 DIAGNOSIS — M7989 Other specified soft tissue disorders: Secondary | ICD-10-CM | POA: Diagnosis not present

## 2015-07-07 DIAGNOSIS — M7989 Other specified soft tissue disorders: Secondary | ICD-10-CM | POA: Diagnosis not present

## 2015-07-17 ENCOUNTER — Other Ambulatory Visit (HOSPITAL_COMMUNITY): Payer: Self-pay | Admitting: Registered Nurse

## 2015-07-17 ENCOUNTER — Ambulatory Visit (HOSPITAL_COMMUNITY)
Admission: RE | Admit: 2015-07-17 | Discharge: 2015-07-17 | Disposition: A | Discharge status: Home / Self Care | Payer: Medicare Other | Source: Ambulatory Visit | Attending: Registered Nurse | Admitting: Registered Nurse

## 2015-07-17 DIAGNOSIS — Z1389 Encounter for screening for other disorder: Secondary | ICD-10-CM

## 2015-07-17 DIAGNOSIS — M25519 Pain in unspecified shoulder: Secondary | ICD-10-CM | POA: Diagnosis not present

## 2015-07-17 DIAGNOSIS — M7581 Other shoulder lesions, right shoulder: Secondary | ICD-10-CM | POA: Diagnosis not present

## 2015-07-17 DIAGNOSIS — M25512 Pain in left shoulder: Secondary | ICD-10-CM | POA: Insufficient documentation

## 2015-07-17 DIAGNOSIS — Z6823 Body mass index (BMI) 23.0-23.9, adult: Secondary | ICD-10-CM | POA: Diagnosis not present

## 2015-07-27 DIAGNOSIS — M7542 Impingement syndrome of left shoulder: Secondary | ICD-10-CM | POA: Diagnosis not present

## 2015-07-27 DIAGNOSIS — M25512 Pain in left shoulder: Secondary | ICD-10-CM | POA: Diagnosis not present

## 2015-07-27 DIAGNOSIS — M7502 Adhesive capsulitis of left shoulder: Secondary | ICD-10-CM | POA: Diagnosis not present

## 2015-08-02 DIAGNOSIS — M25512 Pain in left shoulder: Secondary | ICD-10-CM | POA: Diagnosis not present

## 2015-08-02 DIAGNOSIS — M7542 Impingement syndrome of left shoulder: Secondary | ICD-10-CM | POA: Diagnosis not present

## 2015-08-02 DIAGNOSIS — M7502 Adhesive capsulitis of left shoulder: Secondary | ICD-10-CM | POA: Diagnosis not present

## 2015-08-14 DIAGNOSIS — M25512 Pain in left shoulder: Secondary | ICD-10-CM | POA: Diagnosis not present

## 2015-08-14 DIAGNOSIS — G8929 Other chronic pain: Secondary | ICD-10-CM | POA: Diagnosis not present

## 2015-08-18 DIAGNOSIS — M25512 Pain in left shoulder: Secondary | ICD-10-CM | POA: Diagnosis not present

## 2015-08-18 DIAGNOSIS — M7542 Impingement syndrome of left shoulder: Secondary | ICD-10-CM | POA: Diagnosis not present

## 2015-08-18 DIAGNOSIS — M7502 Adhesive capsulitis of left shoulder: Secondary | ICD-10-CM | POA: Diagnosis not present

## 2015-08-22 DIAGNOSIS — J40 Bronchitis, not specified as acute or chronic: Secondary | ICD-10-CM | POA: Diagnosis not present

## 2015-08-22 DIAGNOSIS — R05 Cough: Secondary | ICD-10-CM | POA: Diagnosis not present

## 2015-08-29 DIAGNOSIS — F17200 Nicotine dependence, unspecified, uncomplicated: Secondary | ICD-10-CM | POA: Diagnosis not present

## 2015-08-29 DIAGNOSIS — Z79899 Other long term (current) drug therapy: Secondary | ICD-10-CM | POA: Diagnosis not present

## 2015-08-29 DIAGNOSIS — K219 Gastro-esophageal reflux disease without esophagitis: Secondary | ICD-10-CM | POA: Diagnosis not present

## 2015-08-29 DIAGNOSIS — G43909 Migraine, unspecified, not intractable, without status migrainosus: Secondary | ICD-10-CM | POA: Diagnosis not present

## 2015-09-11 DIAGNOSIS — M25512 Pain in left shoulder: Secondary | ICD-10-CM | POA: Diagnosis not present

## 2015-09-11 DIAGNOSIS — M7502 Adhesive capsulitis of left shoulder: Secondary | ICD-10-CM | POA: Diagnosis not present

## 2015-09-11 DIAGNOSIS — M7542 Impingement syndrome of left shoulder: Secondary | ICD-10-CM | POA: Diagnosis not present

## 2015-09-15 ENCOUNTER — Ambulatory Visit: Payer: Self-pay | Admitting: Urology

## 2015-09-19 DIAGNOSIS — M7542 Impingement syndrome of left shoulder: Secondary | ICD-10-CM | POA: Diagnosis not present

## 2015-09-19 DIAGNOSIS — M7502 Adhesive capsulitis of left shoulder: Secondary | ICD-10-CM | POA: Diagnosis not present

## 2015-09-19 DIAGNOSIS — M25512 Pain in left shoulder: Secondary | ICD-10-CM | POA: Diagnosis not present

## 2015-10-07 DIAGNOSIS — H5710 Ocular pain, unspecified eye: Secondary | ICD-10-CM | POA: Diagnosis not present

## 2015-10-18 DIAGNOSIS — G8929 Other chronic pain: Secondary | ICD-10-CM | POA: Diagnosis not present

## 2015-10-18 DIAGNOSIS — M7542 Impingement syndrome of left shoulder: Secondary | ICD-10-CM | POA: Diagnosis not present

## 2015-10-18 DIAGNOSIS — M25512 Pain in left shoulder: Secondary | ICD-10-CM | POA: Diagnosis not present

## 2015-10-18 DIAGNOSIS — M7502 Adhesive capsulitis of left shoulder: Secondary | ICD-10-CM | POA: Diagnosis not present

## 2015-10-29 ENCOUNTER — Emergency Department (HOSPITAL_COMMUNITY): Payer: Medicare Other

## 2015-10-29 ENCOUNTER — Encounter (HOSPITAL_COMMUNITY): Payer: Self-pay

## 2015-10-29 ENCOUNTER — Emergency Department (HOSPITAL_COMMUNITY)
Admission: EM | Admit: 2015-10-29 | Discharge: 2015-10-29 | Disposition: A | Discharge status: Home / Self Care | Payer: Medicare Other | Attending: Emergency Medicine | Admitting: Emergency Medicine

## 2015-10-29 DIAGNOSIS — F17210 Nicotine dependence, cigarettes, uncomplicated: Secondary | ICD-10-CM | POA: Insufficient documentation

## 2015-10-29 DIAGNOSIS — M25512 Pain in left shoulder: Secondary | ICD-10-CM

## 2015-10-29 MED ORDER — OXYCODONE-ACETAMINOPHEN 5-325 MG PO TABS
1.0000 | ORAL_TABLET | Freq: Once | ORAL | Status: AC
  Administered 2015-10-29: 1 via ORAL
  Filled 2015-10-29: qty 1

## 2015-10-29 MED ORDER — PREDNISONE 10 MG PO TABS: ORAL_TABLET | ORAL | 0 refills | Status: DC

## 2015-10-29 MED ORDER — HYDROCODONE-ACETAMINOPHEN 7.5-325 MG PO TABS: 1.0000 | ORAL_TABLET | Freq: Four times a day (QID) | ORAL | 0 refills | Status: DC | PRN

## 2015-10-29 NOTE — ED Notes (Signed)
Pt back from Radiology. 

## 2015-10-29 NOTE — ED Triage Notes (Signed)
Left shoulder pain. Patient has been treated at [facility] orthopedic and is in pain management for shoulder pain. Denies injury.

## 2015-10-29 NOTE — ED Notes (Signed)
Pt to Radiology

## 2015-10-29 NOTE — Discharge Instructions (Signed)
Apply ice packs on/off to your shoulder.  Call your orthopedic doctor tomorrow to arrange a follow-up or referral to pain management

## 2015-11-01 NOTE — ED Provider Notes (Signed)
[facility] DE[facility] Provider Note   CSN: [id number] Arrival date & time: 10/29/15  2102  First Provider Contact:  First MD Initiated Contact with Patient 10/29/15 2140        History   Chief Complaint Chief Complaint  Patient presents with  . Shoulder Pain    HPI [name] is a 45 y.o. female.  HPI   [name] is a 45 y.o. female who presents to the Emergency Department complaining of left shoulder pain.  She states this is a chronic problem and has been seeing an orthopedic doctor and is waiting for an appt with pain management.  Pain became worse after helping her elderly mother.  She states that the pain has increased for several days and she is unable to raise her left arm.  She denies neck or chest pain, swelling or numbness of the arm.    Past Medical History:  Diagnosis Date  . Anxiety   . Chronic back pain   . Chronic neck pain   . GERD (gastroesophageal reflux disease)   . Headache    migraines- last one 2 months ago  . Hypercholesteremia   . Reflex sympathetic dystrophy   . Thyroid disease     Patient Active Problem List   Diagnosis Date Noted  . Follicular neoplasm of thyroid 03/14/2014    Past Surgical History:  Procedure Laterality Date  . ABDOMINAL HYSTERECTOMY     partial  . BACK SURGERY     plates and screws  . BREAST SURGERY Left    fibrocystic tumor  . CYST EXCISION Left    foot  . EAR CYST EXCISION Right 01/27/2013   Procedure: EXCISION CYST ON SCALP ;  Surgeon: Jamesetta So, MD;  Location: [facility];  Service: General;  Laterality: Right;  . EAR CYST EXCISION Right 01/27/2013   Procedure: CYST REMOVAL right axilla;  Surgeon: Jamesetta So, MD;  Location: [facility];  Service: General;  Laterality: Right;  . KNEE ARTHROSCOPY Left   . NECK SURGERY     plate in neck  . SALPINGOOPHORECTOMY Right   . STRABISMUS SURGERY Bilateral   . THYROIDECTOMY N/A 03/14/2014   Procedure: TOTAL THYROIDECTOMY;  Surgeon: Jamesetta So, MD;   Location: [facility];  Service: General;  Laterality: N/A;    OB History    No data available       Home Medications    Prior to Admission medications   Medication Sig Start Date End Date Taking? Authorizing Provider  alprazolam Duanne Moron) 2 MG tablet Take 1 tablet (2 mg total) by mouth 4 (four) times daily as needed for anxiety. 03/29/13   Carmin Muskrat, MD  calcium-vitamin D (OSCAL WITH D) 500-200 MG-UNIT per tablet Take 1 tablet by mouth 2 (two) times daily. 03/15/14   Aviva Signs, MD  citalopram (CELEXA) 40 MG tablet Take 40 mg by mouth daily.    Historical Provider, MD  cyclobenzaprine (FLEXERIL) 10 MG tablet Take 10 mg by mouth 3 (three) times daily as needed for muscle spasms.    Historical Provider, MD  diazepam (VALIUM) 10 MG tablet Take 10 mg by mouth 4 (four) times daily. 12/20/13   Historical Provider, MD  HYDROcodone-acetaminophen (NORCO) 7.5-325 MG tablet Take 1 tablet by mouth every 6 (six) hours as needed for moderate pain. 10/29/15   Verlan Grotz, PA-C  levothyroxine (SYNTHROID) 125 MCG tablet Take 1 tablet (125 mcg total) by mouth daily before breakfast. 03/15/14   Aviva Signs, MD  [name]  40 MG capsule Take 40 mg by mouth daily. 03/11/13   Historical Provider, MD  nitrofurantoin, macrocrystal-monohydrate, (MACROBID) 100 MG capsule Take 1 capsule (100 mg total) by mouth 2 (two) times daily. X 7 days 01/10/14   Nat Christen, MD  Omega-3 Fatty Acids (FISH OIL PO) Take 1,000 mg by mouth 2 (two) times daily.    Historical Provider, MD  OVER THE COUNTER MEDICATION Take 1 tablet by mouth 2 (two) times daily. OTC sinus medication    Historical Provider, MD  predniSONE (DELTASONE) 10 MG tablet Take 6 tablets day one, 5 tablets day two, 4 tablets day three, 3 tablets day four, 2 tablets day five, then 1 tablet day six 10/29/15   Graden Hoshino, PA-C  promethazine (PHENERGAN) 25 MG tablet Take 1 tablet (25 mg total) by mouth every 6 (six) hours as needed. Patient taking differently: Take  25 mg by mouth every 6 (six) hours as needed for nausea.  01/10/14   Nat Christen, MD  rizatriptan (MAXALT-MLT) 10 MG disintegrating tablet Take 1 tablet by mouth as needed. For migraine 01/05/13   Historical Provider, MD  topiramate (TOPAMAX) 25 MG tablet Take 25 mg by mouth 2 (two) times daily. 03/11/13   Historical Provider, MD  traZODone (DESYREL) 50 MG tablet Take 50-100 mg by mouth at bedtime as needed for sleep.    Historical Provider, MD    Family History History reviewed. No pertinent family history.  Social History Social History  Substance Use Topics  . Smoking status: Current Every Day Smoker    Packs/day: 1.00    Years: 20.00    Types: Cigarettes  . Smokeless tobacco: Never Used  . Alcohol use Yes     Comment: occasionally     Allergies   Darvocet [propoxyphene n-acetaminophen] and Lyrica [pregabalin]   Review of Systems Review of Systems  Constitutional: Negative for chills and fever.  Respiratory: Negative for shortness of breath.   Cardiovascular: Negative for chest pain.  Genitourinary: Negative for difficulty urinating and dysuria.  Musculoskeletal: Positive for arthralgias (left shoulder pain). Negative for joint swelling and neck pain.  Skin: Negative for color change and wound.  All other systems reviewed and are negative.    Physical Exam Updated Vital Signs BP 126/75 (BP Location: Right Arm)   Pulse 64   Temp 97.8 F (36.6 C) (Oral)   Resp 16   Ht 5\' 10"  (1.778 m)   Wt 77.1 kg   SpO2 100%   BMI 24.39 kg/m   Physical Exam  Constitutional: She is oriented to person, place, and time. She appears well-developed and well-nourished. No distress.  HENT:  Head: Normocephalic and atraumatic.  Neck: Normal range of motion. Neck supple. No thyromegaly present.  Cardiovascular: Normal rate, regular rhythm and intact distal pulses.   No murmur heard. Pulmonary/Chest: Effort normal and breath sounds normal. No respiratory distress. She exhibits no  tenderness.  Musculoskeletal: She exhibits tenderness. She exhibits no edema.  ttp of the anterior left shoulder.  Pain reproduced with abduction.  Radial pulse is brisk, distal sensation intact, CR< 2 sec. Grip strength is strong and symmetrical.   No abrasions, edema , erythema or step-off deformity of the joint.   Lymphadenopathy:    She has no cervical adenopathy.  Neurological: She is alert and oriented to person, place, and time. She has normal strength. No sensory deficit. She exhibits normal muscle tone. Coordination normal.  Reflex Scores:      Tricep reflexes are 1+ on the right  side and 2+ on the left side.      Bicep reflexes are 1+ on the right side and 2+ on the left side. Skin: Skin is warm and dry.  Nursing note and vitals reviewed.    ED Treatments / Results  Labs (all labs ordered are listed, but only abnormal results are displayed) Labs Reviewed - No data to display  EKG  EKG Interpretation None       Radiology Dg Shoulder Left  Result Date: 10/29/2015 CLINICAL DATA:  Left shoulder pain. Patient has been treated by Orthopedics and is in pain management for shoulder pain per technologist note. No known injury. EXAM: LEFT SHOULDER - 2+ VIEW COMPARISON:  Radiographs 07/17/2015 FINDINGS: There is no evidence of fracture or dislocation. There is no evidence of arthropathy or other focal bone abnormality. Soft tissues are unremarkable. IMPRESSION: Negative radiographs of the left shoulder. Electronically Signed   By: Jeb Levering M.D.   On: 10/29/2015 22:50   Procedures Procedures (including critical care time)  Medications Ordered in ED Medications  oxyCODONE-acetaminophen (PERCOCET/ROXICET) 5-325 MG per tablet 1 tablet (1 tablet Oral Given 10/29/15 2211)     Initial Impression / Assessment and Plan / ED Course  I have reviewed the triage vital signs and the nursing notes.  Pertinent labs & imaging results that were available during my care of the patient  were reviewed by me and considered in my medical decision making (see chart for details).  Clinical Course    Pt with acute on chronic left shoulder pain.  No concerning sx's for septic joint.  NV intact.  Agrees to ortho f/u.    Final Clinical Impressions(s) / ED Diagnoses   Final diagnoses:  Left shoulder pain    New Prescriptions Discharge Medication List as of 10/29/2015 10:57 PM    START taking these medications   Details  HYDROcodone-acetaminophen (NORCO) 7.5-325 MG tablet Take 1 tablet by mouth every 6 (six) hours as needed for moderate pain., Starting Sun 10/29/2015, Print    predniSONE (DELTASONE) 10 MG tablet Take 6 tablets day one, 5 tablets day two, 4 tablets day three, 3 tablets day four, 2 tablets day five, then 1 tablet day six, Print         Kem Parkinson, PA-C 11/01/15 0110    Virgel Manifold, MD 11/02/15 2121

## 2015-11-14 DIAGNOSIS — M25519 Pain in unspecified shoulder: Secondary | ICD-10-CM | POA: Diagnosis not present

## 2015-11-14 DIAGNOSIS — M542 Cervicalgia: Secondary | ICD-10-CM | POA: Diagnosis not present

## 2015-11-14 DIAGNOSIS — G894 Chronic pain syndrome: Secondary | ICD-10-CM | POA: Diagnosis not present

## 2015-11-14 DIAGNOSIS — Z79891 Long term (current) use of opiate analgesic: Secondary | ICD-10-CM | POA: Diagnosis not present

## 2015-11-14 DIAGNOSIS — Z79899 Other long term (current) drug therapy: Secondary | ICD-10-CM | POA: Diagnosis not present

## 2015-11-14 DIAGNOSIS — M545 Low back pain: Secondary | ICD-10-CM | POA: Diagnosis not present

## 2015-11-22 DIAGNOSIS — M25512 Pain in left shoulder: Secondary | ICD-10-CM | POA: Diagnosis not present

## 2015-11-22 DIAGNOSIS — M7542 Impingement syndrome of left shoulder: Secondary | ICD-10-CM | POA: Diagnosis not present

## 2015-11-22 DIAGNOSIS — S40012A Contusion of left shoulder, initial encounter: Secondary | ICD-10-CM | POA: Diagnosis not present

## 2015-11-29 DIAGNOSIS — R631 Polydipsia: Secondary | ICD-10-CM | POA: Diagnosis not present

## 2015-11-29 DIAGNOSIS — E039 Hypothyroidism, unspecified: Secondary | ICD-10-CM | POA: Diagnosis not present

## 2015-11-29 DIAGNOSIS — E782 Mixed hyperlipidemia: Secondary | ICD-10-CM | POA: Diagnosis not present

## 2015-11-29 DIAGNOSIS — Z1389 Encounter for screening for other disorder: Secondary | ICD-10-CM | POA: Diagnosis not present

## 2015-12-11 DIAGNOSIS — M7542 Impingement syndrome of left shoulder: Secondary | ICD-10-CM | POA: Diagnosis not present

## 2015-12-11 DIAGNOSIS — M25512 Pain in left shoulder: Secondary | ICD-10-CM | POA: Diagnosis not present

## 2015-12-11 DIAGNOSIS — M7502 Adhesive capsulitis of left shoulder: Secondary | ICD-10-CM | POA: Diagnosis not present

## 2015-12-12 DIAGNOSIS — G5622 Lesion of ulnar nerve, left upper limb: Secondary | ICD-10-CM | POA: Diagnosis not present

## 2015-12-12 DIAGNOSIS — M79609 Pain in unspecified limb: Secondary | ICD-10-CM | POA: Diagnosis not present

## 2015-12-13 DIAGNOSIS — Z79899 Other long term (current) drug therapy: Secondary | ICD-10-CM | POA: Diagnosis not present

## 2015-12-13 DIAGNOSIS — M25519 Pain in unspecified shoulder: Secondary | ICD-10-CM | POA: Diagnosis not present

## 2015-12-13 DIAGNOSIS — G894 Chronic pain syndrome: Secondary | ICD-10-CM | POA: Diagnosis not present

## 2015-12-13 DIAGNOSIS — Z79891 Long term (current) use of opiate analgesic: Secondary | ICD-10-CM | POA: Diagnosis not present

## 2016-01-10 DIAGNOSIS — Z79899 Other long term (current) drug therapy: Secondary | ICD-10-CM | POA: Diagnosis not present

## 2016-01-10 DIAGNOSIS — Z79891 Long term (current) use of opiate analgesic: Secondary | ICD-10-CM | POA: Diagnosis not present

## 2016-01-10 DIAGNOSIS — M25519 Pain in unspecified shoulder: Secondary | ICD-10-CM | POA: Diagnosis not present

## 2016-01-10 DIAGNOSIS — G894 Chronic pain syndrome: Secondary | ICD-10-CM | POA: Diagnosis not present

## 2016-01-22 DIAGNOSIS — M25512 Pain in left shoulder: Secondary | ICD-10-CM | POA: Diagnosis not present

## 2016-01-22 DIAGNOSIS — M7542 Impingement syndrome of left shoulder: Secondary | ICD-10-CM | POA: Diagnosis not present

## 2016-02-07 DIAGNOSIS — Z1389 Encounter for screening for other disorder: Secondary | ICD-10-CM | POA: Diagnosis not present

## 2016-02-07 DIAGNOSIS — R102 Pelvic and perineal pain: Secondary | ICD-10-CM | POA: Diagnosis not present

## 2016-02-07 DIAGNOSIS — N3281 Overactive bladder: Secondary | ICD-10-CM | POA: Diagnosis not present

## 2016-02-07 DIAGNOSIS — K140 Glossitis: Secondary | ICD-10-CM | POA: Diagnosis not present

## 2016-02-07 DIAGNOSIS — R32 Unspecified urinary incontinence: Secondary | ICD-10-CM | POA: Diagnosis not present

## 2016-02-07 DIAGNOSIS — Z6824 Body mass index (BMI) 24.0-24.9, adult: Secondary | ICD-10-CM | POA: Diagnosis not present

## 2016-02-14 DIAGNOSIS — Z79899 Other long term (current) drug therapy: Secondary | ICD-10-CM | POA: Diagnosis not present

## 2016-02-14 DIAGNOSIS — X500XXA Overexertion from strenuous movement or load, initial encounter: Secondary | ICD-10-CM | POA: Diagnosis not present

## 2016-02-14 DIAGNOSIS — M25572 Pain in left ankle and joints of left foot: Secondary | ICD-10-CM | POA: Diagnosis not present

## 2016-02-14 DIAGNOSIS — S99922A Unspecified injury of left foot, initial encounter: Secondary | ICD-10-CM | POA: Diagnosis not present

## 2016-02-14 DIAGNOSIS — G8929 Other chronic pain: Secondary | ICD-10-CM | POA: Diagnosis not present

## 2016-02-14 DIAGNOSIS — M79672 Pain in left foot: Secondary | ICD-10-CM | POA: Diagnosis not present

## 2016-02-14 DIAGNOSIS — I509 Heart failure, unspecified: Secondary | ICD-10-CM | POA: Diagnosis not present

## 2016-02-14 DIAGNOSIS — S93602A Unspecified sprain of left foot, initial encounter: Secondary | ICD-10-CM | POA: Diagnosis not present

## 2016-02-19 DIAGNOSIS — T50901A Poisoning by unspecified drugs, medicaments and biological substances, accidental (unintentional), initial encounter: Secondary | ICD-10-CM | POA: Diagnosis not present

## 2016-02-19 DIAGNOSIS — J9690 Respiratory failure, unspecified, unspecified whether with hypoxia or hypercapnia: Secondary | ICD-10-CM | POA: Diagnosis not present

## 2016-02-19 DIAGNOSIS — G43909 Migraine, unspecified, not intractable, without status migrainosus: Secondary | ICD-10-CM | POA: Diagnosis not present

## 2016-02-19 DIAGNOSIS — M545 Low back pain: Secondary | ICD-10-CM | POA: Diagnosis not present

## 2016-02-19 DIAGNOSIS — Z79899 Other long term (current) drug therapy: Secondary | ICD-10-CM | POA: Diagnosis not present

## 2016-02-19 DIAGNOSIS — J9600 Acute respiratory failure, unspecified whether with hypoxia or hypercapnia: Secondary | ICD-10-CM | POA: Diagnosis not present

## 2016-02-19 DIAGNOSIS — Z639 Problem related to primary support group, unspecified: Secondary | ICD-10-CM | POA: Diagnosis not present

## 2016-02-19 DIAGNOSIS — T424X2A Poisoning by benzodiazepines, intentional self-harm, initial encounter: Secondary | ICD-10-CM | POA: Diagnosis not present

## 2016-02-19 DIAGNOSIS — Z720 Tobacco use: Secondary | ICD-10-CM | POA: Diagnosis not present

## 2016-02-19 DIAGNOSIS — T424X3A Poisoning by benzodiazepines, assault, initial encounter: Secondary | ICD-10-CM | POA: Diagnosis not present

## 2016-02-19 DIAGNOSIS — T424X4A Poisoning by benzodiazepines, undetermined, initial encounter: Secondary | ICD-10-CM | POA: Diagnosis not present

## 2016-02-20 DIAGNOSIS — Z639 Problem related to primary support group, unspecified: Secondary | ICD-10-CM | POA: Diagnosis not present

## 2016-02-20 DIAGNOSIS — T424X2A Poisoning by benzodiazepines, intentional self-harm, initial encounter: Secondary | ICD-10-CM | POA: Diagnosis present

## 2016-02-20 DIAGNOSIS — Z79899 Other long term (current) drug therapy: Secondary | ICD-10-CM | POA: Diagnosis not present

## 2016-02-20 DIAGNOSIS — T424X4A Poisoning by benzodiazepines, undetermined, initial encounter: Secondary | ICD-10-CM | POA: Diagnosis not present

## 2016-02-20 DIAGNOSIS — T50901A Poisoning by unspecified drugs, medicaments and biological substances, accidental (unintentional), initial encounter: Secondary | ICD-10-CM | POA: Diagnosis not present

## 2016-02-20 DIAGNOSIS — J9600 Acute respiratory failure, unspecified whether with hypoxia or hypercapnia: Secondary | ICD-10-CM | POA: Diagnosis present

## 2016-02-20 DIAGNOSIS — G43909 Migraine, unspecified, not intractable, without status migrainosus: Secondary | ICD-10-CM | POA: Diagnosis present

## 2016-02-20 DIAGNOSIS — M545 Low back pain: Secondary | ICD-10-CM | POA: Diagnosis present

## 2016-02-20 DIAGNOSIS — T424X3A Poisoning by benzodiazepines, assault, initial encounter: Secondary | ICD-10-CM | POA: Diagnosis not present

## 2016-02-20 DIAGNOSIS — J9690 Respiratory failure, unspecified, unspecified whether with hypoxia or hypercapnia: Secondary | ICD-10-CM | POA: Diagnosis not present

## 2016-02-22 DIAGNOSIS — R45851 Suicidal ideations: Secondary | ICD-10-CM | POA: Diagnosis present

## 2016-02-22 DIAGNOSIS — K219 Gastro-esophageal reflux disease without esophagitis: Secondary | ICD-10-CM | POA: Diagnosis present

## 2016-02-22 DIAGNOSIS — J9690 Respiratory failure, unspecified, unspecified whether with hypoxia or hypercapnia: Secondary | ICD-10-CM | POA: Diagnosis not present

## 2016-02-22 DIAGNOSIS — Z23 Encounter for immunization: Secondary | ICD-10-CM | POA: Diagnosis not present

## 2016-02-22 DIAGNOSIS — F332 Major depressive disorder, recurrent severe without psychotic features: Secondary | ICD-10-CM | POA: Diagnosis present

## 2016-02-22 DIAGNOSIS — B009 Herpesviral infection, unspecified: Secondary | ICD-10-CM | POA: Diagnosis present

## 2016-02-22 DIAGNOSIS — M519 Unspecified thoracic, thoracolumbar and lumbosacral intervertebral disc disorder: Secondary | ICD-10-CM | POA: Diagnosis present

## 2016-02-22 DIAGNOSIS — F17210 Nicotine dependence, cigarettes, uncomplicated: Secondary | ICD-10-CM | POA: Diagnosis present

## 2016-02-22 DIAGNOSIS — E039 Hypothyroidism, unspecified: Secondary | ICD-10-CM | POA: Diagnosis present

## 2016-02-22 DIAGNOSIS — T424X4A Poisoning by benzodiazepines, undetermined, initial encounter: Secondary | ICD-10-CM | POA: Diagnosis not present

## 2016-02-22 DIAGNOSIS — J301 Allergic rhinitis due to pollen: Secondary | ICD-10-CM | POA: Diagnosis present

## 2016-03-01 DIAGNOSIS — Z1389 Encounter for screening for other disorder: Secondary | ICD-10-CM | POA: Diagnosis not present

## 2016-03-01 DIAGNOSIS — J329 Chronic sinusitis, unspecified: Secondary | ICD-10-CM | POA: Diagnosis not present

## 2016-03-01 DIAGNOSIS — Z6824 Body mass index (BMI) 24.0-24.9, adult: Secondary | ICD-10-CM | POA: Diagnosis not present

## 2016-03-01 DIAGNOSIS — R070 Pain in throat: Secondary | ICD-10-CM | POA: Diagnosis not present

## 2016-03-01 DIAGNOSIS — J069 Acute upper respiratory infection, unspecified: Secondary | ICD-10-CM | POA: Diagnosis not present

## 2016-03-01 DIAGNOSIS — J343 Hypertrophy of nasal turbinates: Secondary | ICD-10-CM | POA: Diagnosis not present

## 2016-03-18 DIAGNOSIS — Z6825 Body mass index (BMI) 25.0-25.9, adult: Secondary | ICD-10-CM | POA: Diagnosis not present

## 2016-03-18 DIAGNOSIS — Z1389 Encounter for screening for other disorder: Secondary | ICD-10-CM | POA: Diagnosis not present

## 2016-03-18 DIAGNOSIS — I10 Essential (primary) hypertension: Secondary | ICD-10-CM | POA: Diagnosis not present

## 2016-03-18 DIAGNOSIS — M810 Age-related osteoporosis without current pathological fracture: Secondary | ICD-10-CM | POA: Diagnosis not present

## 2016-03-18 DIAGNOSIS — N200 Calculus of kidney: Secondary | ICD-10-CM | POA: Diagnosis not present

## 2016-03-18 DIAGNOSIS — G4709 Other insomnia: Secondary | ICD-10-CM | POA: Diagnosis not present

## 2016-04-04 DIAGNOSIS — M25562 Pain in left knee: Secondary | ICD-10-CM | POA: Diagnosis not present

## 2016-04-04 DIAGNOSIS — Z79899 Other long term (current) drug therapy: Secondary | ICD-10-CM | POA: Diagnosis not present

## 2016-04-04 DIAGNOSIS — G8929 Other chronic pain: Secondary | ICD-10-CM | POA: Diagnosis not present

## 2016-04-12 DIAGNOSIS — J040 Acute laryngitis: Secondary | ICD-10-CM | POA: Diagnosis not present

## 2016-04-12 DIAGNOSIS — J029 Acute pharyngitis, unspecified: Secondary | ICD-10-CM | POA: Diagnosis not present

## 2016-04-12 DIAGNOSIS — I10 Essential (primary) hypertension: Secondary | ICD-10-CM | POA: Diagnosis not present

## 2016-04-12 DIAGNOSIS — J069 Acute upper respiratory infection, unspecified: Secondary | ICD-10-CM | POA: Diagnosis not present

## 2016-04-12 DIAGNOSIS — J343 Hypertrophy of nasal turbinates: Secondary | ICD-10-CM | POA: Diagnosis not present

## 2016-04-12 DIAGNOSIS — J019 Acute sinusitis, unspecified: Secondary | ICD-10-CM | POA: Diagnosis not present

## 2016-06-07 DIAGNOSIS — R079 Chest pain, unspecified: Secondary | ICD-10-CM | POA: Diagnosis not present

## 2016-06-07 DIAGNOSIS — I509 Heart failure, unspecified: Secondary | ICD-10-CM | POA: Diagnosis not present

## 2016-06-07 DIAGNOSIS — Z79899 Other long term (current) drug therapy: Secondary | ICD-10-CM | POA: Diagnosis not present

## 2016-08-02 DIAGNOSIS — Z1389 Encounter for screening for other disorder: Secondary | ICD-10-CM | POA: Diagnosis not present

## 2016-08-02 DIAGNOSIS — Z634 Disappearance and death of family member: Secondary | ICD-10-CM | POA: Diagnosis not present

## 2016-10-10 DIAGNOSIS — G8929 Other chronic pain: Secondary | ICD-10-CM | POA: Diagnosis not present

## 2016-10-10 DIAGNOSIS — Z79899 Other long term (current) drug therapy: Secondary | ICD-10-CM | POA: Diagnosis not present

## 2016-10-10 DIAGNOSIS — I509 Heart failure, unspecified: Secondary | ICD-10-CM | POA: Diagnosis not present

## 2016-10-10 DIAGNOSIS — R51 Headache: Secondary | ICD-10-CM | POA: Diagnosis not present

## 2016-10-10 DIAGNOSIS — M542 Cervicalgia: Secondary | ICD-10-CM | POA: Diagnosis not present

## 2016-12-28 DIAGNOSIS — Z79899 Other long term (current) drug therapy: Secondary | ICD-10-CM | POA: Diagnosis not present

## 2016-12-28 DIAGNOSIS — H9202 Otalgia, left ear: Secondary | ICD-10-CM | POA: Diagnosis not present

## 2016-12-28 DIAGNOSIS — H6982 Other specified disorders of Eustachian tube, left ear: Secondary | ICD-10-CM | POA: Diagnosis not present

## 2016-12-28 DIAGNOSIS — I509 Heart failure, unspecified: Secondary | ICD-10-CM | POA: Diagnosis not present

## 2017-01-01 DIAGNOSIS — E039 Hypothyroidism, unspecified: Secondary | ICD-10-CM | POA: Diagnosis not present

## 2017-01-01 DIAGNOSIS — H6692 Otitis media, unspecified, left ear: Secondary | ICD-10-CM | POA: Diagnosis not present

## 2017-01-22 DIAGNOSIS — E063 Autoimmune thyroiditis: Secondary | ICD-10-CM | POA: Diagnosis not present

## 2017-01-22 DIAGNOSIS — E039 Hypothyroidism, unspecified: Secondary | ICD-10-CM | POA: Diagnosis not present

## 2017-01-22 DIAGNOSIS — Z0000 Encounter for general adult medical examination without abnormal findings: Secondary | ICD-10-CM | POA: Diagnosis not present

## 2017-01-22 DIAGNOSIS — G894 Chronic pain syndrome: Secondary | ICD-10-CM | POA: Diagnosis not present

## 2017-01-22 DIAGNOSIS — R252 Cramp and spasm: Secondary | ICD-10-CM | POA: Diagnosis not present

## 2017-01-27 DIAGNOSIS — E039 Hypothyroidism, unspecified: Secondary | ICD-10-CM | POA: Diagnosis not present

## 2017-01-27 DIAGNOSIS — K7689 Other specified diseases of liver: Secondary | ICD-10-CM | POA: Diagnosis not present

## 2017-01-27 DIAGNOSIS — N390 Urinary tract infection, site not specified: Secondary | ICD-10-CM | POA: Diagnosis not present

## 2017-01-27 DIAGNOSIS — E876 Hypokalemia: Secondary | ICD-10-CM | POA: Diagnosis not present

## 2017-01-27 DIAGNOSIS — E538 Deficiency of other specified B group vitamins: Secondary | ICD-10-CM | POA: Diagnosis not present

## 2017-02-04 DIAGNOSIS — M25532 Pain in left wrist: Secondary | ICD-10-CM | POA: Diagnosis not present

## 2017-02-04 DIAGNOSIS — M79642 Pain in left hand: Secondary | ICD-10-CM | POA: Diagnosis not present

## 2017-02-04 DIAGNOSIS — E538 Deficiency of other specified B group vitamins: Secondary | ICD-10-CM | POA: Diagnosis not present

## 2017-02-04 DIAGNOSIS — I509 Heart failure, unspecified: Secondary | ICD-10-CM | POA: Diagnosis not present

## 2017-02-04 DIAGNOSIS — Z79899 Other long term (current) drug therapy: Secondary | ICD-10-CM | POA: Diagnosis not present

## 2017-02-04 DIAGNOSIS — Z23 Encounter for immunization: Secondary | ICD-10-CM | POA: Diagnosis not present

## 2017-02-04 DIAGNOSIS — S63642A Sprain of metacarpophalangeal joint of left thumb, initial encounter: Secondary | ICD-10-CM | POA: Diagnosis not present

## 2017-02-07 ENCOUNTER — Other Ambulatory Visit (HOSPITAL_COMMUNITY): Payer: Self-pay | Admitting: Internal Medicine

## 2017-02-07 DIAGNOSIS — Z1231 Encounter for screening mammogram for malignant neoplasm of breast: Secondary | ICD-10-CM

## 2017-02-11 DIAGNOSIS — D510 Vitamin B12 deficiency anemia due to intrinsic factor deficiency: Secondary | ICD-10-CM | POA: Diagnosis not present

## 2017-02-12 ENCOUNTER — Encounter (HOSPITAL_COMMUNITY): Payer: Self-pay

## 2017-02-12 ENCOUNTER — Ambulatory Visit (HOSPITAL_COMMUNITY)
Admission: RE | Admit: 2017-02-12 | Discharge: 2017-02-12 | Disposition: A | Discharge status: Home / Self Care | Payer: Medicare Other | Source: Ambulatory Visit | Attending: Internal Medicine | Admitting: Internal Medicine

## 2017-02-12 DIAGNOSIS — Z1231 Encounter for screening mammogram for malignant neoplasm of breast: Secondary | ICD-10-CM | POA: Diagnosis not present

## 2017-02-17 DIAGNOSIS — M25532 Pain in left wrist: Secondary | ICD-10-CM | POA: Diagnosis not present

## 2017-02-24 DIAGNOSIS — S63502D Unspecified sprain of left wrist, subsequent encounter: Secondary | ICD-10-CM | POA: Diagnosis not present

## 2017-02-26 IMAGING — DX DG LUMBAR SPINE 2-3V
3 series · 3 of 3 positions shown · non-contrast
Comparison: Lumbar spine series of June 22, 2014.

CLINICAL DATA: Chronic low back pain, history of surgery in 1111,
no radicular symptoms reported.

EXAM:
LUMBAR SPINE - 2-3 VIEW

[l-spine ap]
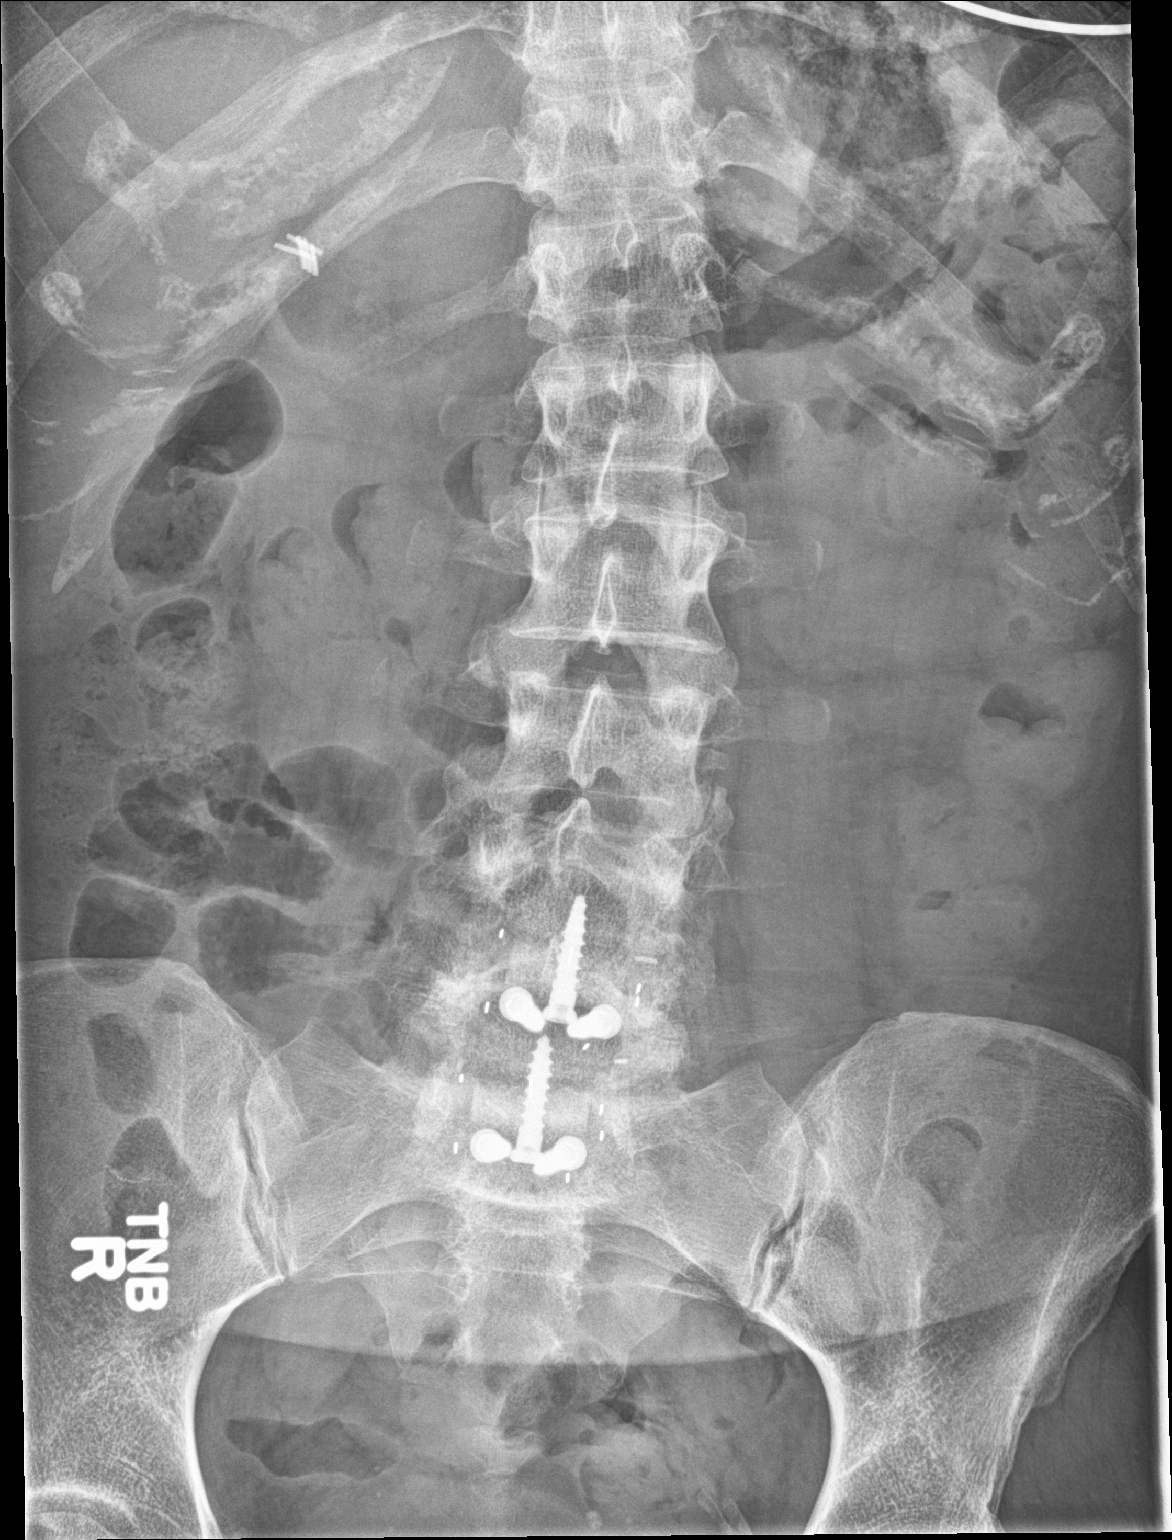

[l-spine lat (1 of 2)]
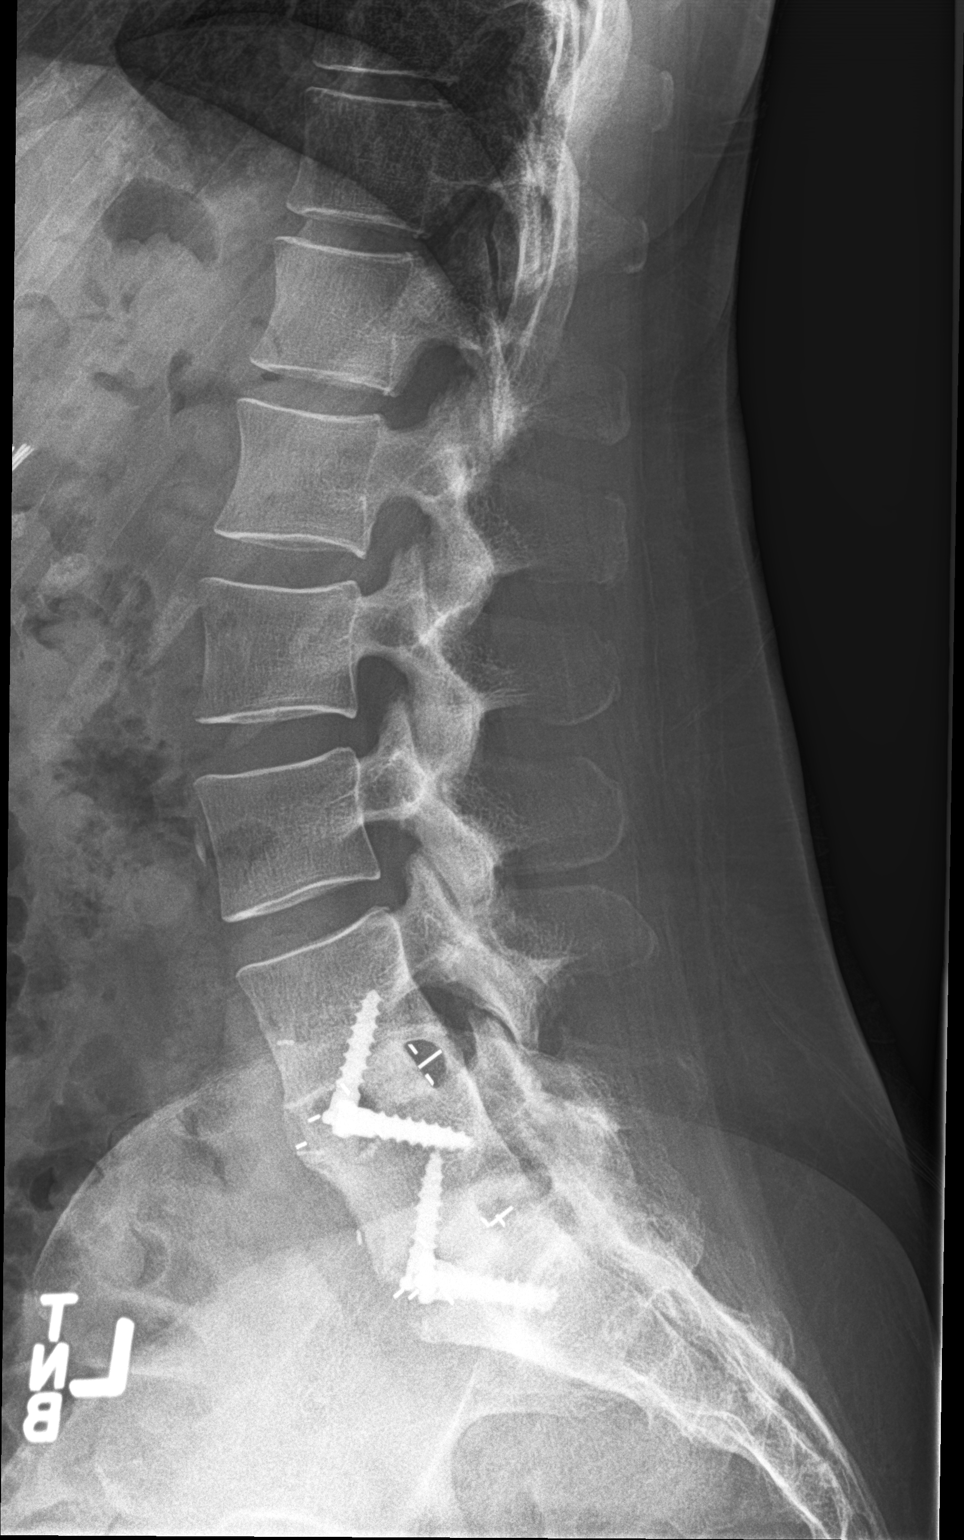

[l-spine lat (2 of 2)]
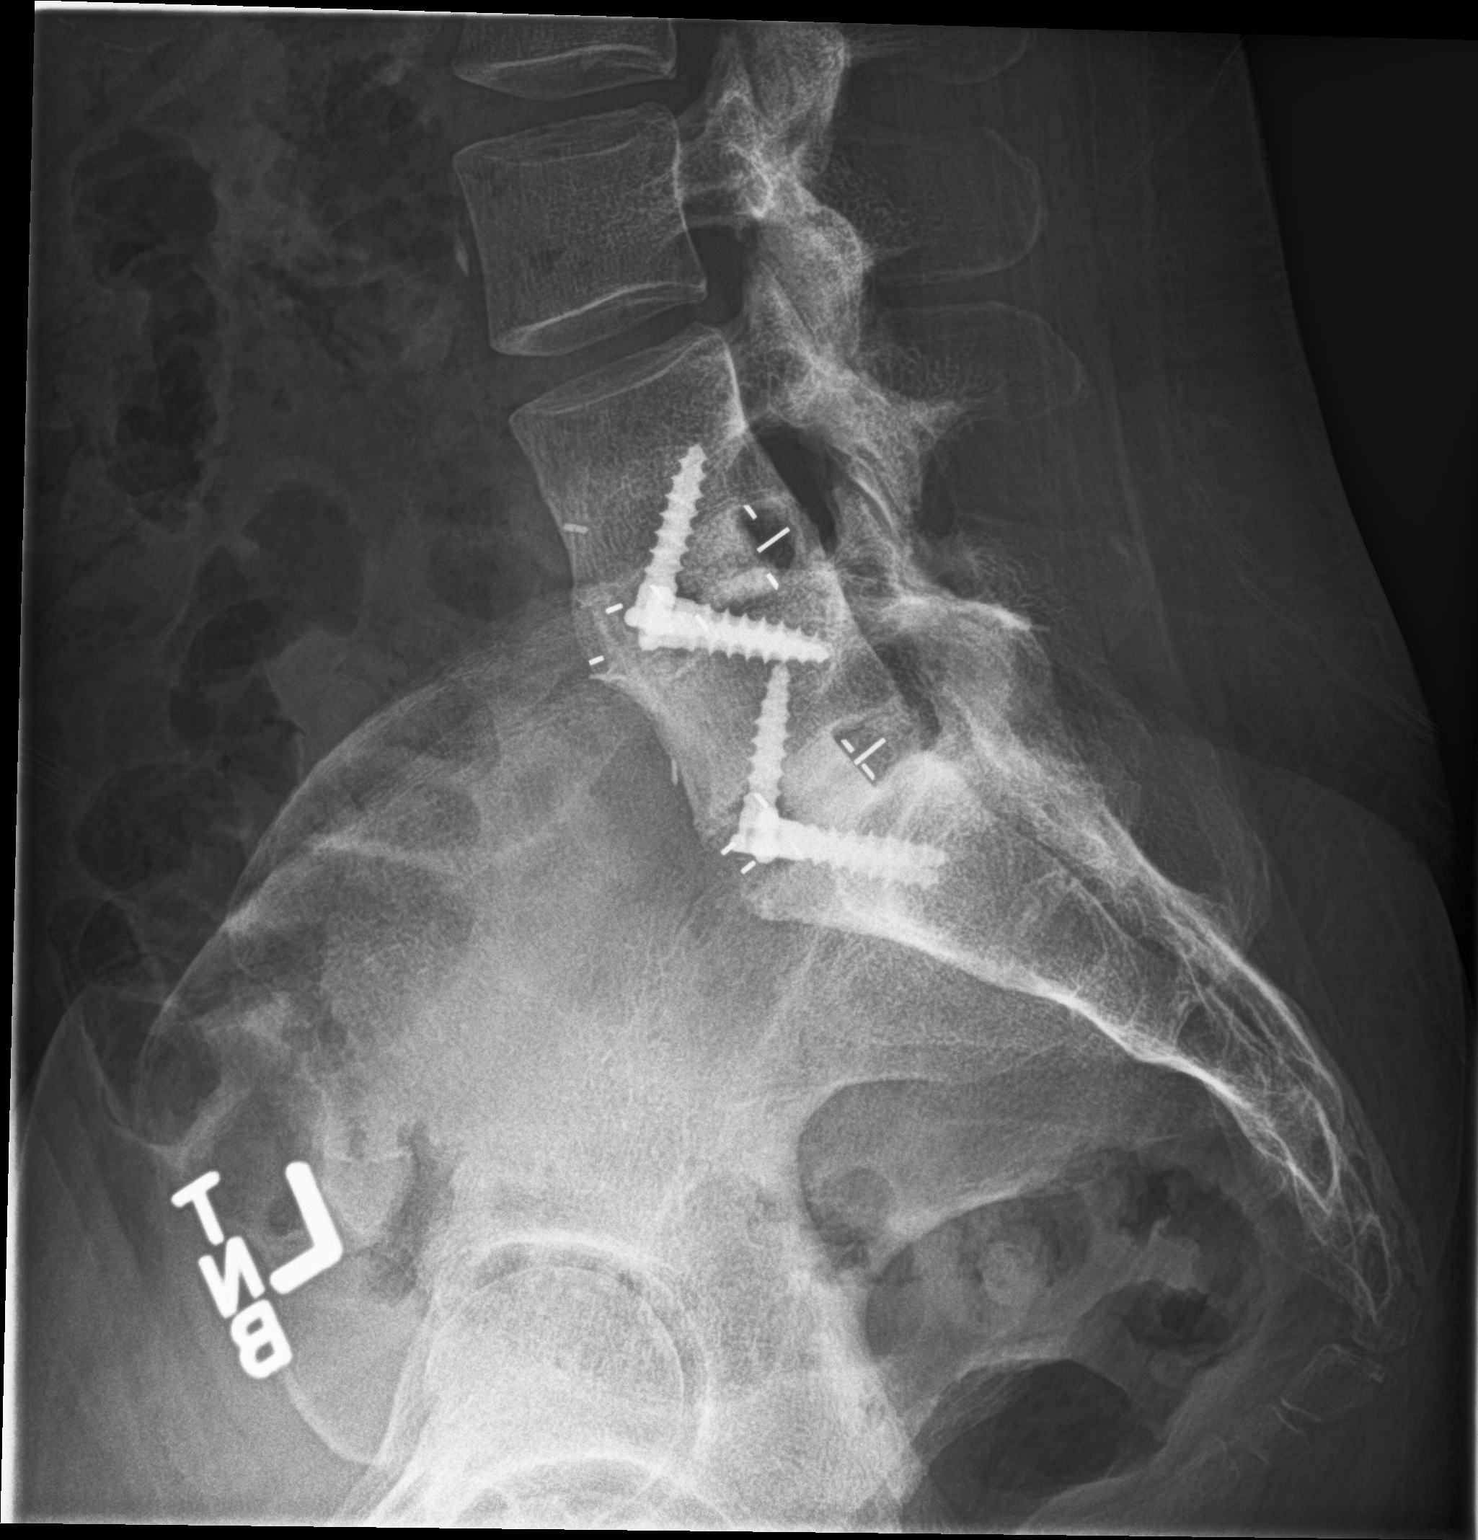

[3 of 3 positions shown; findings below may reference images not displayed]

FINDINGS: The patient has undergone anterior approach screw fixation at L4-5
and L5-S1 with interbody fusion. The metallic hardware appears
intact. There is stable mild levocurvature centered at L2. The
pedicles of L1 through L4 are intact. The L5 pedicles are not well
demonstrated but this is not new. The L1-2 and L2-3 and L3-4 disc
space heights are well maintained. There is no compression fracture
or spondylolisthesis. There is mild facet joint hypertrophy at L4-5
and L5-S1.
IMPRESSION: There are chronic postsurgical changes at L4-5 and L5-S1 without
evidence of hardware failure. There is no compression fracture nor
other acute bony abnormality of the lumbar spine.

## 2017-03-12 DIAGNOSIS — Z79899 Other long term (current) drug therapy: Secondary | ICD-10-CM | POA: Diagnosis not present

## 2017-03-12 DIAGNOSIS — G43009 Migraine without aura, not intractable, without status migrainosus: Secondary | ICD-10-CM | POA: Diagnosis not present

## 2017-03-12 DIAGNOSIS — I509 Heart failure, unspecified: Secondary | ICD-10-CM | POA: Diagnosis not present

## 2017-03-17 DIAGNOSIS — S63502D Unspecified sprain of left wrist, subsequent encounter: Secondary | ICD-10-CM | POA: Diagnosis not present

## 2017-03-19 DIAGNOSIS — D510 Vitamin B12 deficiency anemia due to intrinsic factor deficiency: Secondary | ICD-10-CM | POA: Diagnosis not present

## 2017-03-21 DIAGNOSIS — M25532 Pain in left wrist: Secondary | ICD-10-CM | POA: Diagnosis not present

## 2017-03-21 DIAGNOSIS — M779 Enthesopathy, unspecified: Secondary | ICD-10-CM | POA: Diagnosis not present

## 2017-03-28 DIAGNOSIS — S63502D Unspecified sprain of left wrist, subsequent encounter: Secondary | ICD-10-CM | POA: Diagnosis not present

## 2017-04-07 DIAGNOSIS — S63502D Unspecified sprain of left wrist, subsequent encounter: Secondary | ICD-10-CM | POA: Diagnosis not present

## 2017-04-08 DIAGNOSIS — E039 Hypothyroidism, unspecified: Secondary | ICD-10-CM | POA: Diagnosis not present

## 2017-04-08 DIAGNOSIS — N3281 Overactive bladder: Secondary | ICD-10-CM | POA: Diagnosis not present

## 2017-04-08 DIAGNOSIS — H6690 Otitis media, unspecified, unspecified ear: Secondary | ICD-10-CM | POA: Diagnosis not present

## 2017-04-08 DIAGNOSIS — R062 Wheezing: Secondary | ICD-10-CM | POA: Diagnosis not present

## 2017-08-29 DIAGNOSIS — L821 Other seborrheic keratosis: Secondary | ICD-10-CM | POA: Diagnosis not present

## 2017-08-29 DIAGNOSIS — E538 Deficiency of other specified B group vitamins: Secondary | ICD-10-CM | POA: Diagnosis not present

## 2017-08-29 DIAGNOSIS — G894 Chronic pain syndrome: Secondary | ICD-10-CM | POA: Diagnosis not present

## 2017-08-29 DIAGNOSIS — Z1389 Encounter for screening for other disorder: Secondary | ICD-10-CM | POA: Diagnosis not present

## 2017-09-29 DIAGNOSIS — G43909 Migraine, unspecified, not intractable, without status migrainosus: Secondary | ICD-10-CM | POA: Diagnosis not present

## 2017-09-30 DIAGNOSIS — I509 Heart failure, unspecified: Secondary | ICD-10-CM | POA: Diagnosis not present

## 2017-09-30 DIAGNOSIS — Z79899 Other long term (current) drug therapy: Secondary | ICD-10-CM | POA: Diagnosis not present

## 2017-09-30 DIAGNOSIS — G43909 Migraine, unspecified, not intractable, without status migrainosus: Secondary | ICD-10-CM | POA: Diagnosis not present

## 2017-10-15 DIAGNOSIS — G894 Chronic pain syndrome: Secondary | ICD-10-CM | POA: Diagnosis not present

## 2017-10-15 DIAGNOSIS — Z79899 Other long term (current) drug therapy: Secondary | ICD-10-CM | POA: Diagnosis not present

## 2017-10-15 DIAGNOSIS — M5090 Cervical disc disorder, unspecified, unspecified cervical region: Secondary | ICD-10-CM | POA: Diagnosis not present

## 2017-10-15 DIAGNOSIS — M519 Unspecified thoracic, thoracolumbar and lumbosacral intervertebral disc disorder: Secondary | ICD-10-CM | POA: Diagnosis not present

## 2017-10-22 DIAGNOSIS — Z1389 Encounter for screening for other disorder: Secondary | ICD-10-CM | POA: Diagnosis not present

## 2017-10-22 DIAGNOSIS — Z0000 Encounter for general adult medical examination without abnormal findings: Secondary | ICD-10-CM | POA: Diagnosis not present

## 2017-10-22 DIAGNOSIS — K219 Gastro-esophageal reflux disease without esophagitis: Secondary | ICD-10-CM | POA: Diagnosis not present

## 2017-10-22 DIAGNOSIS — Z13220 Encounter for screening for lipoid disorders: Secondary | ICD-10-CM | POA: Diagnosis not present

## 2017-10-22 DIAGNOSIS — E063 Autoimmune thyroiditis: Secondary | ICD-10-CM | POA: Diagnosis not present

## 2017-10-22 DIAGNOSIS — M545 Low back pain: Secondary | ICD-10-CM | POA: Diagnosis not present

## 2017-10-22 DIAGNOSIS — Z0001 Encounter for general adult medical examination with abnormal findings: Secondary | ICD-10-CM | POA: Diagnosis not present

## 2017-10-22 DIAGNOSIS — I10 Essential (primary) hypertension: Secondary | ICD-10-CM | POA: Diagnosis not present

## 2017-10-29 DIAGNOSIS — G894 Chronic pain syndrome: Secondary | ICD-10-CM | POA: Diagnosis not present

## 2017-10-29 DIAGNOSIS — M5090 Cervical disc disorder, unspecified, unspecified cervical region: Secondary | ICD-10-CM | POA: Diagnosis not present

## 2017-10-29 DIAGNOSIS — M519 Unspecified thoracic, thoracolumbar and lumbosacral intervertebral disc disorder: Secondary | ICD-10-CM | POA: Diagnosis not present

## 2017-11-14 DIAGNOSIS — Z79899 Other long term (current) drug therapy: Secondary | ICD-10-CM | POA: Diagnosis not present

## 2017-11-14 DIAGNOSIS — M519 Unspecified thoracic, thoracolumbar and lumbosacral intervertebral disc disorder: Secondary | ICD-10-CM | POA: Diagnosis not present

## 2017-11-14 DIAGNOSIS — M549 Dorsalgia, unspecified: Secondary | ICD-10-CM | POA: Diagnosis not present

## 2017-11-14 DIAGNOSIS — M5090 Cervical disc disorder, unspecified, unspecified cervical region: Secondary | ICD-10-CM | POA: Diagnosis not present

## 2017-11-14 DIAGNOSIS — G894 Chronic pain syndrome: Secondary | ICD-10-CM | POA: Diagnosis not present

## 2017-12-02 DIAGNOSIS — G894 Chronic pain syndrome: Secondary | ICD-10-CM | POA: Diagnosis not present

## 2017-12-02 DIAGNOSIS — M5090 Cervical disc disorder, unspecified, unspecified cervical region: Secondary | ICD-10-CM | POA: Diagnosis not present

## 2017-12-02 DIAGNOSIS — Z79899 Other long term (current) drug therapy: Secondary | ICD-10-CM | POA: Diagnosis not present

## 2017-12-02 DIAGNOSIS — M519 Unspecified thoracic, thoracolumbar and lumbosacral intervertebral disc disorder: Secondary | ICD-10-CM | POA: Diagnosis not present

## 2017-12-17 DIAGNOSIS — G5603 Carpal tunnel syndrome, bilateral upper limbs: Secondary | ICD-10-CM | POA: Diagnosis not present

## 2017-12-17 DIAGNOSIS — Z1389 Encounter for screening for other disorder: Secondary | ICD-10-CM | POA: Diagnosis not present

## 2017-12-17 DIAGNOSIS — M79645 Pain in left finger(s): Secondary | ICD-10-CM | POA: Diagnosis not present

## 2017-12-17 DIAGNOSIS — R202 Paresthesia of skin: Secondary | ICD-10-CM | POA: Diagnosis not present

## 2017-12-30 DIAGNOSIS — M519 Unspecified thoracic, thoracolumbar and lumbosacral intervertebral disc disorder: Secondary | ICD-10-CM | POA: Diagnosis not present

## 2017-12-30 DIAGNOSIS — Z79899 Other long term (current) drug therapy: Secondary | ICD-10-CM | POA: Diagnosis not present

## 2017-12-30 DIAGNOSIS — G894 Chronic pain syndrome: Secondary | ICD-10-CM | POA: Diagnosis not present

## 2018-01-27 DIAGNOSIS — Z79899 Other long term (current) drug therapy: Secondary | ICD-10-CM | POA: Diagnosis not present

## 2018-01-27 DIAGNOSIS — M519 Unspecified thoracic, thoracolumbar and lumbosacral intervertebral disc disorder: Secondary | ICD-10-CM | POA: Diagnosis not present

## 2018-01-27 DIAGNOSIS — G894 Chronic pain syndrome: Secondary | ICD-10-CM | POA: Diagnosis not present

## 2018-01-27 DIAGNOSIS — Z23 Encounter for immunization: Secondary | ICD-10-CM | POA: Diagnosis not present

## 2018-02-25 DIAGNOSIS — Z79899 Other long term (current) drug therapy: Secondary | ICD-10-CM | POA: Diagnosis not present

## 2018-02-25 DIAGNOSIS — G894 Chronic pain syndrome: Secondary | ICD-10-CM | POA: Diagnosis not present

## 2018-02-25 DIAGNOSIS — M519 Unspecified thoracic, thoracolumbar and lumbosacral intervertebral disc disorder: Secondary | ICD-10-CM | POA: Diagnosis not present

## 2018-04-02 ENCOUNTER — Other Ambulatory Visit (HOSPITAL_COMMUNITY): Payer: Self-pay | Admitting: Internal Medicine

## 2018-04-02 DIAGNOSIS — Z1231 Encounter for screening mammogram for malignant neoplasm of breast: Secondary | ICD-10-CM

## 2018-04-09 ENCOUNTER — Ambulatory Visit (HOSPITAL_COMMUNITY): Payer: Self-pay

## 2018-05-19 DIAGNOSIS — K148 Other diseases of tongue: Secondary | ICD-10-CM | POA: Diagnosis not present

## 2018-05-19 DIAGNOSIS — Z1389 Encounter for screening for other disorder: Secondary | ICD-10-CM | POA: Diagnosis not present

## 2018-08-12 DIAGNOSIS — Z0001 Encounter for general adult medical examination with abnormal findings: Secondary | ICD-10-CM | POA: Diagnosis not present

## 2018-08-12 DIAGNOSIS — R109 Unspecified abdominal pain: Secondary | ICD-10-CM | POA: Diagnosis not present

## 2018-08-12 DIAGNOSIS — K219 Gastro-esophageal reflux disease without esophagitis: Secondary | ICD-10-CM | POA: Diagnosis not present

## 2018-08-12 DIAGNOSIS — I10 Essential (primary) hypertension: Secondary | ICD-10-CM | POA: Diagnosis not present

## 2018-08-12 DIAGNOSIS — E063 Autoimmune thyroiditis: Secondary | ICD-10-CM | POA: Diagnosis not present

## 2018-08-12 DIAGNOSIS — Z1389 Encounter for screening for other disorder: Secondary | ICD-10-CM | POA: Diagnosis not present

## 2018-08-12 DIAGNOSIS — N200 Calculus of kidney: Secondary | ICD-10-CM | POA: Diagnosis not present

## 2018-08-13 ENCOUNTER — Other Ambulatory Visit: Payer: Self-pay | Admitting: Internal Medicine

## 2018-08-13 ENCOUNTER — Other Ambulatory Visit (HOSPITAL_COMMUNITY): Payer: Self-pay | Admitting: Internal Medicine

## 2018-08-13 DIAGNOSIS — R109 Unspecified abdominal pain: Secondary | ICD-10-CM

## 2018-08-18 ENCOUNTER — Ambulatory Visit (HOSPITAL_COMMUNITY): Payer: Medicare Other

## 2018-08-18 ENCOUNTER — Encounter (HOSPITAL_COMMUNITY): Payer: Self-pay

## 2018-08-25 DIAGNOSIS — Z719 Counseling, unspecified: Secondary | ICD-10-CM | POA: Diagnosis not present

## 2018-08-25 DIAGNOSIS — N6320 Unspecified lump in the left breast, unspecified quadrant: Secondary | ICD-10-CM | POA: Diagnosis not present

## 2018-08-25 DIAGNOSIS — N644 Mastodynia: Secondary | ICD-10-CM | POA: Diagnosis not present

## 2018-09-02 DIAGNOSIS — N6002 Solitary cyst of left breast: Secondary | ICD-10-CM | POA: Diagnosis not present

## 2018-09-02 DIAGNOSIS — R922 Inconclusive mammogram: Secondary | ICD-10-CM | POA: Diagnosis not present

## 2018-09-02 DIAGNOSIS — N6322 Unspecified lump in the left breast, upper inner quadrant: Secondary | ICD-10-CM | POA: Diagnosis not present

## 2018-09-02 DIAGNOSIS — N6001 Solitary cyst of right breast: Secondary | ICD-10-CM | POA: Diagnosis not present

## 2018-09-02 DIAGNOSIS — N644 Mastodynia: Secondary | ICD-10-CM | POA: Diagnosis not present

## 2018-09-02 DIAGNOSIS — N6324 Unspecified lump in the left breast, lower inner quadrant: Secondary | ICD-10-CM | POA: Diagnosis not present

## 2018-09-03 ENCOUNTER — Ambulatory Visit (HOSPITAL_COMMUNITY): Payer: Medicare Other

## 2018-09-04 ENCOUNTER — Other Ambulatory Visit: Payer: Self-pay

## 2018-09-04 ENCOUNTER — Ambulatory Visit (HOSPITAL_COMMUNITY)
Admission: RE | Admit: 2018-09-04 | Discharge: 2018-09-04 | Disposition: A | Discharge status: Home / Self Care | Payer: Medicare Other | Source: Ambulatory Visit | Attending: Internal Medicine | Admitting: Internal Medicine

## 2018-09-04 DIAGNOSIS — R109 Unspecified abdominal pain: Secondary | ICD-10-CM | POA: Diagnosis not present

## 2018-09-04 DIAGNOSIS — K760 Fatty (change of) liver, not elsewhere classified: Secondary | ICD-10-CM | POA: Diagnosis not present

## 2018-10-20 DIAGNOSIS — S0990XA Unspecified injury of head, initial encounter: Secondary | ICD-10-CM | POA: Diagnosis not present

## 2018-10-20 DIAGNOSIS — M5489 Other dorsalgia: Secondary | ICD-10-CM | POA: Diagnosis not present

## 2018-10-20 DIAGNOSIS — S5001XA Contusion of right elbow, initial encounter: Secondary | ICD-10-CM | POA: Diagnosis not present

## 2018-10-20 DIAGNOSIS — S2220XA Unspecified fracture of sternum, initial encounter for closed fracture: Secondary | ICD-10-CM | POA: Diagnosis not present

## 2018-10-20 DIAGNOSIS — Z79899 Other long term (current) drug therapy: Secondary | ICD-10-CM | POA: Diagnosis not present

## 2018-10-20 DIAGNOSIS — I509 Heart failure, unspecified: Secondary | ICD-10-CM | POA: Diagnosis not present

## 2018-10-20 DIAGNOSIS — S2222XA Fracture of body of sternum, initial encounter for closed fracture: Secondary | ICD-10-CM | POA: Diagnosis not present

## 2018-10-20 DIAGNOSIS — R109 Unspecified abdominal pain: Secondary | ICD-10-CM | POA: Diagnosis not present

## 2018-10-20 DIAGNOSIS — Y92410 Unspecified street and highway as the place of occurrence of the external cause: Secondary | ICD-10-CM | POA: Diagnosis not present

## 2018-10-20 DIAGNOSIS — R079 Chest pain, unspecified: Secondary | ICD-10-CM | POA: Diagnosis not present

## 2018-10-20 DIAGNOSIS — Z7409 Other reduced mobility: Secondary | ICD-10-CM | POA: Diagnosis not present

## 2018-10-20 DIAGNOSIS — R9431 Abnormal electrocardiogram [ECG] [EKG]: Secondary | ICD-10-CM | POA: Diagnosis not present

## 2018-10-20 DIAGNOSIS — Z981 Arthrodesis status: Secondary | ICD-10-CM | POA: Diagnosis not present

## 2018-10-20 DIAGNOSIS — M1612 Unilateral primary osteoarthritis, left hip: Secondary | ICD-10-CM | POA: Diagnosis not present

## 2018-10-20 DIAGNOSIS — Y999 Unspecified external cause status: Secondary | ICD-10-CM | POA: Diagnosis not present

## 2018-10-20 DIAGNOSIS — S199XXA Unspecified injury of neck, initial encounter: Secondary | ICD-10-CM | POA: Diagnosis not present

## 2018-10-20 DIAGNOSIS — S3991XA Unspecified injury of abdomen, initial encounter: Secondary | ICD-10-CM | POA: Diagnosis not present

## 2018-10-20 DIAGNOSIS — M542 Cervicalgia: Secondary | ICD-10-CM | POA: Diagnosis not present

## 2018-10-20 DIAGNOSIS — R55 Syncope and collapse: Secondary | ICD-10-CM | POA: Diagnosis not present

## 2018-10-20 DIAGNOSIS — M25552 Pain in left hip: Secondary | ICD-10-CM | POA: Diagnosis not present

## 2018-10-20 DIAGNOSIS — R000 Tachycardia, unspecified: Secondary | ICD-10-CM | POA: Diagnosis not present

## 2018-10-20 DIAGNOSIS — S3993XA Unspecified injury of pelvis, initial encounter: Secondary | ICD-10-CM | POA: Diagnosis not present

## 2018-10-21 DIAGNOSIS — R079 Chest pain, unspecified: Secondary | ICD-10-CM | POA: Diagnosis not present

## 2018-10-21 DIAGNOSIS — S2220XA Unspecified fracture of sternum, initial encounter for closed fracture: Secondary | ICD-10-CM | POA: Diagnosis not present

## 2018-11-04 DIAGNOSIS — S2220XD Unspecified fracture of sternum, subsequent encounter for fracture with routine healing: Secondary | ICD-10-CM | POA: Diagnosis not present

## 2018-12-21 DIAGNOSIS — N200 Calculus of kidney: Secondary | ICD-10-CM | POA: Diagnosis not present

## 2018-12-21 DIAGNOSIS — G4700 Insomnia, unspecified: Secondary | ICD-10-CM | POA: Diagnosis not present

## 2018-12-21 DIAGNOSIS — M810 Age-related osteoporosis without current pathological fracture: Secondary | ICD-10-CM | POA: Diagnosis not present

## 2018-12-21 DIAGNOSIS — G894 Chronic pain syndrome: Secondary | ICD-10-CM | POA: Diagnosis not present

## 2019-01-20 DIAGNOSIS — M47812 Spondylosis without myelopathy or radiculopathy, cervical region: Secondary | ICD-10-CM | POA: Diagnosis not present

## 2019-01-20 DIAGNOSIS — G5621 Lesion of ulnar nerve, right upper limb: Secondary | ICD-10-CM | POA: Diagnosis not present

## 2019-01-20 DIAGNOSIS — G5622 Lesion of ulnar nerve, left upper limb: Secondary | ICD-10-CM | POA: Diagnosis not present

## 2019-01-20 DIAGNOSIS — G5603 Carpal tunnel syndrome, bilateral upper limbs: Secondary | ICD-10-CM | POA: Diagnosis not present

## 2019-02-01 IMAGING — MG 2D DIGITAL SCREENING BILATERAL MAMMOGRAM WITH CAD AND ADJUNCT TO
3 series · 3 of 7 positions shown · non-contrast
Comparison: Previous exam(s).

CLINICAL DATA: Screening.

EXAM:
2D DIGITAL SCREENING BILATERAL MAMMOGRAM WITH CAD AND ADJUNCT TOMO

[R MLO]
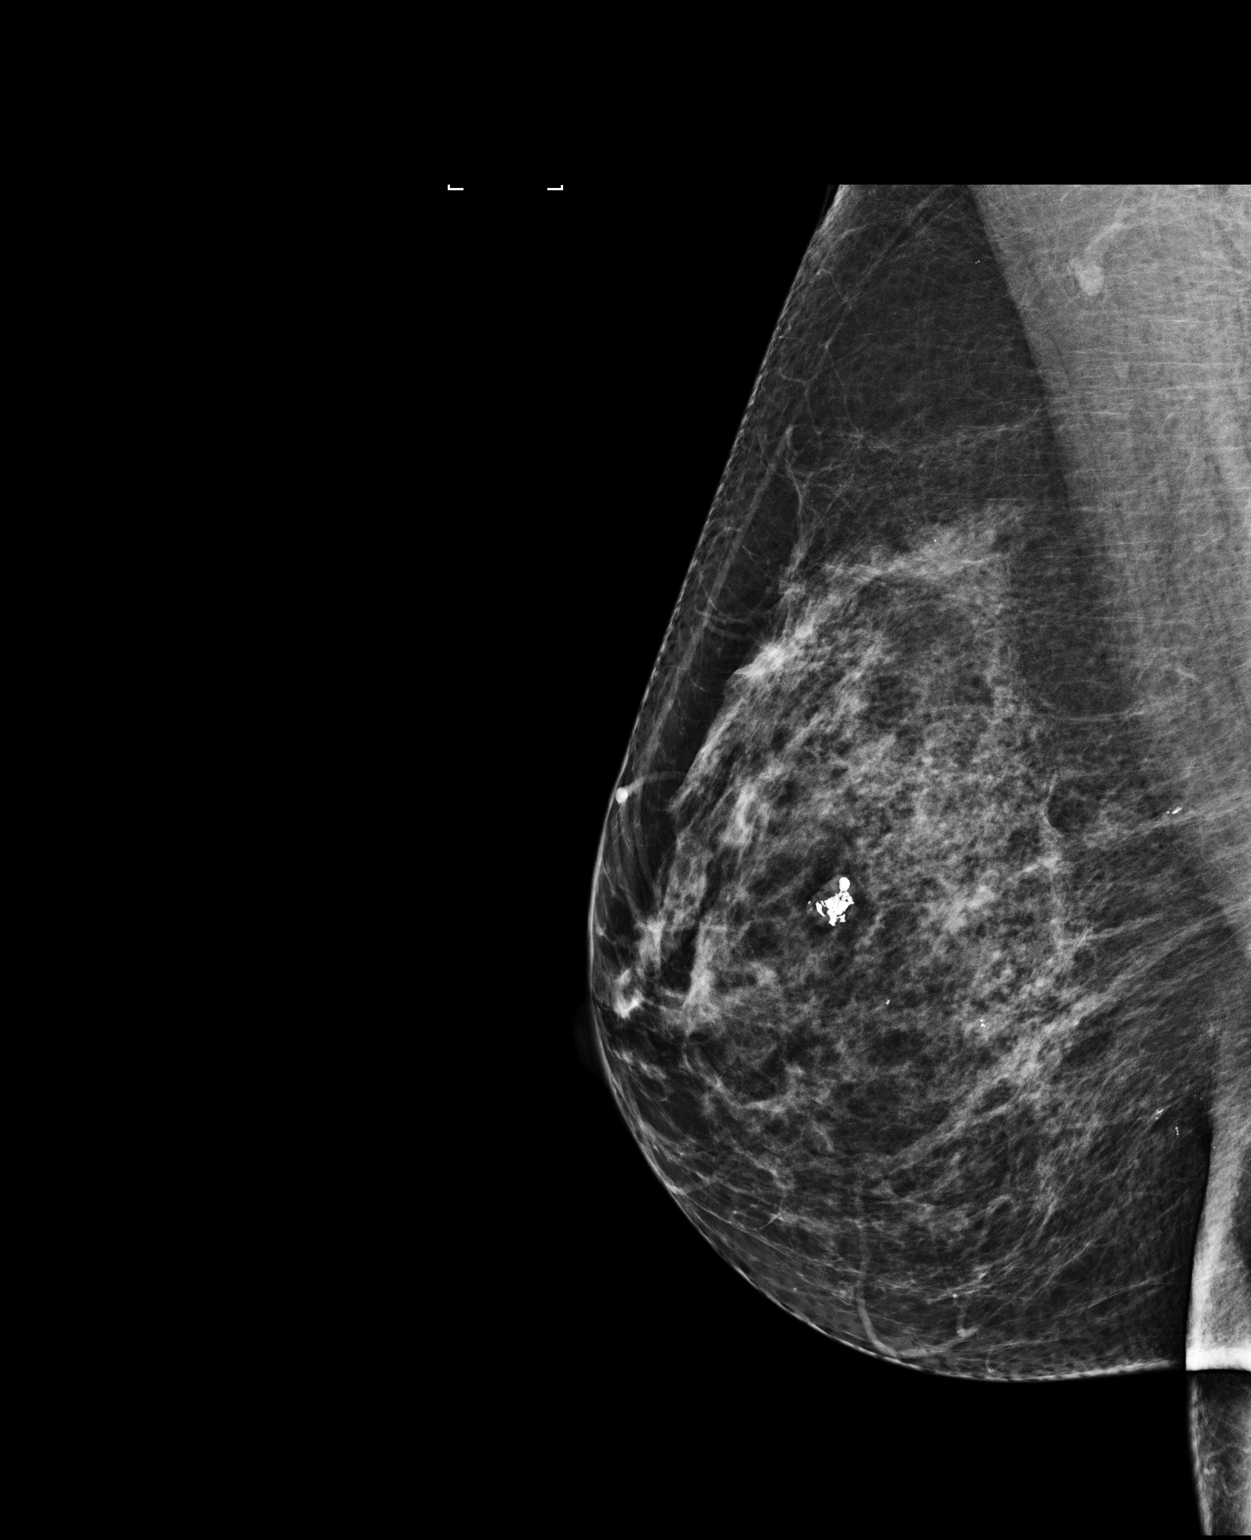

[L MLO]
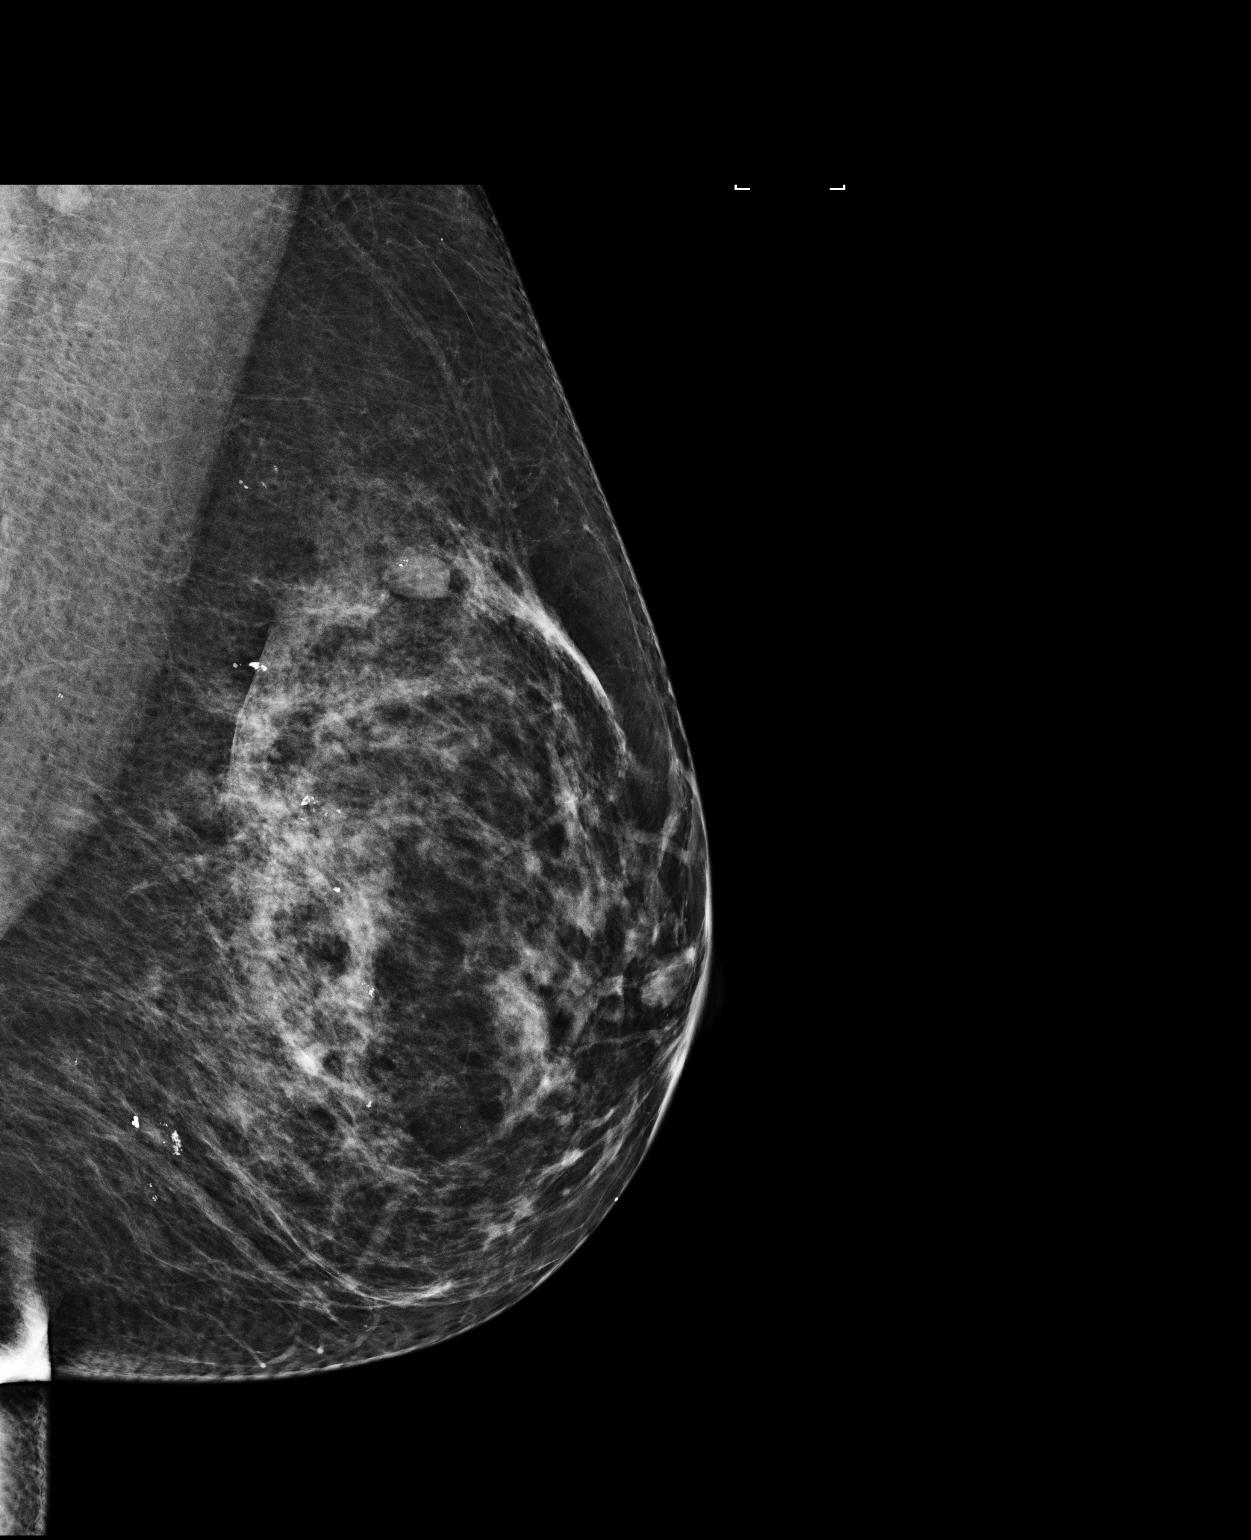

[R CC tomo · tomo slice 31/61.0]
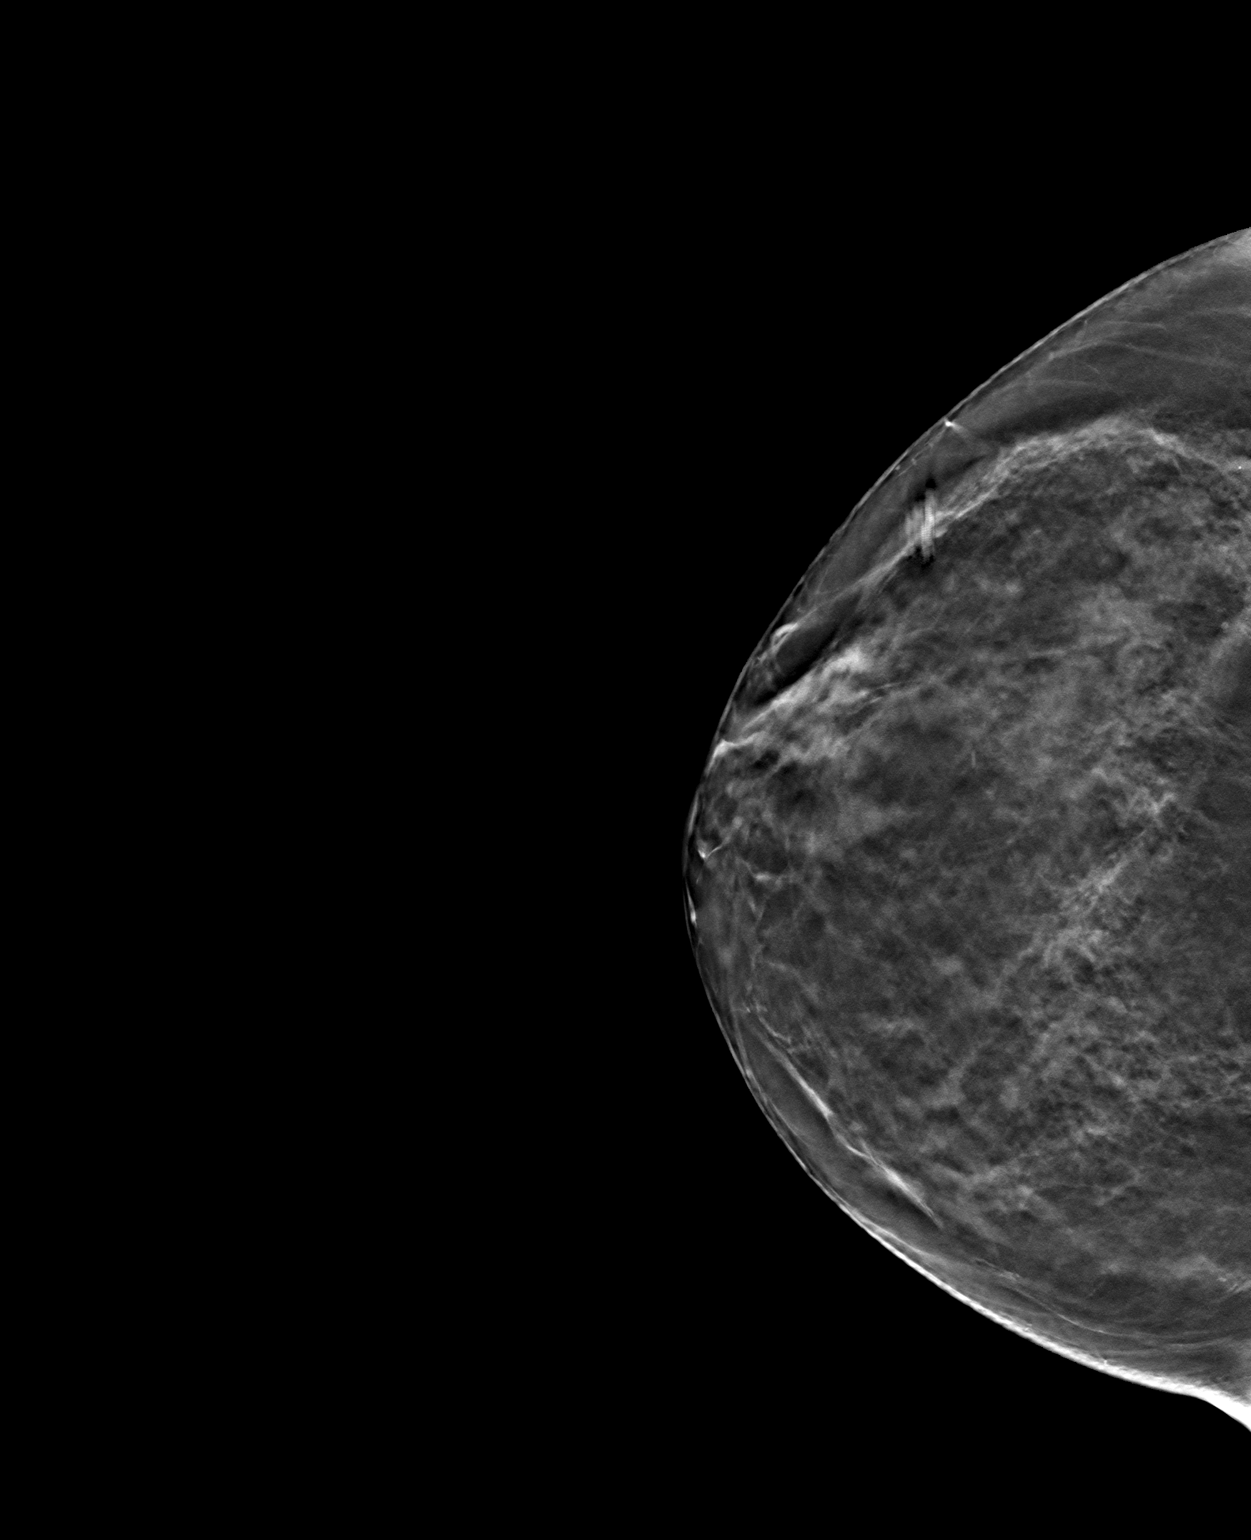

[3 of 7 positions shown; findings below may reference images not displayed]

ACR Breast Density Category c: The breast tissue is heterogeneously
dense, which may obscure small masses.
FINDINGS: There are no findings suspicious for malignancy. Images were
processed with CAD.
IMPRESSION: No mammographic evidence of malignancy. A result letter of this
screening mammogram will be mailed directly to the patient.

RECOMMENDATION:
Screening mammogram in one year. (Code:[id number]-[id number])

BI-RADS CATEGORY  1: Negative.

## 2019-03-03 DIAGNOSIS — G5603 Carpal tunnel syndrome, bilateral upper limbs: Secondary | ICD-10-CM | POA: Diagnosis not present

## 2019-03-04 ENCOUNTER — Other Ambulatory Visit: Payer: Self-pay | Admitting: Orthopedic Surgery

## 2019-03-16 ENCOUNTER — Encounter (HOSPITAL_BASED_OUTPATIENT_CLINIC_OR_DEPARTMENT_OTHER): Payer: Self-pay | Admitting: Orthopedic Surgery

## 2019-03-16 ENCOUNTER — Other Ambulatory Visit: Payer: Self-pay

## 2019-03-18 ENCOUNTER — Other Ambulatory Visit (HOSPITAL_COMMUNITY)
Admission: RE | Admit: 2019-03-18 | Discharge: 2019-03-18 | Disposition: A | Discharge status: Home / Self Care | Payer: Medicare Other | Source: Ambulatory Visit | Attending: Orthopedic Surgery | Admitting: Orthopedic Surgery

## 2019-03-18 ENCOUNTER — Other Ambulatory Visit: Payer: Self-pay

## 2019-03-18 DIAGNOSIS — E039 Hypothyroidism, unspecified: Secondary | ICD-10-CM | POA: Diagnosis not present

## 2019-03-18 DIAGNOSIS — Z01812 Encounter for preprocedural laboratory examination: Secondary | ICD-10-CM | POA: Diagnosis not present

## 2019-03-18 DIAGNOSIS — Z20828 Contact with and (suspected) exposure to other viral communicable diseases: Secondary | ICD-10-CM | POA: Insufficient documentation

## 2019-03-19 LAB — SARS CORONAVIRUS 2 (TAT 6-24 HRS): SARS Coronavirus 2: NEGATIVE

## 2019-03-22 ENCOUNTER — Encounter (HOSPITAL_BASED_OUTPATIENT_CLINIC_OR_DEPARTMENT_OTHER)
Admission: RE | Disposition: A | Discharge status: Home / Self Care | Payer: Self-pay | Source: Home / Self Care | Attending: Orthopedic Surgery

## 2019-03-22 ENCOUNTER — Encounter (HOSPITAL_BASED_OUTPATIENT_CLINIC_OR_DEPARTMENT_OTHER): Payer: Self-pay | Admitting: Orthopedic Surgery

## 2019-03-22 ENCOUNTER — Ambulatory Visit (HOSPITAL_BASED_OUTPATIENT_CLINIC_OR_DEPARTMENT_OTHER): Payer: Medicare Other | Admitting: Anesthesiology

## 2019-03-22 ENCOUNTER — Ambulatory Visit (HOSPITAL_BASED_OUTPATIENT_CLINIC_OR_DEPARTMENT_OTHER)
Admission: RE | Admit: 2019-03-22 | Discharge: 2019-03-22 | Disposition: A | Discharge status: Home / Self Care | Payer: Medicare Other | Attending: Orthopedic Surgery | Admitting: Orthopedic Surgery

## 2019-03-22 ENCOUNTER — Other Ambulatory Visit: Payer: Self-pay

## 2019-03-22 DIAGNOSIS — G43909 Migraine, unspecified, not intractable, without status migrainosus: Secondary | ICD-10-CM | POA: Insufficient documentation

## 2019-03-22 DIAGNOSIS — E7800 Pure hypercholesterolemia, unspecified: Secondary | ICD-10-CM | POA: Insufficient documentation

## 2019-03-22 DIAGNOSIS — M542 Cervicalgia: Secondary | ICD-10-CM | POA: Diagnosis not present

## 2019-03-22 DIAGNOSIS — Z90721 Acquired absence of ovaries, unilateral: Secondary | ICD-10-CM | POA: Diagnosis not present

## 2019-03-22 DIAGNOSIS — F419 Anxiety disorder, unspecified: Secondary | ICD-10-CM | POA: Diagnosis not present

## 2019-03-22 DIAGNOSIS — G5601 Carpal tunnel syndrome, right upper limb: Secondary | ICD-10-CM | POA: Diagnosis not present

## 2019-03-22 DIAGNOSIS — M549 Dorsalgia, unspecified: Secondary | ICD-10-CM | POA: Diagnosis not present

## 2019-03-22 DIAGNOSIS — E890 Postprocedural hypothyroidism: Secondary | ICD-10-CM | POA: Insufficient documentation

## 2019-03-22 DIAGNOSIS — K219 Gastro-esophageal reflux disease without esophagitis: Secondary | ICD-10-CM | POA: Insufficient documentation

## 2019-03-22 DIAGNOSIS — G9050 Complex regional pain syndrome I, unspecified: Secondary | ICD-10-CM | POA: Insufficient documentation

## 2019-03-22 DIAGNOSIS — Z90710 Acquired absence of both cervix and uterus: Secondary | ICD-10-CM | POA: Insufficient documentation

## 2019-03-22 DIAGNOSIS — F17210 Nicotine dependence, cigarettes, uncomplicated: Secondary | ICD-10-CM | POA: Diagnosis not present

## 2019-03-22 DIAGNOSIS — Z79899 Other long term (current) drug therapy: Secondary | ICD-10-CM | POA: Insufficient documentation

## 2019-03-22 DIAGNOSIS — E039 Hypothyroidism, unspecified: Secondary | ICD-10-CM | POA: Diagnosis not present

## 2019-03-22 HISTORY — DX: Other complications of anesthesia, initial encounter: T88.59XA

## 2019-03-22 HISTORY — PX: CARPAL TUNNEL RELEASE: SHX101

## 2019-03-22 HISTORY — DX: Hypothyroidism, unspecified: E03.9

## 2019-03-22 SURGERY — CARPAL TUNNEL RELEASE
Anesthesia: General | Site: Hand | Laterality: Right

## 2019-03-22 MED ORDER — CEFAZOLIN SODIUM-DEXTROSE 2-4 GM/100ML-% IV SOLN
INTRAVENOUS | Status: AC
  Filled 2019-03-22: qty 100

## 2019-03-22 MED ORDER — CELECOXIB 200 MG PO CAPS
ORAL_CAPSULE | ORAL | Status: AC
  Filled 2019-03-22: qty 2

## 2019-03-22 MED ORDER — ONDANSETRON HCL 4 MG/2ML IJ SOLN
INTRAMUSCULAR | Status: DC | PRN
  Administered 2019-03-22: 4 mg via INTRAVENOUS

## 2019-03-22 MED ORDER — FENTANYL CITRATE (PF) 100 MCG/2ML IJ SOLN
INTRAMUSCULAR | Status: DC | PRN
  Administered 2019-03-22: 25 ug via INTRAVENOUS
  Administered 2019-03-22: 50 ug via INTRAVENOUS
  Administered 2019-03-22: 25 ug via INTRAVENOUS

## 2019-03-22 MED ORDER — DEXAMETHASONE SODIUM PHOSPHATE 10 MG/ML IJ SOLN
INTRAMUSCULAR | Status: AC
  Filled 2019-03-22: qty 1

## 2019-03-22 MED ORDER — ONDANSETRON HCL 4 MG/2ML IJ SOLN
INTRAMUSCULAR | Status: AC
  Filled 2019-03-22: qty 2

## 2019-03-22 MED ORDER — CHLORHEXIDINE GLUCONATE 4 % EX LIQD: 60.0000 mL | Freq: Once | CUTANEOUS | Status: DC

## 2019-03-22 MED ORDER — MIDAZOLAM HCL 2 MG/2ML IJ SOLN
INTRAMUSCULAR | Status: AC
  Filled 2019-03-22: qty 2

## 2019-03-22 MED ORDER — ACETAMINOPHEN 500 MG PO TABS
1000.0000 mg | ORAL_TABLET | Freq: Once | ORAL | Status: AC
  Administered 2019-03-22: 11:00:00 1000 mg via ORAL

## 2019-03-22 MED ORDER — PROPOFOL 10 MG/ML IV BOLUS
INTRAVENOUS | Status: DC | PRN
  Administered 2019-03-22 (×3): 20 ug via INTRAVENOUS
  Administered 2019-03-22: 40 ug via INTRAVENOUS

## 2019-03-22 MED ORDER — MIDAZOLAM HCL 2 MG/2ML IJ SOLN
INTRAMUSCULAR | Status: DC | PRN
  Administered 2019-03-22: 2 mg via INTRAVENOUS

## 2019-03-22 MED ORDER — ACETAMINOPHEN 500 MG PO TABS
ORAL_TABLET | ORAL | Status: AC
  Filled 2019-03-22: qty 2

## 2019-03-22 MED ORDER — FENTANYL CITRATE (PF) 100 MCG/2ML IJ SOLN
INTRAMUSCULAR | Status: AC
  Filled 2019-03-22: qty 2

## 2019-03-22 MED ORDER — BUPIVACAINE HCL (PF) 0.25 % IJ SOLN
INTRAMUSCULAR | Status: AC
  Filled 2019-03-22: qty 30

## 2019-03-22 MED ORDER — LACTATED RINGERS IV SOLN: INTRAVENOUS | Status: DC

## 2019-03-22 MED ORDER — CELECOXIB 400 MG PO CAPS
400.0000 mg | ORAL_CAPSULE | Freq: Once | ORAL | Status: AC
  Administered 2019-03-22: 11:00:00 400 mg via ORAL

## 2019-03-22 MED ORDER — OXYCODONE-ACETAMINOPHEN 5-325 MG PO TABS: ORAL_TABLET | ORAL | 0 refills | Status: DC

## 2019-03-22 MED ORDER — BUPIVACAINE HCL (PF) 0.25 % IJ SOLN
INTRAMUSCULAR | Status: DC | PRN
  Administered 2019-03-22: 9 mL

## 2019-03-22 MED ORDER — LIDOCAINE HCL (PF) 0.5 % IJ SOLN
INTRAMUSCULAR | Status: DC | PRN
  Administered 2019-03-22: 35 mg via INTRAVENOUS

## 2019-03-22 MED ORDER — LIDOCAINE 2% (20 MG/ML) 5 ML SYRINGE
INTRAMUSCULAR | Status: AC
  Filled 2019-03-22: qty 5

## 2019-03-22 MED ORDER — CEFAZOLIN SODIUM-DEXTROSE 2-4 GM/100ML-% IV SOLN
2.0000 g | INTRAVENOUS | Status: AC
  Administered 2019-03-22: 2 g via INTRAVENOUS

## 2019-03-22 SURGICAL SUPPLY — 34 items
BLADE SURG 15 STRL LF DISP TIS (BLADE) ×2 IMPLANT
BLADE SURG 15 STRL SS (BLADE) ×4
BNDG ELASTIC 3X5.8 VLCR STR LF (GAUZE/BANDAGES/DRESSINGS) ×3 IMPLANT
BNDG ELASTIC 4X5.8 VLCR STR LF (GAUZE/BANDAGES/DRESSINGS) ×3 IMPLANT
BNDG ESMARK 4X9 LF (GAUZE/BANDAGES/DRESSINGS) ×3 IMPLANT
BNDG GAUZE ELAST 4 BULKY (GAUZE/BANDAGES/DRESSINGS) ×3 IMPLANT
CHLORAPREP W/TINT 26 (MISCELLANEOUS) ×3 IMPLANT
CORD BIPOLAR FORCEPS 12FT (ELECTRODE) ×3 IMPLANT
COVER BACK TABLE REUSABLE LG (DRAPES) ×3 IMPLANT
COVER MAYO STAND REUSABLE (DRAPES) ×3 IMPLANT
COVER WAND RF STERILE (DRAPES) IMPLANT
CUFF TOURN SGL QUICK 18X4 (TOURNIQUET CUFF) ×3 IMPLANT
DRAPE EXTREMITY T 121X128X90 (DISPOSABLE) ×3 IMPLANT
DRAPE SURG 17X23 STRL (DRAPES) ×3 IMPLANT
DRSG PAD ABDOMINAL 8X10 ST (GAUZE/BANDAGES/DRESSINGS) ×3 IMPLANT
GAUZE SPONGE 4X4 12PLY STRL (GAUZE/BANDAGES/DRESSINGS) ×3 IMPLANT
GAUZE XEROFORM 1X8 LF (GAUZE/BANDAGES/DRESSINGS) ×3 IMPLANT
GLOVE BIO SURGEON STRL SZ7.5 (GLOVE) ×3 IMPLANT
GLOVE BIOGEL PI IND STRL 8 (GLOVE) ×1 IMPLANT
GLOVE BIOGEL PI INDICATOR 8 (GLOVE) ×2
GOWN STRL REUS W/ TWL LRG LVL3 (GOWN DISPOSABLE) ×1 IMPLANT
GOWN STRL REUS W/TWL LRG LVL3 (GOWN DISPOSABLE) ×2
GOWN STRL REUS W/TWL XL LVL3 (GOWN DISPOSABLE) ×3 IMPLANT
NEEDLE HYPO 25X1 1.5 SAFETY (NEEDLE) ×3 IMPLANT
NS IRRIG 1000ML POUR BTL (IV SOLUTION) ×3 IMPLANT
PACK BASIN DAY SURGERY FS (CUSTOM PROCEDURE TRAY) ×3 IMPLANT
PADDING CAST ABS 4INX4YD NS (CAST SUPPLIES) ×2
PADDING CAST ABS COTTON 4X4 ST (CAST SUPPLIES) ×1 IMPLANT
STOCKINETTE 4X48 STRL (DRAPES) ×3 IMPLANT
SUT ETHILON 4 0 PS 2 18 (SUTURE) ×3 IMPLANT
SYR BULB 3OZ (MISCELLANEOUS) ×3 IMPLANT
SYR CONTROL 10ML LL (SYRINGE) ×3 IMPLANT
TOWEL GREEN STERILE FF (TOWEL DISPOSABLE) ×6 IMPLANT
UNDERPAD 30X36 HEAVY ABSORB (UNDERPADS AND DIAPERS) ×3 IMPLANT

## 2019-03-22 NOTE — Transfer of Care (Signed)
Immediate Anesthesia Transfer of Care Note  Patient: [name]  Procedure(s) Performed: RIGHT CARPAL TUNNEL RELEASE (Right Hand)  Patient Location: PACU  Anesthesia Type:Bier block  Level of Consciousness: awake, oriented and patient cooperative  Airway & Oxygen Therapy: Patient Spontanous Breathing  Post-op Assessment: Report given to RN and Post -op Vital signs reviewed and stable  Post vital signs: Reviewed and stable  Last Vitals:  Vitals Value Taken Time  BP 104/62 03/22/19 1330  Temp    Pulse 73 03/22/19 1332  Resp 12 03/22/19 1332  SpO2 100 % 03/22/19 1332    Last Pain:  Vitals:   03/22/19 1112  TempSrc: Tympanic  PainSc: 6          Complications: No apparent anesthesia complications

## 2019-03-22 NOTE — Anesthesia Postprocedure Evaluation (Signed)
Anesthesia Post Note  Patient: [name]  Procedure(s) Performed: RIGHT CARPAL TUNNEL RELEASE (Right Hand)     Patient location during evaluation: PACU Anesthesia Type: Bier Block and MAC Level of consciousness: awake and alert and oriented Pain management: pain level controlled Vital Signs Assessment: post-procedure vital signs reviewed and stable Respiratory status: spontaneous breathing, nonlabored ventilation and respiratory function stable Cardiovascular status: stable and blood pressure returned to baseline Postop Assessment: no apparent nausea or vomiting Anesthetic complications: no    Last Vitals:  Vitals:   03/22/19 1345 03/22/19 1400  BP: (!) 97/52 98/64  Pulse: 62 73  Resp: 11 12  Temp:    SpO2: 94% 92%    Last Pain:  Vitals:   03/22/19 1415  TempSrc:   PainSc: 0-No pain                 Carden Teel A.

## 2019-03-22 NOTE — Discharge Instructions (Addendum)
[facility] Instructions Hand Surgery  Wound Care: Keep your hand elevated above the level of your heart.  Do not allow it to dangle by your side.  Keep the dressing dry and do not remove it unless your doctor advises you to do so.  He will usually change it at the time of your post-op visit.  Moving your fingers is advised to stimulate circulation but will depend on the site of your surgery.  If you have a splint applied, your doctor will advise you regarding movement.  Activity: Do not drive or operate machinery today.  Rest today and then you may return to your normal activity and work as indicated by your physician.  Diet:  Drink liquids today or eat a light diet.  You may resume a regular diet tomorrow.     No tylenol until after 2pm today.  No ibuprofen until after 4pm today.   Regional Anesthesia Blocks  1. Numbness or the inability to move the "blocked" extremity may last from 3-48 hours after placement. The length of time depends on the medication injected and your individual response to the medication. If the numbness is not going away after 48 hours, call your surgeon.  2. The extremity that is blocked will need to be protected until the numbness is gone and the  Strength has returned. Because you cannot feel it, you will need to take extra care to avoid injury. Because it may be weak, you may have difficulty moving it or using it. You may not know what position it is in without looking at it while the block is in effect.  3. For blocks in the legs and feet, returning to weight bearing and walking needs to be done carefully. You will need to wait until the numbness is entirely gone and the strength has returned. You should be able to move your leg and foot normally before you try and bear weight or walk. You will need someone to be with you when you first try to ensure you do not fall and possibly risk injury.  4. Bruising and tenderness at the needle site are common side effects  and will resolve in a few days.  5. Persistent numbness or new problems with movement should be communicated to the surgeon or the [place] [phone] [place] [phone]).  General expectations: Pain for two to three days. Fingers may become slightly swollen.  Call your doctor if any of the following occur: Severe pain not relieved by pain medication. Elevated temperature. Dressing soaked with blood. Inability to move fingers. White or bluish color to fingers.    Post Anesthesia Home Care Instructions  Activity: Get plenty of rest for the remainder of the day. A responsible individual must stay with you for 24 hours following the procedure.  For the next 24 hours, DO NOT: -Drive a car -Paediatric nurse -Drink alcoholic beverages -Take any medication unless instructed by your physician -Make any legal decisions or sign important papers.  Meals: Start with liquid foods such as gelatin or soup. Progress to regular foods as tolerated. Avoid greasy, spicy, heavy foods. If nausea and/or vomiting occur, drink only clear liquids until the nausea and/or vomiting subsides. Call your physician if vomiting continues.  Special Instructions/Symptoms: Your throat may feel dry or sore from the anesthesia or the breathing tube placed in your throat during surgery. If this causes discomfort, gargle with warm salt water. The discomfort should disappear within 24 hours.  If you  had a scopolamine patch placed behind your ear for the management of post- operative nausea and/or vomiting:  1. The medication in the patch is effective for 72 hours, after which it should be removed.  Wrap patch in a tissue and discard in the trash. Wash hands thoroughly with soap and water. 2. You may remove the patch earlier than 72 hours if you experience unpleasant side effects which may include dry mouth, dizziness or visual disturbances. 3. Avoid touching the patch. Wash your  hands with soap and water after contact with the patch.

## 2019-03-22 NOTE — Anesthesia Procedure Notes (Signed)
Procedure Name: MAC Date/Time: 03/22/2019 12:51 PM Performed by: Renato Shin, CRNA Pre-anesthesia Checklist: Patient identified, Emergency Drugs available, Suction available and Patient being monitored Patient Re-evaluated:Patient Re-evaluated prior to induction Oxygen Delivery Method: Nasal cannula Preoxygenation: Pre-oxygenation with 100% oxygen Induction Type: IV induction Placement Confirmation: positive ETCO2 and breath sounds checked- equal and bilateral Dental Injury: Teeth and Oropharynx as per pre-operative assessment

## 2019-03-22 NOTE — Op Note (Signed)
03/22/2019 [facility]                              OPERATIVE REPORT   PREOPERATIVE DIAGNOSIS:  Right carpal tunnel syndrome.  POSTOPERATIVE DIAGNOSIS:  Right carpal tunnel syndrome.  PROCEDURE:  Right carpal tunnel release.  SURGEON:  Leanora Cover, MD  ASSISTANT:  none.  ANESTHESIA: Bier block with sedation  IV FLUIDS:  Per anesthesia flow sheet.  ESTIMATED BLOOD LOSS:  Minimal.  COMPLICATIONS:  None.  SPECIMENS:  None.  TOURNIQUET TIME:    Total Tourniquet Time Documented: Forearm (Right) - 31 minutes Total: Forearm (Right) - 31 minutes   DISPOSITION:  Stable to PACU.  LOCATION: [facility]  INDICATIONS:  48 yo female with numbness and tingling right hand.  Nocturnal symptoms.  Positive nerve conduction studies.  She wishes to have a carpal tunnel release for management of her symptoms.  Risks, benefits and alternatives of surgery were discussed including the risk of blood loss; infection; damage to nerves, vessels, tendons, ligaments, bone; failure of surgery; need for additional surgery; complications with wound healing; continued pain; recurrence of carpal tunnel syndrome; and damage to motor branch. She voiced understanding of these risks and elected to proceed.   OPERATIVE COURSE:  After being identified preoperatively by myself, the patient and I agreed upon the procedure and site of procedure.  The surgical site was marked.  The risks, benefits, and alternatives of the surgery were reviewed and she wished to proceed.  Surgical consent had been signed.  She was given IV Ancef as preoperative antibiotic prophylaxis.  She was transferred to the operating room and placed on the operating room table in supine position with the Right upper extremity on an armboard.  Bier block anesthesia was induced by the anesthesiologist.  Right upper extremity was prepped and draped in normal sterile orthopaedic fashion.  A surgical pause was performed between  the surgeons, anesthesia, and operating room staff, and all were in agreement as to the patient, procedure, and site of procedure.  Tourniquet at the proximal aspect of the forearm had been inflated for the Bier block  Incision was made over the transverse carpal ligament and carried into the subcutaneous tissues by spreading technique.  Bipolar electrocautery was used to obtain hemostasis.  The palmar fascia was sharply incised.  The transverse carpal ligament was identified and sharply incised.  It was incised distally first.  Care was taken to ensure complete decompression distally.  It was then incised proximally.  Scissors were used to split the distal aspect of the volar antebrachial fascia.  A finger was placed into the wound to ensure complete decompression, which was the case.  The nerve was examined.  It was flattened and hyperemic.  The motor branch was identified and was intact.  The wound was copiously irrigated with sterile saline.  It was then closed with 4-0 nylon in a horizontal mattress fashion.  It was injected with 0.25% plain Marcaine to aid in postoperative analgesia.  It was dressed with sterile Xeroform, 4x4s, an ABD, and wrapped with Kerlix and an Ace bandage.  Tourniquet was deflated at 31 minutes.  Fingertips were pink with brisk capillary refill after deflation of the tourniquet.  Operative drapes were broken down.  The patient was awoken from anesthesia safely.  She was transferred back to stretcher and taken to the PACU in stable condition.  I will see her back in the  office in 1 week for postoperative followup.  I will give her a prescription for Percocet 5/325 1-2 tabs PO q6 hours prn pain, dispense # 20.    Leanora Cover, MD Electronically signed, 03/22/19

## 2019-03-22 NOTE — Anesthesia Preprocedure Evaluation (Addendum)
Anesthesia Evaluation  Patient identified by MRN, date of birth, ID band Patient awake    Reviewed: Allergy & Precautions, NPO status , Patient's Chart, lab work & pertinent test results  History of Anesthesia Complications Negative for: history of anesthetic complications  Airway Mallampati: II  TM Distance: >3 FB Neck ROM: Full    Dental  (+) Poor Dentition, Missing, Dental Advisory Given   Pulmonary Current Smoker and Patient abstained from smoking.,    Pulmonary exam normal        Cardiovascular negative cardio ROS Normal cardiovascular exam     Neuro/Psych  Headaches, PSYCHIATRIC DISORDERS Anxiety    GI/Hepatic Neg liver ROS, GERD  ,  Endo/Other  Hypothyroidism   Renal/GU negative Renal ROS     Musculoskeletal negative musculoskeletal ROS (+)   Abdominal   Peds  Hematology negative hematology ROS (+)   Anesthesia Other Findings Day of surgery medications reviewed with the patient.  Reproductive/Obstetrics                            Anesthesia Physical Anesthesia Plan  ASA: II  Anesthesia Plan: General   Post-op Pain Management:    Induction: Intravenous  PONV Risk Score and Plan: 2 and Ondansetron, Dexamethasone and Midazolam  Airway Management Planned: LMA  Additional Equipment:   Intra-op Plan:   Post-operative Plan: Extubation in OR  Informed Consent: I have reviewed the patients History and Physical, chart, labs and discussed the procedure including the risks, benefits and alternatives for the proposed anesthesia with the patient or authorized representative who has indicated his/her understanding and acceptance.     Dental advisory given  Plan Discussed with: CRNA, Anesthesiologist and Surgeon  Anesthesia Plan Comments:        Anesthesia Quick Evaluation

## 2019-03-22 NOTE — H&P (Signed)
[name] is an 48 y.o. female.   Chief Complaint: right carpal tunnel syndrome HPI: 48 yo with numbness and tingling in right hand.  Nocturnal symptoms.  Positive nerve conduction studies.  She wishes to have a right carpal tunnel release.  Allergies:  Allergies  Allergen Reactions  . Darvocet [Propoxyphene N-Acetaminophen] Hives  . Lyrica [Pregabalin] Swelling    Past Medical History:  Diagnosis Date  . Anxiety   . Chronic back pain   . Chronic neck pain   . Complication of anesthesia    pt states her O2 saturation drops after surgery  . GERD (gastroesophageal reflux disease)   . Headache    migraines- last one 2 months ago  . Hypercholesteremia   . Hypothyroidism   . Reflex sympathetic dystrophy   . Thyroid disease     Past Surgical History:  Procedure Laterality Date  . ABDOMINAL HYSTERECTOMY     partial  . BACK SURGERY     plates and screws  . BREAST SURGERY Left    fibrocystic tumor  . CYST EXCISION Left    foot  . EAR CYST EXCISION Right 01/27/2013   Procedure: EXCISION CYST ON SCALP ;  Surgeon: Jamesetta So, MD;  Location: [facility];  Service: General;  Laterality: Right;  . EAR CYST EXCISION Right 01/27/2013   Procedure: CYST REMOVAL right axilla;  Surgeon: Jamesetta So, MD;  Location: [facility];  Service: General;  Laterality: Right;  . KNEE ARTHROSCOPY Left   . NECK SURGERY     plate in neck  . SALPINGOOPHORECTOMY Right   . STRABISMUS SURGERY Bilateral   . THYROIDECTOMY N/A 03/14/2014   Procedure: TOTAL THYROIDECTOMY;  Surgeon: Jamesetta So, MD;  Location: [facility];  Service: General;  Laterality: N/A;    Family History: History reviewed. No pertinent family history.  Social History:   reports that she has been smoking cigarettes. She has a 20.00 pack-year smoking history. She has never used smokeless tobacco. She reports previous alcohol use. She reports that she does not use drugs.  Medications: Medications Prior to Admission  Medication  Sig Dispense Refill  . citalopram (CELEXA) 40 MG tablet Take 40 mg by mouth daily.    Marland Kitchen levothyroxine (SYNTHROID) 125 MCG tablet Take 1 tablet (125 mcg total) by mouth daily before breakfast. (Patient taking differently: Take 100 mcg by mouth daily before breakfast. ) 30 tablet 3  . OVER THE COUNTER MEDICATION Take 1 tablet by mouth 2 (two) times daily. OTC sinus medication    . pantoprazole (PROTONIX) 20 MG tablet Take 20 mg by mouth as needed.    . traZODone (DESYREL) 50 MG tablet Take 50-100 mg by mouth at bedtime as needed for sleep.    Marland Kitchen zolpidem (AMBIEN) 5 MG tablet Take 5 mg by mouth at bedtime as needed for sleep.      No results found for this or any previous visit (from the past 48 hour(s)).  No results found.   A comprehensive review of systems was negative.  Blood pressure (!) 118/58, pulse 83, temperature (!) 97.2 F (36.2 C), temperature source Tympanic, resp. rate 16, height 5\' 10"  (1.778 m), weight 88.8 kg, SpO2 98 %.  General appearance: alert, cooperative and appears stated age Head: Normocephalic, without obvious abnormality, atraumatic Neck: supple, symmetrical, trachea midline Cardio: regular rate and rhythm Resp: clear to auscultation bilaterally Extremities: Intact sensation and capillary refill all digits.  +epl/fpl/io.  No wounds.  Pulses: 2+ and symmetric Skin: Skin  color, texture, turgor normal. No rashes or lesions Neurologic: Grossly normal Incision/Wound: none  Assessment/Plan Right carpal tunnel syndrome.  Non operative and operative treatment options have been discussed with the patient and patient wishes to proceed with operative treatment. Risks, benefits, and alternatives of surgery have been discussed and the patient agrees with the plan of care.   Leanora Cover 03/22/2019, 12:16 PM

## 2019-03-29 DIAGNOSIS — G5622 Lesion of ulnar nerve, left upper limb: Secondary | ICD-10-CM | POA: Diagnosis not present

## 2019-03-29 DIAGNOSIS — G5621 Lesion of ulnar nerve, right upper limb: Secondary | ICD-10-CM | POA: Diagnosis not present

## 2019-04-28 DIAGNOSIS — T50905A Adverse effect of unspecified drugs, medicaments and biological substances, initial encounter: Secondary | ICD-10-CM | POA: Diagnosis not present

## 2019-04-28 DIAGNOSIS — R5383 Other fatigue: Secondary | ICD-10-CM | POA: Diagnosis not present

## 2019-04-28 DIAGNOSIS — L659 Nonscarring hair loss, unspecified: Secondary | ICD-10-CM | POA: Diagnosis not present

## 2019-04-30 DIAGNOSIS — E039 Hypothyroidism, unspecified: Secondary | ICD-10-CM | POA: Diagnosis not present

## 2019-05-25 DIAGNOSIS — R42 Dizziness and giddiness: Secondary | ICD-10-CM | POA: Diagnosis not present

## 2019-10-14 DIAGNOSIS — G2401 Drug induced subacute dyskinesia: Secondary | ICD-10-CM | POA: Diagnosis not present

## 2019-10-14 DIAGNOSIS — N959 Unspecified menopausal and perimenopausal disorder: Secondary | ICD-10-CM | POA: Diagnosis not present

## 2019-10-14 DIAGNOSIS — Z6824 Body mass index (BMI) 24.0-24.9, adult: Secondary | ICD-10-CM | POA: Diagnosis not present

## 2019-10-14 DIAGNOSIS — Z1389 Encounter for screening for other disorder: Secondary | ICD-10-CM | POA: Diagnosis not present

## 2019-10-26 DIAGNOSIS — M25462 Effusion, left knee: Secondary | ICD-10-CM | POA: Diagnosis not present

## 2019-10-26 DIAGNOSIS — S83105A Unspecified dislocation of left knee, initial encounter: Secondary | ICD-10-CM | POA: Diagnosis not present

## 2019-10-26 DIAGNOSIS — W010XXA Fall on same level from slipping, tripping and stumbling without subsequent striking against object, initial encounter: Secondary | ICD-10-CM | POA: Diagnosis not present

## 2019-10-26 DIAGNOSIS — E039 Hypothyroidism, unspecified: Secondary | ICD-10-CM | POA: Diagnosis not present

## 2019-10-26 DIAGNOSIS — S83005A Unspecified dislocation of left patella, initial encounter: Secondary | ICD-10-CM | POA: Diagnosis not present

## 2019-10-26 DIAGNOSIS — Z889 Allergy status to unspecified drugs, medicaments and biological substances status: Secondary | ICD-10-CM | POA: Diagnosis not present

## 2019-11-02 DIAGNOSIS — S83005A Unspecified dislocation of left patella, initial encounter: Secondary | ICD-10-CM | POA: Diagnosis not present

## 2019-11-02 DIAGNOSIS — M7122 Synovial cyst of popliteal space [Baker], left knee: Secondary | ICD-10-CM | POA: Diagnosis not present

## 2019-11-02 DIAGNOSIS — M25562 Pain in left knee: Secondary | ICD-10-CM | POA: Diagnosis not present

## 2019-11-02 DIAGNOSIS — S83006A Unspecified dislocation of unspecified patella, initial encounter: Secondary | ICD-10-CM | POA: Diagnosis not present

## 2019-11-16 DIAGNOSIS — S83006A Unspecified dislocation of unspecified patella, initial encounter: Secondary | ICD-10-CM | POA: Diagnosis not present

## 2019-11-16 DIAGNOSIS — M25562 Pain in left knee: Secondary | ICD-10-CM | POA: Diagnosis not present

## 2019-11-24 DIAGNOSIS — S83005D Unspecified dislocation of left patella, subsequent encounter: Secondary | ICD-10-CM | POA: Diagnosis not present

## 2020-02-18 DIAGNOSIS — L03116 Cellulitis of left lower limb: Secondary | ICD-10-CM | POA: Diagnosis not present

## 2020-02-18 DIAGNOSIS — M25512 Pain in left shoulder: Secondary | ICD-10-CM | POA: Diagnosis not present

## 2020-02-18 DIAGNOSIS — Z681 Body mass index (BMI) 19 or less, adult: Secondary | ICD-10-CM | POA: Diagnosis not present

## 2020-02-23 ENCOUNTER — Encounter: Payer: Self-pay | Admitting: *Deleted

## 2020-02-23 ENCOUNTER — Encounter: Payer: Self-pay | Admitting: Orthopedic Surgery

## 2020-02-29 ENCOUNTER — Ambulatory Visit: Payer: Medicare Other | Admitting: Neurology

## 2020-03-30 ENCOUNTER — Encounter: Payer: Self-pay | Admitting: Orthopedic Surgery

## 2020-03-30 ENCOUNTER — Ambulatory Visit (INDEPENDENT_AMBULATORY_CARE_PROVIDER_SITE_OTHER): Payer: Medicare Other | Admitting: Orthopedic Surgery

## 2020-03-30 ENCOUNTER — Ambulatory Visit: Payer: Medicare Other

## 2020-03-30 ENCOUNTER — Other Ambulatory Visit: Payer: Self-pay

## 2020-03-30 VITALS — BP 126/76 | HR 103 | Ht 71.0 in | Wt 160.0 lb

## 2020-03-30 DIAGNOSIS — G8929 Other chronic pain: Secondary | ICD-10-CM | POA: Diagnosis not present

## 2020-03-30 DIAGNOSIS — M25512 Pain in left shoulder: Secondary | ICD-10-CM | POA: Diagnosis not present

## 2020-03-30 DIAGNOSIS — M7542 Impingement syndrome of left shoulder: Secondary | ICD-10-CM | POA: Diagnosis not present

## 2020-03-30 NOTE — Patient Instructions (Addendum)
Shoulder Impingement Syndrome  Shoulder impingement syndrome is a condition that causes pain when connective tissues (tendons) surrounding the shoulder joint become pinched. These tendons are part of the group of muscles and tissues that help to stabilize the shoulder (rotator cuff). Beneath the rotator cuff is a fluid-filled sac (bursa) that allows the muscles and tendons to glide smoothly. The bursa may become swollen or irritated (bursitis). Bursitis, swelling in the rotator cuff tendons, or both conditions can decrease how much space is under a bone in the shoulder joint (acromion), resulting in impingement. What are the causes? Shoulder impingement syndrome may be caused by bursitis or swelling of the rotator cuff tendons, which may result from:  Repetitive overhead arm movements.  Falling onto the shoulder.  Weakness in the shoulder muscles. What increases the risk? You may be more likely to develop this condition if you:  Play sports that involve throwing, such as baseball.  Participate in sports such as tennis, volleyball, and swimming.  Work as a Education administrator, Music therapist, or Pharmacologist. Some people are also more likely to develop impingement syndrome because of the shape of their acromion bone. What are the signs or symptoms? The main symptom of this condition is pain on the front or side of the shoulder. The pain may:  Get worse when lifting or raising the arm.  Get worse at night.  Wake you up from sleeping.  Feel sharp when the shoulder is moved and then fade to an ache. Other symptoms may include:  Tenderness.  Stiffness.  Inability to raise the arm above shoulder level or behind the body.  Weakness. How is this diagnosed? This condition may be diagnosed based on:  Your symptoms and medical history.  A physical exam.  Imaging tests, such as: ? X-rays. ? MRI. ? Ultrasound. How is this treated? This condition may be treated by:  Resting your shoulder and  avoiding all activities that cause pain or put stress on the shoulder.  Icing your shoulder.  NSAIDs to help reduce pain and swelling.  One or more injections of medicines to numb the area and reduce inflammation.  Physical therapy.  Surgery. This may be needed if nonsurgical treatments have not helped. Surgery may involve repairing the rotator cuff, reshaping the acromion, or removing the bursa. Follow these instructions at home: Managing pain, stiffness, and swelling   If directed, put ice on the injured area. ? Put ice in a plastic bag. ? Place a towel between your skin and the bag. ? Leave the ice on for 20 minutes, 2-3 times a day. Activity  Rest and return to your normal activities as told by your health care provider. Ask your health care provider what activities are safe for you.  Do exercises as told by your health care provider. General instructions  Do not use any products that contain nicotine or tobacco, such as cigarettes, e-cigarettes, and chewing tobacco. These can delay healing. If you need help quitting, ask your health care provider.  Ask your health care provider when it is safe for you to drive.  Take over-the-counter and prescription medicines only as told by your health care provider.  Keep all follow-up visits as told by your health care provider. This is important. How is this prevented?  Give your body time to rest between periods of activity.  Be safe and responsible while being active. This will help you avoid falls.  Maintain physical fitness, including strength and flexibility. Contact a health care provider if:  Your  symptoms have not improved after 1-2 months of treatment and rest.  You cannot lift your arm away from your body. Summary  Shoulder impingement syndrome is a condition that causes pain when connective tissues (tendons) surrounding the shoulder joint become pinched.  The main symptom of this condition is pain on the front or  side of the shoulder.  This condition is usually treated with rest, ice, and pain medicines as needed. This information is not intended to replace advice given to you by your health care provider. Make sure you discuss any questions you have with your health care provider. Document Revised: 07/10/2018 Document Reviewed: 09/10/2017 Elsevier Patient Education  2020 [employer].   You have received an injection of steroids into the joint. 15% of patients will have increased pain within the 24 hours postinjection.   This is transient and will go away.   We recommend that you use ice packs on the injection site for 20 minutes every 2 hours and extra strength Tylenol 2 tablets every 8 as needed until the pain resolves.  If you continue to have pain after taking the Tylenol and using the ice please call the office for further instructions.

## 2020-03-30 NOTE — Progress Notes (Addendum)
Patient ID: [name], female   DOB: 1970-11-18, 49 y.o.   MRN: [id number]  ASSESSMENT AND PLAN:  49 year old female seems to have bursitis tendinitis of the rotator cuff left shoulder recommend physical therapy and subacromial injection she can take over-the-counter Advil for pain follow-up with [place] in 6 to 8 weeks    Chief Complaint  Patient presents with  . Shoulder Pain    Left for more than a year       HPI [name] is a 49 y.o. female.  Left shoulder pain x26 year  49 years old had no trauma to the left shoulder but last year started having some intermittent pain in left shoulder which progressively worsened and really got bad this year where she has pain at night decreased range of motion crepitance on range of motion and decreased internal rotation as well there is some weakness which is mild and may be pain related   Review of Systems Review of Systems  Constitutional: Negative for chills and fever.  Skin: Negative for itching and rash.  Neurological: Negative for tingling.    Past Medical History:  Diagnosis Date  . Anxiety   . Chronic back pain   . Chronic neck pain   . Complication of anesthesia    pt states her O2 saturation drops after surgery  . GERD (gastroesophageal reflux disease)   . Headache    migraines- last one 2 months ago  . Hypercholesteremia   . Hypothyroidism   . Reflex sympathetic dystrophy   . Tardive dyskinesia   . Thyroid disease     Past Surgical History:  Procedure Laterality Date  . ABDOMINAL HYSTERECTOMY     partial  . BACK SURGERY     plates and screws  . BREAST SURGERY Left    fibrocystic tumor  . CARPAL TUNNEL RELEASE Right 03/22/2019   Procedure: RIGHT CARPAL TUNNEL RELEASE;  Surgeon: Leanora Cover, MD;  Location: [facility];  Service: Orthopedics;  Laterality: Right;  NO LOCAL  . CYST EXCISION Left    foot  . EAR CYST EXCISION Right 01/27/2013   Procedure: EXCISION CYST ON SCALP ;   Surgeon: Jamesetta So, MD;  Location: [facility];  Service: General;  Laterality: Right;  . EAR CYST EXCISION Right 01/27/2013   Procedure: CYST REMOVAL right axilla;  Surgeon: Jamesetta So, MD;  Location: [facility];  Service: General;  Laterality: Right;  . KNEE ARTHROSCOPY Left   . NECK SURGERY     plate in neck  . SALPINGOOPHORECTOMY Right   . STRABISMUS SURGERY Bilateral   . THYROIDECTOMY N/A 03/14/2014   Procedure: TOTAL THYROIDECTOMY;  Surgeon: Jamesetta So, MD;  Location: [facility];  Service: General;  Laterality: N/A;    Family History  Problem Relation Age of Onset  . Breast cancer Mother   . Parkinson's disease Father   . Depression Sister      Social History   Tobacco Use  . Smoking status: Current Every Day Smoker    Packs/day: 1.00    Years: 20.00    Pack years: 20.00    Types: Cigarettes  . Smokeless tobacco: Never Used  Substance Use Topics  . Alcohol use: Yes    Comment: occasionally  . Drug use: No    Comment: prescription Rx    Allergies  Allergen Reactions  . Darvocet [Propoxyphene N-Acetaminophen] Hives  . Lyrica [Pregabalin] Swelling    Allergies  Allergen Reactions  . Darvocet [Propoxyphene  N-Acetaminophen] Hives  . Lyrica [Pregabalin] Swelling      Current Meds  Medication Sig  . albuterol (VENTOLIN HFA) 108 (90 Base) MCG/ACT inhaler Inhale into the lungs every 6 (six) hours as needed for wheezing or shortness of breath.  . ALPRAZolam (XANAX) 1 MG tablet Take 1 mg by mouth 4 (four) times daily as needed for anxiety.  . baclofen (LIORESAL) 10 MG tablet Take 10 mg by mouth 4 (four) times daily as needed for muscle spasms.  . benztropine (COGENTIN) 1 MG tablet Take 1 mg by mouth 2 (two) times daily.  . citalopram (CELEXA) 40 MG tablet Take 40 mg by mouth daily.  Marland Kitchen gabapentin (NEURONTIN) 300 MG capsule Take 1 capsule by mouth 3 (three) times daily as needed.  Marland Kitchen ibuprofen (ADVIL) 800 MG tablet Take 800 mg by mouth every 8 (eight) hours as  needed.  . lamoTRIgine (LAMICTAL) 25 MG tablet Take 50 mg by mouth 2 (two) times daily.   Marland Kitchen levothyroxine (SYNTHROID) 125 MCG tablet Take 125 mcg by mouth daily before breakfast.  . meclizine (ANTIVERT) 25 MG tablet Take 25 mg by mouth as directed. Take one hour prior to motion sickness.  Marland Kitchen OVER THE COUNTER MEDICATION Take 1 tablet by mouth 2 (two) times daily. OTC sinus medication  . pantoprazole (PROTONIX) 20 MG tablet Take 20 mg by mouth as needed.  . traZODone (DESYREL) 50 MG tablet Take 50-100 mg by mouth at bedtime as needed for sleep.  Marland Kitchen venlafaxine (EFFEXOR) 37.5 MG tablet Take 37.5 mg by mouth 2 (two) times daily.  Marland Kitchen zolpidem (AMBIEN) 10 MG tablet Take 10 mg by mouth at bedtime as needed for sleep.       Physical Exam BP 126/76   Pulse (!) 103   Ht 5\' 11"  (1.803 m)   Wt 160 lb (72.6 kg)   BMI 22.32 kg/m   Ambulatory status normal with no assistive devices  GENERAL : APPEARANCE IS NORMAL GROOMING IS GOOD  NORMAL MOOD AND AFFECT  AWAKE ALERT AND ORIENTED X 3   Left SHOULDER  TENDERNESS periacromial ROM decreased internal rotation STABLE ANTERIORLY POSTERIORLY AND INFERIORLY SKIN CLEAN     PROVOCATIVE TESTS  DROP ARM TEST normal PAINFUL ARC 90-1 20 EMPTY CAN -JOBST TEST pain without weakness EXTERNAL ROTATION LAG TEST normal LIFT OFF TEST normal BELLYPRESS TEST  ON THE OTHER SIDE THE RIGHT SHOULDER HAS NORMAL SKIN, NO ROM DEFICITS, EXCELLENT STABILITY, AND NORMAL 5/5 MMT STRENGTH   MEDICAL DECISION MAKING  A.  Encounter Diagnoses  Name Primary?  . Chronic pain in left shoulder   . Impingement syndrome of left shoulder Yes    B. DATA ANALYSED:    IMAGING: Independent interpretation of images: Internal images normal shoulder see report Orders: NO  Outside records reviewed: NO  C. MANAGEMENT  Nonoperative management Procedure note the subacromial injection shoulder left   Verbal consent was obtained to inject the  Left   Shoulder  Timeout  was completed to confirm the injection site is a subacromial space of the  left  shoulder  Medication used Depo-Medrol 40 mg and lidocaine 1% 3 cc  Anesthesia was provided by ethyl chloride  The injection was performed in the left  posterior subacromial space. After pinning the skin with alcohol and anesthetized the skin with ethyl chloride the subacromial space was injected using a 20-gauge needle. There were no complications  Sterile dressing was applied.         No orders of the defined  types were placed in this encounter.

## 2020-04-05 DIAGNOSIS — L03119 Cellulitis of unspecified part of limb: Secondary | ICD-10-CM | POA: Diagnosis not present

## 2020-04-12 ENCOUNTER — Ambulatory Visit (HOSPITAL_COMMUNITY): Payer: Medicare Other

## 2020-04-13 ENCOUNTER — Ambulatory Visit (HOSPITAL_COMMUNITY): Payer: Medicare Other | Attending: Orthopedic Surgery

## 2020-04-25 ENCOUNTER — Ambulatory Visit (HOSPITAL_COMMUNITY): Payer: Medicare Other

## 2020-05-18 ENCOUNTER — Encounter: Payer: Self-pay | Admitting: *Deleted

## 2020-05-19 ENCOUNTER — Encounter: Payer: Self-pay | Admitting: Neurology

## 2020-05-19 ENCOUNTER — Ambulatory Visit: Payer: Medicare Other | Admitting: Neurology

## 2020-05-25 ENCOUNTER — Ambulatory Visit: Payer: Medicare Other | Admitting: Orthopedic Surgery

## 2020-05-26 DIAGNOSIS — J069 Acute upper respiratory infection, unspecified: Secondary | ICD-10-CM | POA: Diagnosis not present

## 2020-05-26 DIAGNOSIS — Z681 Body mass index (BMI) 19 or less, adult: Secondary | ICD-10-CM | POA: Diagnosis not present

## 2020-06-15 DIAGNOSIS — M79672 Pain in left foot: Secondary | ICD-10-CM | POA: Diagnosis not present

## 2020-06-15 DIAGNOSIS — L600 Ingrowing nail: Secondary | ICD-10-CM | POA: Diagnosis not present

## 2020-06-15 DIAGNOSIS — M79675 Pain in left toe(s): Secondary | ICD-10-CM | POA: Diagnosis not present

## 2020-06-15 DIAGNOSIS — L03032 Cellulitis of left toe: Secondary | ICD-10-CM | POA: Diagnosis not present

## 2020-06-29 DIAGNOSIS — L600 Ingrowing nail: Secondary | ICD-10-CM | POA: Diagnosis not present

## 2020-06-29 DIAGNOSIS — M79675 Pain in left toe(s): Secondary | ICD-10-CM | POA: Diagnosis not present

## 2020-06-29 DIAGNOSIS — L03032 Cellulitis of left toe: Secondary | ICD-10-CM | POA: Diagnosis not present

## 2020-06-29 DIAGNOSIS — M79672 Pain in left foot: Secondary | ICD-10-CM | POA: Diagnosis not present

## 2020-07-08 ENCOUNTER — Ambulatory Visit
Admission: EM | Admit: 2020-07-08 | Discharge: 2020-07-08 | Disposition: A | Discharge status: Home / Self Care | Payer: Medicare Other | Attending: Emergency Medicine | Admitting: Emergency Medicine

## 2020-07-08 ENCOUNTER — Encounter: Payer: Self-pay | Admitting: Emergency Medicine

## 2020-07-08 ENCOUNTER — Other Ambulatory Visit: Payer: Self-pay

## 2020-07-08 DIAGNOSIS — G43009 Migraine without aura, not intractable, without status migrainosus: Secondary | ICD-10-CM | POA: Diagnosis not present

## 2020-07-08 MED ORDER — ONDANSETRON 4 MG PO TBDP
4.0000 mg | ORAL_TABLET | Freq: Once | ORAL | Status: AC
  Administered 2020-07-08: 4 mg via ORAL

## 2020-07-08 MED ORDER — DEXAMETHASONE SODIUM PHOSPHATE 10 MG/ML IJ SOLN
10.0000 mg | Freq: Once | INTRAMUSCULAR | Status: AC
  Administered 2020-07-08: 10 mg via INTRAMUSCULAR

## 2020-07-08 MED ORDER — KETOROLAC TROMETHAMINE 60 MG/2ML IM SOLN
60.0000 mg | Freq: Once | INTRAMUSCULAR | Status: AC
  Administered 2020-07-08: 60 mg via INTRAMUSCULAR

## 2020-07-08 MED ORDER — SUMATRIPTAN SUCCINATE 25 MG PO TABS: 25.0000 mg | ORAL_TABLET | ORAL | 0 refills | Status: DC | PRN

## 2020-07-08 NOTE — ED Triage Notes (Signed)
Headache all week , report hx of migraines.  Also reports having a yeast infection she cant get rid of.

## 2020-07-08 NOTE — ED Provider Notes (Addendum)
[facility]   [id number] 07/08/20 Arrival Time: 1135  [id number]  SUBJECTIVE:  [name] is a 50 y.o. female with history of migraine present to the urgent care with a complaint of headache for the past week.  Patient localizes her pain to the generalized head head.  Describes the pain as constant and achy in character.  Patient has tried OTC ibuprofen without relief.  Denies any aggravating factor.  Reports similar symptoms in the past that improved with migraine cocktail.  This is not the worst headache of her life.  Patient denies fever, chills, nausea, vomiting, aura, rhinorrhea, watery eyes, chest pain, SOB, abdominal pain, weakness, numbness or tingling, slurred speech.    She is also complaining of vaginal yeast infection.  Has tried OTC Monistat without relief.  Reports similar symptoms that improved with Diflucan.  Denies chills, fever, nausea, vomiting, diarrhea  ROS: As per HPI.  All other pertinent ROS negative.     Past Medical History:  Diagnosis Date  . Anxiety   . Chronic back pain   . Chronic neck pain   . Complication of anesthesia    pt states her O2 saturation drops after surgery  . GERD (gastroesophageal reflux disease)   . Headache    migraines- last one 2 months ago  . Hypercholesteremia   . Hypothyroidism   . Kidney stones   . Osteoporosis   . Reflex sympathetic dystrophy   . Tardive dyskinesia   . Thyroid disease    Past Surgical History:  Procedure Laterality Date  . ABDOMINAL HYSTERECTOMY     partial  . APPENDECTOMY    . BACK SURGERY     plates and screws  . BREAST SURGERY Left    fibrocystic tumor  . CARPAL TUNNEL RELEASE Right 03/22/2019   Procedure: RIGHT CARPAL TUNNEL RELEASE;  Surgeon: Leanora Cover, MD;  Location: [facility];  Service: Orthopedics;  Laterality: Right;  NO LOCAL  . CHOLECYSTECTOMY    . CYST EXCISION Left    foot  . EAR CYST EXCISION Right 01/27/2013   Procedure: EXCISION CYST ON SCALP ;   Surgeon: Jamesetta So, MD;  Location: [facility];  Service: General;  Laterality: Right;  . EAR CYST EXCISION Right 01/27/2013   Procedure: CYST REMOVAL right axilla;  Surgeon: Jamesetta So, MD;  Location: [facility];  Service: General;  Laterality: Right;  . KNEE ARTHROSCOPY Left   . NECK SURGERY     plate in neck  . SALPINGOOPHORECTOMY Right   . STRABISMUS SURGERY Bilateral   . THYROIDECTOMY N/A 03/14/2014   Procedure: TOTAL THYROIDECTOMY;  Surgeon: Jamesetta So, MD;  Location: [facility];  Service: General;  Laterality: N/A;   Allergies  Allergen Reactions  . Darvocet [Propoxyphene N-Acetaminophen] Hives  . Lyrica [Pregabalin] Swelling   No current facility-administered medications on file prior to encounter.   Current Outpatient Medications on File Prior to Encounter  Medication Sig Dispense Refill  . albuterol (VENTOLIN HFA) 108 (90 Base) MCG/ACT inhaler Inhale into the lungs every 6 (six) hours as needed for wheezing or shortness of breath.    . ALPRAZolam (XANAX) 1 MG tablet Take 1 mg by mouth 4 (four) times daily as needed for anxiety.    . baclofen (LIORESAL) 10 MG tablet Take 10 mg by mouth 4 (four) times daily as needed for muscle spasms.    . benztropine (COGENTIN) 1 MG tablet Take 1 mg by mouth 2 (two) times daily.    Marland Kitchen  citalopram (CELEXA) 40 MG tablet Take 40 mg by mouth daily.    Marland Kitchen gabapentin (NEURONTIN) 300 MG capsule Take 1 capsule by mouth 3 (three) times daily as needed.    Marland Kitchen ibuprofen (ADVIL) 800 MG tablet Take 800 mg by mouth every 8 (eight) hours as needed.    . lamoTRIgine (LAMICTAL) 25 MG tablet Take 50 mg by mouth 2 (two) times daily.     Marland Kitchen levothyroxine (SYNTHROID) 125 MCG tablet Take 125 mcg by mouth daily before breakfast.    . meclizine (ANTIVERT) 25 MG tablet Take 25 mg by mouth as directed. Take one hour prior to motion sickness.    Marland Kitchen OVER THE COUNTER MEDICATION Take 1 tablet by mouth 2 (two) times daily. OTC sinus medication    . pantoprazole (PROTONIX) 20 MG  tablet Take 20 mg by mouth as needed.    . traZODone (DESYREL) 50 MG tablet Take 50-100 mg by mouth at bedtime as needed for sleep.    Marland Kitchen venlafaxine (EFFEXOR) 37.5 MG tablet Take 37.5 mg by mouth 2 (two) times daily.    Marland Kitchen zolpidem (AMBIEN) 10 MG tablet Take 10 mg by mouth at bedtime as needed for sleep.     Social History   Socioeconomic History  . Marital status: Divorced    Spouse name: Not on file  . Number of children: Not on file  . Years of education: Not on file  . Highest education level: Not on file  Occupational History  . Not on file  Tobacco Use  . Smoking status: Current Every Day Smoker    Packs/day: 1.00    Years: 20.00    Pack years: 20.00    Types: Cigarettes  . Smokeless tobacco: Never Used  Substance and Sexual Activity  . Alcohol use: Yes    Comment: occasionally  . Drug use: No    Comment: prescription Rx  . Sexual activity: Never  Other Topics Concern  . Not on file  Social History Narrative   Lives alone.   Social Determinants of Health   Financial Resource Strain: Not on file  Food Insecurity: Not on file  Transportation Needs: Not on file  Physical Activity: Not on file  Stress: Not on file  Social Connections: Not on file  Intimate Partner Violence: Not on file   Family History  Problem Relation Age of Onset  . Breast cancer Mother   . Parkinson's disease Father   . Depression Sister     OBJECTIVE:  Vitals:   07/08/20 1148  BP: 98/61  Pulse: (!) 106  Resp: 18  Temp: 98.8 F (37.1 C)  TempSrc: Oral  SpO2: 95%    General appearance: alert; no distress Eyes: PERRLA; EOMI HENT: normocephalic; atraumatic Neck: supple with FROM Lungs: clear to auscultation bilaterally Heart: regular rate and rhythm.  Radial pulses 2+ symmetrical bilaterally Extremities: no edema; symmetrical with no gross deformities Skin: warm and dry Neurologic: CN 2-12 grossly intact; finger to nose without difficulty; normal gait; strength and sensation  intact bilaterally about the upper and lower extremities; negative pronator drift Psychological: alert and cooperative; normal mood and affect   ASSESSMENT & PLAN:  1. Migraine without aura and without status migrainosus, not intractable     Meds ordered this encounter  Medications  . SUMAtriptan (IMITREX) 25 MG tablet    Sig: Take 1 tablet (25 mg total) by mouth every 2 (two) hours as needed for migraine. May repeat in 2 hours if headache persists or recurs.  No more than 200 mg in 24 hours    Dispense:  20 tablet    Refill:  0  . ondansetron (ZOFRAN-ODT) disintegrating tablet 4 mg  . ketorolac (TORADOL) injection 60 mg  . dexamethasone (DECADRON) injection 10 mg   Discharge instructions  Migraine cocktail given in office Rest and drink plenty of fluids Use OTC medications as needed for symptomatic relief Follow up with PCP if symptoms persists Return or go to the ER if you have any new or worsening symptoms such as fever, chills, nausea, vomiting, chest pain, shortness of breath, cough, vision changes, worsening headache despite treatment, slurred speech, facial asymmetry, weakness in arms or legs, etc...  Reviewed expectations re: course of current medical issues. Questions answered. Outlined signs and symptoms indicating need for more acute intervention. Patient verbalized understanding. After Visit Summary given.   Emerson Monte, FNP 07/08/20 1232    Emerson Monte, FNP 07/08/20 1233

## 2020-07-08 NOTE — Discharge Instructions (Addendum)
Migraine cocktail given in office Rest and drink plenty of fluids Use OTC medications as needed for symptomatic relief Prescribed Imitrex/take as directed Follow up with PCP if symptoms persists Return or go to the ER if you have any new or worsening symptoms such as fever, chills, nausea, vomiting, chest pain, shortness of breath, cough, vision changes, worsening headache despite treatment, slurred speech, facial asymmetry, weakness in arms or legs, etc..Marland Kitchen

## 2020-07-09 ENCOUNTER — Telehealth: Payer: Self-pay | Admitting: Emergency Medicine

## 2020-07-09 MED ORDER — FLUCONAZOLE 150 MG PO TABS: 150.0000 mg | ORAL_TABLET | Freq: Every day | ORAL | 0 refills | Status: AC

## 2020-07-13 DIAGNOSIS — L600 Ingrowing nail: Secondary | ICD-10-CM | POA: Diagnosis not present

## 2020-07-13 DIAGNOSIS — M79675 Pain in left toe(s): Secondary | ICD-10-CM | POA: Diagnosis not present

## 2020-07-13 DIAGNOSIS — M79672 Pain in left foot: Secondary | ICD-10-CM | POA: Diagnosis not present

## 2020-07-13 DIAGNOSIS — L03032 Cellulitis of left toe: Secondary | ICD-10-CM | POA: Diagnosis not present

## 2020-07-27 ENCOUNTER — Other Ambulatory Visit (HOSPITAL_COMMUNITY): Payer: Self-pay | Admitting: Family Medicine

## 2020-07-27 DIAGNOSIS — J019 Acute sinusitis, unspecified: Secondary | ICD-10-CM | POA: Diagnosis not present

## 2020-07-27 DIAGNOSIS — E063 Autoimmune thyroiditis: Secondary | ICD-10-CM | POA: Diagnosis not present

## 2020-07-27 DIAGNOSIS — Z1231 Encounter for screening mammogram for malignant neoplasm of breast: Secondary | ICD-10-CM

## 2020-07-27 DIAGNOSIS — Z0000 Encounter for general adult medical examination without abnormal findings: Secondary | ICD-10-CM | POA: Diagnosis not present

## 2020-07-27 DIAGNOSIS — Z6824 Body mass index (BMI) 24.0-24.9, adult: Secondary | ICD-10-CM | POA: Diagnosis not present

## 2020-07-27 DIAGNOSIS — J309 Allergic rhinitis, unspecified: Secondary | ICD-10-CM | POA: Diagnosis not present

## 2020-07-27 DIAGNOSIS — I10 Essential (primary) hypertension: Secondary | ICD-10-CM | POA: Diagnosis not present

## 2020-07-27 DIAGNOSIS — G894 Chronic pain syndrome: Secondary | ICD-10-CM | POA: Diagnosis not present

## 2020-07-31 DIAGNOSIS — M79672 Pain in left foot: Secondary | ICD-10-CM | POA: Diagnosis not present

## 2020-07-31 DIAGNOSIS — M79675 Pain in left toe(s): Secondary | ICD-10-CM | POA: Diagnosis not present

## 2020-07-31 DIAGNOSIS — L600 Ingrowing nail: Secondary | ICD-10-CM | POA: Diagnosis not present

## 2020-07-31 DIAGNOSIS — L03032 Cellulitis of left toe: Secondary | ICD-10-CM | POA: Diagnosis not present

## 2020-08-02 DIAGNOSIS — Z0000 Encounter for general adult medical examination without abnormal findings: Secondary | ICD-10-CM | POA: Diagnosis not present

## 2020-08-02 DIAGNOSIS — E039 Hypothyroidism, unspecified: Secondary | ICD-10-CM | POA: Diagnosis not present

## 2020-08-02 DIAGNOSIS — G894 Chronic pain syndrome: Secondary | ICD-10-CM | POA: Diagnosis not present

## 2020-08-02 DIAGNOSIS — E7849 Other hyperlipidemia: Secondary | ICD-10-CM | POA: Diagnosis not present

## 2020-08-07 ENCOUNTER — Other Ambulatory Visit: Payer: Self-pay

## 2020-08-07 ENCOUNTER — Ambulatory Visit (HOSPITAL_COMMUNITY)
Admission: RE | Admit: 2020-08-07 | Discharge: 2020-08-07 | Disposition: A | Discharge status: Home / Self Care | Payer: Medicare Other | Source: Ambulatory Visit | Attending: Family Medicine | Admitting: Family Medicine

## 2020-08-07 DIAGNOSIS — Z1231 Encounter for screening mammogram for malignant neoplasm of breast: Secondary | ICD-10-CM | POA: Diagnosis not present

## 2020-09-10 DIAGNOSIS — H5213 Myopia, bilateral: Secondary | ICD-10-CM | POA: Diagnosis not present

## 2020-10-03 DIAGNOSIS — G9059 Complex regional pain syndrome I of other specified site: Secondary | ICD-10-CM | POA: Diagnosis not present

## 2020-10-03 DIAGNOSIS — Z6823 Body mass index (BMI) 23.0-23.9, adult: Secondary | ICD-10-CM | POA: Diagnosis not present

## 2020-10-03 DIAGNOSIS — K117 Disturbances of salivary secretion: Secondary | ICD-10-CM | POA: Diagnosis not present

## 2020-10-03 DIAGNOSIS — Z23 Encounter for immunization: Secondary | ICD-10-CM | POA: Diagnosis not present

## 2020-10-03 DIAGNOSIS — G2589 Other specified extrapyramidal and movement disorders: Secondary | ICD-10-CM | POA: Diagnosis not present

## 2020-11-15 ENCOUNTER — Encounter: Payer: Self-pay | Admitting: Neurology

## 2020-11-27 NOTE — Progress Notes (Signed)
Assessment/Plan:   1.  Probable tardive dyskinesia  -Patient thinks fairly confidently that she was on Abilify in the past.  We called [facility], which is the only drug store that she has used in the past.  She recently switched to [facility].  They do not have record of any antipsychotic medications.  However, she stated that she used to go to [facility].  We will try to get a copy of the records from there.  If we cannot find record of an antipsychotic use in the past, work-up for chorea would be required, but patient does think that she has been on Abilify (and she thought Risperdal) in the past. -TD is a heterogeneous syndrome depending on a subtle balance between several neurotransmitters in the brain, including DA receptor blockade and hypersensitivity of DA and GABA receptors.  -Patient has very significant dry mouth, likely from the benztropine.  I am going to wean her off of that.  -We will go ahead and start Ingrezza, 40 mg daily for 1 week and then increase to 80 mg daily thereafter.  Talked extensively about risks, benefits, and side effects which included but were not limited to parkinsonism.  Patient expressed understanding.  -Neuro exam is nonfocal and nonlateralizing.   2.  Memory change  -This is almost certainly due to her medications.  I am weaning her off of the Cogentin.  -I think that her Xanax plays a significant role.  The patient is on a lot of Xanax (1 mg 4 times per day) and I would encourage potentially weaning this down and finding medications that treat anxiety on a more long-term basis.  We will leave this to prescribing provider.  Would encourage her to reestablish with psychiatry.  3.  Follow-up 6 months, sooner should new neurologic issues arise.   Subjective:   [name] was seen today in neurologic consultation at the request of Redmond School, MD.  The consultation is for the evaluation of tardive dyskinesia.  Patient has previously seen Dr.  Merlene Laughter.  No notes are available from him. No one accompanies her.   Records from primary care indicate that patient has abnormal movements of the lips, tongue, mouth.   Patient has no idea what medication what caused this.    states that in 2018 she was looking at pics of her mom from the past (mom with metastatic CA) and she went to the bathroom and came out and noted that she was "licking her lips."  She is currently on Cogentin, 1 mg twice per day.  She is on Xanax, 1 mg 4 times per day (verified by PDMP).   Patient does not have a psychiatrist and states that she didn't have one in 2018/2019 when this started.  Has seen psychiatry in the past.    Pt states that the first movement she noted was "sticking out my tongue."  States that it happens all the time.  States that she clears her throat constantly.  When asked who dx her with TD, she states that she diagnosed herself.  States that she saw the commercial on TV and mentioned it to her pcp.  States that her tongue will swell.  States that her PA prescribes her benztropine - it gives her "cotton mouth."    When asked specifically about various antipsychotic medications, she believes strongly that she has been on Abilify.  She thinks that Risperdal sounds familiar.  Doesn't think that has been on seroquel or Zyprexa.  Last neuroimaging  was in 2015 after a fall.  This was a CT brain.  Intracranially, it was unremarkable.  Also c/o memory change - word finding trouble.  Driving and will forget where at and where she is going.    ALLERGIES:   Allergies  Allergen Reactions   Darvocet [Propoxyphene N-Acetaminophen] Hives   Lyrica [Pregabalin] Swelling    CURRENT MEDICATIONS:  Outpatient Encounter Medications as of 11/30/2020  Medication Sig   albuterol (VENTOLIN HFA) 108 (90 Base) MCG/ACT inhaler Inhale into the lungs every 6 (six) hours as needed for wheezing or shortness of breath.   ALPRAZolam (XANAX) 1 MG tablet Take 1 mg by mouth 4 (four)  times daily as needed for anxiety.   baclofen (LIORESAL) 10 MG tablet Take 10 mg by mouth 4 (four) times daily as needed for muscle spasms.   benztropine (COGENTIN) 1 MG tablet Take 1 mg by mouth 2 (two) times daily.   citalopram (CELEXA) 40 MG tablet Take 40 mg by mouth daily.   ibuprofen (ADVIL) 800 MG tablet Take 800 mg by mouth every 8 (eight) hours as needed.   lamoTRIgine (LAMICTAL) 25 MG tablet Take 50 mg by mouth 2 (two) times daily.    levothyroxine (SYNTHROID) 125 MCG tablet Take 125 mcg by mouth daily before breakfast.   meclizine (ANTIVERT) 25 MG tablet Take 25 mg by mouth as directed. Take one hour prior to motion sickness.   OVER THE COUNTER MEDICATION Take 1 tablet by mouth 2 (two) times daily. OTC sinus medication   pantoprazole (PROTONIX) 20 MG tablet Take 20 mg by mouth as needed.   SUMAtriptan (IMITREX) 25 MG tablet Take 1 tablet (25 mg total) by mouth every 2 (two) hours as needed for migraine. May repeat in 2 hours if headache persists or recurs.  No more than 200 mg in 24 hours   traZODone (DESYREL) 50 MG tablet Take 50-100 mg by mouth at bedtime as needed for sleep.   venlafaxine (EFFEXOR) 37.5 MG tablet Take 37.5 mg by mouth 2 (two) times daily.   zolpidem (AMBIEN) 10 MG tablet Take 10 mg by mouth at bedtime as needed for sleep.   [DISCONTINUED] gabapentin (NEURONTIN) 300 MG capsule Take 1 capsule by mouth 3 (three) times daily as needed.   No facility-administered encounter medications on file as of 11/30/2020.    Objective:   PHYSICAL EXAMINATION:    VITALS:   Vitals:   11/30/20 0849  BP: 125/68  Pulse: (!) 113  SpO2: 98%  Weight: 153 lb 6.4 oz (69.6 kg)  Height: '5\' 10"'$  (1.778 m)    GEN:  Normal appears female in no acute distress.  Appears stated age. HEENT:  Normocephalic, atraumatic. The mucous membranes are very dry. The superficial temporal arteries are without ropiness or tenderness. Cardiovascular: Regular rate and rhythm. Lungs: Clear to  auscultation bilaterally. Neck/Heme: There are no carotid bruits noted bilaterally.  NEUROLOGICAL: Orientation:  The patient is alert and oriented x 3.  However, she has difficulty staying focused and on topic. Cranial nerves: There is good facial symmetry.  Extraocular muscles are intact and visual fields are full to confrontational testing. Speech is fluent and clear. Soft palate rises symmetrically and there is no tongue deviation. Hearing is intact to conversational tone. Tone: Tone is good throughout. Sensation: Sensation is intact to light touch throughout. Coordination:  The patient has no difficulty with RAM's or FNF bilaterally.  She has mild trouble with heel-to-shin bilaterally. Motor: Strength is 5/5 in the bilateral upper and  lower extremities.  Shoulder shrug is equal and symmetric. There is no pronator drift.  There are no fasciculations noted. DTR's: Deep tendon reflexes are 2+/4 at the bilateral biceps, triceps, brachioradialis, patella and achilles.  Plantar responses are downgoing bilaterally. Gait and Station: The patient is able to ambulate without difficulty.  Abnormal movements: The patient does have movements of the tongue, occasionally protruding out of the mouth.  She also has choreiform movements of her toes.  She has no abnormal movements of the fingers, hands, arms, or legs.    Total time spent on today's visit was 47 minutes, including both face-to-face time and nonface-to-face time.  Time included that spent on review of records (prior notes available to me/labs/imaging if pertinent), discussing treatment and goals, answering patient's questions and coordinating care.   Cc:  Jake Samples, PA-C

## 2020-11-30 ENCOUNTER — Other Ambulatory Visit: Payer: Self-pay

## 2020-11-30 ENCOUNTER — Ambulatory Visit (INDEPENDENT_AMBULATORY_CARE_PROVIDER_SITE_OTHER): Payer: Medicare Other | Admitting: Neurology

## 2020-11-30 ENCOUNTER — Encounter: Payer: Self-pay | Admitting: Neurology

## 2020-11-30 VITALS — BP 125/68 | HR 113 | Ht 70.0 in | Wt 153.4 lb

## 2020-11-30 DIAGNOSIS — R413 Other amnesia: Secondary | ICD-10-CM

## 2020-11-30 DIAGNOSIS — G2401 Drug induced subacute dyskinesia: Secondary | ICD-10-CM | POA: Diagnosis not present

## 2020-11-30 MED ORDER — VALBENAZINE TOSYLATE 80 MG PO CAPS: ORAL_CAPSULE | ORAL | 0 refills | Status: DC

## 2020-11-30 MED ORDER — VALBENAZINE TOSYLATE 40 MG PO CAPS: ORAL_CAPSULE | ORAL | 0 refills | Status: DC

## 2020-11-30 NOTE — Patient Instructions (Signed)
Start ingrezza 40 mg daily for 1 week and then increase to 80 mg daily thereafter  Decrease benztropine to 1 mg daily for 1 week and the 1/2 pill daily for 1 week and the STOP benztropine.  This should help the dry mouth

## 2020-12-08 ENCOUNTER — Telehealth: Payer: Self-pay

## 2020-12-08 NOTE — Telephone Encounter (Signed)
F/u   Message from Plan  Request Reference Number: [id number]. INGREZZA CAP '80MG'$  is approved through 03/31/2021. Your patient may now fill this prescription and it will be covered.

## 2020-12-08 NOTE — Telephone Encounter (Signed)
New message   Wait for Determination Please wait for [employer] [employer] 2017 NCPDP to return a determination.  [name] (Key: [id number]) Ingrezza '80MG'$  capsules   Form [employer] Medicare Part D Electronic Prior Authorization Form (2017 NCPDP) Created 2 days ago Sent to Plan 2 days ago Plan Response 2 days ago Submit Clinical Questions less than a minute ago Determination

## 2020-12-11 ENCOUNTER — Telehealth: Payer: Self-pay | Admitting: Neurology

## 2020-12-11 ENCOUNTER — Other Ambulatory Visit: Payer: Self-pay

## 2020-12-11 DIAGNOSIS — G2401 Drug induced subacute dyskinesia: Secondary | ICD-10-CM

## 2020-12-11 MED ORDER — VALBENAZINE TOSYLATE 80 MG PO CAPS: 80.0000 mg | ORAL_CAPSULE | Freq: Every day | ORAL | 3 refills | Status: DC

## 2020-12-11 NOTE — Telephone Encounter (Signed)
Called patient and let her know I have sent her 80 mg ingrezza prescription to the [facility] on [address] in [place]

## 2020-12-11 NOTE — Telephone Encounter (Signed)
Pt called in about her '80mg'$  Ingrezza. [facility] on [address] [place] is the pharmacy she wants you to send it to. Michela Pitcher she is almost out     Request Reference Number: [id number]. INGREZZA CAP '80MG'$  is approved through 03/31/2021. Your patient may now fill this prescription and it will be covered.

## 2021-01-26 DIAGNOSIS — E063 Autoimmune thyroiditis: Secondary | ICD-10-CM | POA: Diagnosis not present

## 2021-01-26 DIAGNOSIS — G894 Chronic pain syndrome: Secondary | ICD-10-CM | POA: Diagnosis not present

## 2021-01-26 DIAGNOSIS — I10 Essential (primary) hypertension: Secondary | ICD-10-CM | POA: Diagnosis not present

## 2021-01-26 DIAGNOSIS — G9059 Complex regional pain syndrome I of other specified site: Secondary | ICD-10-CM | POA: Diagnosis not present

## 2021-01-26 DIAGNOSIS — L821 Other seborrheic keratosis: Secondary | ICD-10-CM | POA: Diagnosis not present

## 2021-01-26 DIAGNOSIS — Z6823 Body mass index (BMI) 23.0-23.9, adult: Secondary | ICD-10-CM | POA: Diagnosis not present

## 2021-01-26 DIAGNOSIS — Z1389 Encounter for screening for other disorder: Secondary | ICD-10-CM | POA: Diagnosis not present

## 2021-01-26 DIAGNOSIS — G2401 Drug induced subacute dyskinesia: Secondary | ICD-10-CM | POA: Diagnosis not present

## 2021-02-01 ENCOUNTER — Ambulatory Visit
Admission: EM | Admit: 2021-02-01 | Discharge: 2021-02-01 | Disposition: A | Discharge status: Home / Self Care | Payer: Medicare Other | Attending: Urgent Care | Admitting: Urgent Care

## 2021-02-01 ENCOUNTER — Other Ambulatory Visit: Payer: Self-pay

## 2021-02-01 DIAGNOSIS — K0889 Other specified disorders of teeth and supporting structures: Secondary | ICD-10-CM | POA: Diagnosis not present

## 2021-02-01 DIAGNOSIS — K047 Periapical abscess without sinus: Secondary | ICD-10-CM | POA: Diagnosis not present

## 2021-02-01 DIAGNOSIS — R519 Headache, unspecified: Secondary | ICD-10-CM

## 2021-02-01 MED ORDER — KETOROLAC TROMETHAMINE 60 MG/2ML IM SOLN
60.0000 mg | Freq: Once | INTRAMUSCULAR | Status: AC
  Administered 2021-02-01: 60 mg via INTRAMUSCULAR

## 2021-02-01 MED ORDER — MELOXICAM 15 MG PO TABS: 15.0000 mg | ORAL_TABLET | Freq: Every day | ORAL | 1 refills | Status: DC

## 2021-02-01 MED ORDER — AMOXICILLIN 875 MG PO TABS: 875.0000 mg | ORAL_TABLET | Freq: Two times a day (BID) | ORAL | 0 refills | Status: DC

## 2021-02-01 NOTE — ED Provider Notes (Signed)
[place]   MRN: [id number] DOB: September 30, 1970  Subjective:   [name] is a 50 y.o. female presenting for 1 week history of persistent and worsening severe left-sided dental pain, radiates up toward her ear and into her face.  Has been having a difficult time trying to sleep.  Has used Aleve without relief.  Has multiple dental issues.  Supposed to have her teeth taken out entirely in February of next year.  She called her dentist office but unfortunately they were unable to see her today.  No chest pain, shortness of breath, diaphoresis, fevers.  No current facility-administered medications for this encounter.  Current Outpatient Medications:    albuterol (VENTOLIN HFA) 108 (90 Base) MCG/ACT inhaler, Inhale into the lungs every 6 (six) hours as needed for wheezing or shortness of breath., Disp: , Rfl:    ALPRAZolam (XANAX) 1 MG tablet, Take 1 mg by mouth 4 (four) times daily as needed for anxiety., Disp: , Rfl:    baclofen (LIORESAL) 10 MG tablet, Take 10 mg by mouth 4 (four) times daily as needed for muscle spasms., Disp: , Rfl:    benztropine (COGENTIN) 1 MG tablet, Take 1 mg by mouth 2 (two) times daily., Disp: , Rfl:    citalopram (CELEXA) 40 MG tablet, Take 40 mg by mouth daily., Disp: , Rfl:    ibuprofen (ADVIL) 800 MG tablet, Take 800 mg by mouth every 8 (eight) hours as needed., Disp: , Rfl:    lamoTRIgine (LAMICTAL) 25 MG tablet, Take 50 mg by mouth 2 (two) times daily. , Disp: , Rfl:    levothyroxine (SYNTHROID) 125 MCG tablet, Take 112 mcg by mouth daily before breakfast., Disp: , Rfl:    OVER THE COUNTER MEDICATION, Take 1 tablet by mouth 2 (two) times daily. OTC sinus medication, Disp: , Rfl:    pantoprazole (PROTONIX) 20 MG tablet, Take 20 mg by mouth as needed., Disp: , Rfl:    SUMAtriptan (IMITREX) 25 MG tablet, Take 1 tablet (25 mg total) by mouth every 2 (two) hours as needed for migraine. May repeat in 2 hours if headache persists or recurs.  No  more than 200 mg in 24 hours, Disp: 20 tablet, Rfl: 0   traZODone (DESYREL) 50 MG tablet, Take 50-100 mg by mouth at bedtime as needed for sleep., Disp: , Rfl:    valbenazine (INGREZZA) 40 MG capsule, Samples of this drug were given to the patient, quantity 1, Lot Number [id number] 09-25-2021, Disp: 30 capsule, Rfl: 0   valbenazine (INGREZZA) 80 MG capsule, Samples of this drug were given to the patient, quantity 1, Lot Number [id number] exp 07-16-2021, Disp: 30 capsule, Rfl: 0   valbenazine (INGREZZA) 80 MG capsule, Take 1 capsule (80 mg total) by mouth daily., Disp: 30 capsule, Rfl: 3   venlafaxine (EFFEXOR) 37.5 MG tablet, Take 37.5 mg by mouth 2 (two) times daily., Disp: , Rfl:    zolpidem (AMBIEN) 10 MG tablet, Take 10 mg by mouth at bedtime as needed for sleep., Disp: , Rfl:    Allergies  Allergen Reactions   Darvocet [Propoxyphene N-Acetaminophen] Hives   Lyrica [Pregabalin] Swelling    Past Medical History:  Diagnosis Date   Anxiety    Chronic back pain    Chronic neck pain    Complication of anesthesia    pt states her O2 saturation drops after surgery   GERD (gastroesophageal reflux disease)    Headache    migraines- last one 2 months ago  Hypercholesteremia    Hypothyroidism    Kidney stones    Osteoporosis    Reflex sympathetic dystrophy    Tardive dyskinesia    Thyroid disease      Past Surgical History:  Procedure Laterality Date   ABDOMINAL HYSTERECTOMY     partial   APPENDECTOMY     BACK SURGERY     plates and screws   BREAST SURGERY Left    fibrocystic tumor   CARPAL TUNNEL RELEASE Right 03/22/2019   Procedure: RIGHT CARPAL TUNNEL RELEASE;  Surgeon: Leanora Cover, MD;  Location: [facility];  Service: Orthopedics;  Laterality: Right;  NO LOCAL   CHOLECYSTECTOMY     CYST EXCISION Left    foot   EAR CYST EXCISION Right 01/27/2013   Procedure: EXCISION CYST ON SCALP ;  Surgeon: Jamesetta So, MD;  Location: [facility];  Service: General;   Laterality: Right;   EAR CYST EXCISION Right 01/27/2013   Procedure: CYST REMOVAL right axilla;  Surgeon: Jamesetta So, MD;  Location: [facility];  Service: General;  Laterality: Right;   KNEE ARTHROSCOPY Left    NECK SURGERY     plate in neck   SALPINGOOPHORECTOMY Right    STRABISMUS SURGERY Bilateral    THYROIDECTOMY N/A 03/14/2014   Procedure: TOTAL THYROIDECTOMY;  Surgeon: Jamesetta So, MD;  Location: [facility];  Service: General;  Laterality: N/A;    Family History  Problem Relation Age of Onset   Breast cancer Mother    Parkinson's disease Father    Depression Sister     Social History   Tobacco Use   Smoking status: Every Day    Packs/day: 1.00    Years: 20.00    Pack years: 20.00    Types: Cigarettes   Smokeless tobacco: Never  Substance Use Topics   Alcohol use: Yes    Comment: occasionally   Drug use: No    Comment: prescription Rx    ROS   Objective:   Vitals: BP 100/65 (BP Location: Right Arm)   Pulse 85   Temp 99.1 F (37.3 C) (Oral)   Resp 18   SpO2 95%   Physical Exam Constitutional:      General: She is not in acute distress.    Appearance: She is well-developed. She is not ill-appearing.  HENT:     Head: Normocephalic and atraumatic.     Right Ear: Tympanic membrane, ear canal and external ear normal. No drainage or tenderness. No middle ear effusion. Tympanic membrane is not erythematous.     Left Ear: Tympanic membrane, ear canal and external ear normal. No drainage or tenderness.  No middle ear effusion. Tympanic membrane is not erythematous.     Nose: No congestion or rhinorrhea.     Mouth/Throat:     Mouth: Mucous membranes are moist. No oral lesions.     Pharynx: No pharyngeal swelling, oropharyngeal exudate, posterior oropharyngeal erythema or uvula swelling.     Tonsils: No tonsillar exudate or tonsillar abscesses.     Comments: Multiple missing molars.  Remaining anterior teeth on the upper and lower side with gingival recession,  erythema.  Exquisite tenderness over the left upper side with associated gingival swelling.  Facial tenderness of the left side. Eyes:     Extraocular Movements: Extraocular movements intact.     Right eye: Normal extraocular motion.     Left eye: Normal extraocular motion.     Conjunctiva/sclera: Conjunctivae normal.     Pupils: Pupils  are equal, round, and reactive to light.  Cardiovascular:     Rate and Rhythm: Normal rate.  Pulmonary:     Effort: Pulmonary effort is normal.  Musculoskeletal:     Cervical back: Normal range of motion and neck supple.  Lymphadenopathy:     Cervical: No cervical adenopathy.  Skin:    General: Skin is warm and dry.  Neurological:     General: No focal deficit present.     Mental Status: She is alert and oriented to person, place, and time.  Psychiatric:        Mood and Affect: Mood normal.        Behavior: Behavior normal.    Assessment and Plan :   I have reviewed the PDMP during this encounter.  1. Dental infection   2. Pain, dental   3. Facial pain    Start amoxicillin for dental infection/abscess, use meloxicam for pain and inflammation. Emphasized need for dental surgeon consult.  We will avoid the use of narcotic pain medications based off of database reviewed.  Counseled patient on potential for adverse effects with medications prescribed/recommended today, strict ER and return-to-clinic precautions discussed, patient verbalized understanding.    Jaynee Eagles, [place] 02/01/21 [phone]

## 2021-02-01 NOTE — ED Triage Notes (Signed)
One week h/o upper left sided tooth pain that increased in intensity last night. Pain interfered with her sleep. Has tried aleve without relief. Pt was not able to reach her dentist today. Notes she is scheduled to have all of her teeth removed in 2/22.

## 2021-03-20 ENCOUNTER — Telehealth: Payer: Self-pay

## 2021-03-20 NOTE — Telephone Encounter (Signed)
New message   Your information has been sent to [facility].  [name] (Key: [id number]) Ingrezza 80MG  capsules   Form [employer] Medicare Part D Electronic Prior Authorization Form (2017 NCPDP) Created 4 minutes ago Sent to Plan 4 minutes ago Plan Response 4 minutes ago Submit Clinical Questions less than a minute ago Determination Wait for Determination Please wait for [employer] [employer] 2017 NCPDP to return a determination.

## 2021-03-20 NOTE — Telephone Encounter (Signed)
Keajah Wachter (Key: [id number]) Ingrezza 80MG  capsules   Form [facility] Medicare Part D Electronic Prior Authorization Form (2017 NCPDP) Created 32 minutes ago Sent to Plan 31 minutes ago Plan Response 31 minutes ago Submit Clinical Questions 27 minutes ago Determination Favorable 24 minutes ago Message from Plan Request Reference Number: [id number]. INGREZZA CAP 80MG  is approved through 03/31/2022. Your patient may now fill this prescription and it will be covered.

## 2021-04-26 ENCOUNTER — Emergency Department (HOSPITAL_COMMUNITY)
Admission: EM | Admit: 2021-04-26 | Discharge: 2021-04-26 | Disposition: A | Discharge status: Home / Self Care | Payer: Medicare Other | Attending: Emergency Medicine | Admitting: Emergency Medicine

## 2021-04-26 ENCOUNTER — Encounter (HOSPITAL_COMMUNITY): Payer: Self-pay | Admitting: *Deleted

## 2021-04-26 DIAGNOSIS — R69 Illness, unspecified: Secondary | ICD-10-CM | POA: Diagnosis not present

## 2021-04-26 DIAGNOSIS — E039 Hypothyroidism, unspecified: Secondary | ICD-10-CM | POA: Insufficient documentation

## 2021-04-26 DIAGNOSIS — F13239 Sedative, hypnotic or anxiolytic dependence with withdrawal, unspecified: Secondary | ICD-10-CM | POA: Diagnosis not present

## 2021-04-26 DIAGNOSIS — R251 Tremor, unspecified: Secondary | ICD-10-CM | POA: Diagnosis present

## 2021-04-26 DIAGNOSIS — Z79899 Other long term (current) drug therapy: Secondary | ICD-10-CM | POA: Diagnosis not present

## 2021-04-26 DIAGNOSIS — F13930 Sedative, hypnotic or anxiolytic use, unspecified with withdrawal, uncomplicated: Secondary | ICD-10-CM

## 2021-04-26 NOTE — Discharge Instructions (Signed)
As discussed I suspect your symptoms are related to withdrawal from your Xanax.  Now that you have this prescription once again the symptoms should resolve.  Plan close follow-up with your primary provider if you have persistent or worsening symptoms.

## 2021-04-26 NOTE — ED Notes (Signed)
Per Almyra Free PA, pt verbalized to her that she wants to leave. Per PA, pt will be dcd and not AMA. Pt walked out with her ride without AVS

## 2021-04-26 NOTE — ED Triage Notes (Signed)
Shaking on left side x 1 week

## 2021-04-28 NOTE — ED Provider Notes (Signed)
[facility] EMERGENCY DEPARTMENT Provider Note   CSN: [id number] Arrival date & time: 04/26/21  1405     History  Chief Complaint  Patient presents with   Tremors    [name] is a 51 y.o. female with hx significant for anxiety, hypothyroidism, h/o tardive dyskinesia, RSD and chronic back and neck pain, also on chronic Xanax for anxiety, reporting left side upper and lower extremity tremor which has been intermittent, at times feels like "restless leg syndrome" and has noticed a slower than normal gait but no difficulty walking.  Significantly, she ran out of her xanax last week and was just able to get her refill today.  She took 2 tablets prior to coming in and notes she is already beginning to feel better.  She denies  current fever, chills, focal weakness, dizziness, headache, n/v, chest pain or sob.  She has had her thyroid function checked within the past 3 months and did not require dosing adjustment.   The history is provided by the patient.      Home Medications Prior to Admission medications   Medication Sig Start Date End Date Taking? Authorizing Provider  albuterol (VENTOLIN HFA) 108 (90 Base) MCG/ACT inhaler Inhale into the lungs every 6 (six) hours as needed for wheezing or shortness of breath.    [provider]  ALPRAZolam Duanne Moron) 1 MG tablet Take 1 mg by mouth 4 (four) times daily as needed for anxiety.    [provider]  amoxicillin (AMOXIL) 875 MG tablet Take 1 tablet (875 mg total) by mouth 2 (two) times daily. 02/01/21   Jaynee Eagles, PA-C  baclofen (LIORESAL) 10 MG tablet Take 10 mg by mouth 4 (four) times daily as needed for muscle spasms.    [provider]  benztropine (COGENTIN) 1 MG tablet Take 1 mg by mouth 2 (two) times daily.    [provider]  citalopram (CELEXA) 40 MG tablet Take 40 mg by mouth daily.    [provider]  ibuprofen (ADVIL) 800 MG tablet Take 800 mg by mouth every 8 (eight) hours as  needed.    [provider]  lamoTRIgine (LAMICTAL) 25 MG tablet Take 50 mg by mouth 2 (two) times daily.  10/14/19   [provider]  levothyroxine (SYNTHROID) 125 MCG tablet Take 112 mcg by mouth daily before breakfast.    [provider]  meloxicam (MOBIC) 15 MG tablet Take 1 tablet (15 mg total) by mouth daily. 02/01/21   Jaynee Eagles, PA-C  OVER THE COUNTER MEDICATION Take 1 tablet by mouth 2 (two) times daily. OTC sinus medication    [provider]  pantoprazole (PROTONIX) 20 MG tablet Take 20 mg by mouth as needed.    [provider]  SUMAtriptan (IMITREX) 25 MG tablet Take 1 tablet (25 mg total) by mouth every 2 (two) hours as needed for migraine. May repeat in 2 hours if headache persists or recurs.  No more than 200 mg in 24 hours 07/08/20   Avegno, Darrelyn Hillock, FNP  traZODone (DESYREL) 50 MG tablet Take 50-100 mg by mouth at bedtime as needed for sleep.    [provider]  valbenazine [facility]) 40 MG capsule Samples of this drug were given to the patient, quantity 1, Lot Number [id number] Exp 09-25-2021 11/30/20   Tat, Eustace Quail, DO  valbenazine [facility]) 80 MG capsule Samples of this drug were given to the patient, quantity 1, Lot Number [id number] exp 07-16-2021 11/30/20   Tat, Wells Guiles  S, DO  valbenazine (INGREZZA) 80 MG capsule Take 1 capsule (80 mg total) by mouth daily. 12/11/20   Tat, Eustace Quail, DO  venlafaxine (EFFEXOR) 37.5 MG tablet Take 37.5 mg by mouth 2 (two) times daily.    [provider]  zolpidem (AMBIEN) 10 MG tablet Take 10 mg by mouth at bedtime as needed for sleep.    [provider]      Allergies    Darvocet [propoxyphene n-acetaminophen] and Lyrica [pregabalin]    Review of Systems   Review of Systems  Constitutional:  Negative for chills, diaphoresis and fever.  HENT:  Negative for congestion and sore throat.   Eyes: Negative.   Respiratory:  Negative for chest tightness and shortness of breath.    Cardiovascular:  Negative for chest pain.  Gastrointestinal:  Negative for abdominal pain, nausea and vomiting.  Genitourinary: Negative.   Musculoskeletal:  Positive for back pain. Negative for arthralgias, joint swelling and neck stiffness.       Chronic  Skin: Negative.  Negative for rash and wound.  Neurological:  Positive for tremors. Negative for dizziness, speech difficulty, weakness, light-headedness, numbness and headaches.  Psychiatric/Behavioral: Negative.  Negative for confusion. The patient is not nervous/anxious.    Physical Exam Updated Vital Signs BP 100/70    Pulse 63    Temp 97.9 F (36.6 C) (Oral)    Resp 18    Ht 5\' 10"  (1.778 m)    Wt 68 kg    SpO2 98%    BMI 21.52 kg/m  Physical Exam Vitals and nursing note reviewed.  Constitutional:      Appearance: She is well-developed.  HENT:     Head: Normocephalic and atraumatic.     Right Ear: Tympanic membrane normal.     Left Ear: Tympanic membrane normal.  Eyes:     Extraocular Movements: Extraocular movements intact.     Pupils: Pupils are equal, round, and reactive to light.  Cardiovascular:     Rate and Rhythm: Normal rate.     Heart sounds: Normal heart sounds.  Pulmonary:     Effort: Pulmonary effort is normal.  Abdominal:     Palpations: Abdomen is soft.     Tenderness: There is no abdominal tenderness.  Musculoskeletal:        General: Normal range of motion.     Cervical back: Normal range of motion and neck supple.  Lymphadenopathy:     Cervical: No cervical adenopathy.  Skin:    General: Skin is warm and dry.     Findings: No rash.  Neurological:     General: No focal deficit present.     Mental Status: She is alert and oriented to person, place, and time.     GCS: GCS eye subscore is 4. GCS verbal subscore is 5. GCS motor subscore is 6.     Cranial Nerves: No cranial nerve deficit or dysarthria.     Sensory: Sensation is intact. No sensory deficit.     Motor: Motor function is intact. No  pronator drift.     Coordination: Coordination normal. Heel to Shin Test normal.     Gait: Gait normal.     Deep Tendon Reflexes: Reflexes normal.     Reflex Scores:      Bicep reflexes are 2+ on the right side and 2+ on the left side.      Patellar reflexes are 3+ on the right side and 3+ on the left side.    Comments:  Normal heel-shin, normal rapid alternating movements. Cranial nerves III-XII intact.  No pronator drift. Barely perceptible left sided intentional tremor. Equal grip strength.    Psychiatric:        Speech: Speech normal.        Behavior: Behavior normal.        Thought Content: Thought content normal.    ED Results / Procedures / Treatments   Labs (all labs ordered are listed, but only abnormal results are displayed) Labs Reviewed - No data to display  EKG None  Radiology No results found.  Procedures Procedures    Medications Ordered in ED Medications - No data to display  ED Course/ Medical Decision Making/ A&P                           Medical Decision Making Pt with tremor in association with suspected mild benzodiazepine withdrawal with improving sx since restating this med this am.  Discussed lab testing, but pt prefers to go home at this time since her symptoms are improving.  She has no worrisome signs of severe benzo withdrawal and she seemed stable for dc home.  Return precautions outlined.  Also discussed need to avoid running out or stopping her xanax abruptly.  Pt aware.    Amount and/or Complexity of Data Reviewed Labs:     Details: discussed basic labs including TSH,  however, has been normal range within the past 3 months.  Pt defers lab testing.           Final Clinical Impression(s) / ED Diagnoses Final diagnoses:  Benzodiazepine withdrawal without complication [facility])    Rx / DC Orders ED Discharge Orders     None         Landis Martins 04/28/21 1908    Truddie Hidden, MD 05/01/21 [phone]

## 2021-05-04 DIAGNOSIS — J329 Chronic sinusitis, unspecified: Secondary | ICD-10-CM | POA: Diagnosis not present

## 2021-05-04 DIAGNOSIS — I10 Essential (primary) hypertension: Secondary | ICD-10-CM | POA: Diagnosis not present

## 2021-05-04 DIAGNOSIS — Z6823 Body mass index (BMI) 23.0-23.9, adult: Secondary | ICD-10-CM | POA: Diagnosis not present

## 2021-05-04 DIAGNOSIS — E063 Autoimmune thyroiditis: Secondary | ICD-10-CM | POA: Diagnosis not present

## 2021-05-21 ENCOUNTER — Other Ambulatory Visit: Payer: Self-pay | Admitting: Neurology

## 2021-06-08 NOTE — Progress Notes (Deleted)
? ?Assessment/Plan:  ? ?*** ?1.  Probable tardive dyskinesia ?            -Appears likely on Abilify (and Risperdal) in the past and tardive dyskinesia likely from that. ?            -Continue Ingrezza, 80 mg daily. Talked extensively about risks, benefits, and side effects which included but were not limited to parkinsonism.  Patient expressed understanding. ?            -Neuro exam is nonfocal and nonlateralizing. ?  ?  ?2.  Memory change ?            -This is almost certainly due to her medications.  Continue to be significantly worried about the amount of Xanax she is taking.     She is on 1 mg 4 times per day, and not on any standard of care treatment for long-term anxiety and depression.  Again, would encourage her to reestablish with psychiatry, so that proper treatment can be initiated. ? ?Subjective:  ? ?[name] was seen today in follow up for ***.  My previous records as well as any outside records available were reviewed prior to todays visit.  Patient started on Ingrezza last visit for probable tardive dyskinesia (she reported she likely was on Abilify and probably Risperdal in the past, although I had trouble confirming that).  She is currently on Ingrezza, 80 mg daily.  She reports today that ***. ? ?Last visit, she was complaining about memory change, and I told her I thought it was almost certainly due to her medications.  I weaned her off of the Cogentin, as it was not particularly effective for tardive dyskinesia for her.  However, I told her that I also thought Xanax played a significant role in her memory change.  She was on 1 mg 4 times per day.  I told her I thought she would need to wean that down and treat anxiety differently on a long-term basis.  I also told her that we would need to leave this to the prescribing provider, and encouraged her to establish care with psychiatry.  Records reviewed.  On January 26 she was actually in the emergency room with Xanax withdrawal.  There  was no seizure.  They described it as "barely perceptible left-sided intention tremor."  They called that benzodiazepine withdrawal.  This is despite the fact that PDMP indicates number 120 pills of 1 mg Xanax refilled on December 27 and the date of ER visit, January 26. ? ?PREVIOUS MEDICATIONS: {Parkinson's RX:18200} ? ?CURRENT MEDICATIONS:  ?Outpatient Encounter Medications as of 06/11/2021  ?Medication Sig  ? valbenazine (INGREZZA) 80 MG capsule Take one capsule by mouth daily  ? albuterol (VENTOLIN HFA) 108 (90 Base) MCG/ACT inhaler Inhale into the lungs every 6 (six) hours as needed for wheezing or shortness of breath.  ? ALPRAZolam (XANAX) 1 MG tablet Take 1 mg by mouth 4 (four) times daily as needed for anxiety.  ? amoxicillin (AMOXIL) 875 MG tablet Take 1 tablet (875 mg total) by mouth 2 (two) times daily.  ? baclofen (LIORESAL) 10 MG tablet Take 10 mg by mouth 4 (four) times daily as needed for muscle spasms.  ? benztropine (COGENTIN) 1 MG tablet Take 1 mg by mouth 2 (two) times daily.  ? citalopram (CELEXA) 40 MG tablet Take 40 mg by mouth daily.  ? ibuprofen (ADVIL) 800 MG tablet Take 800 mg by mouth every 8 (eight) hours as needed.  ?  lamoTRIgine (LAMICTAL) 25 MG tablet Take 50 mg by mouth 2 (two) times daily.   ? levothyroxine (SYNTHROID) 125 MCG tablet Take 112 mcg by mouth daily before breakfast.  ? meloxicam (MOBIC) 15 MG tablet Take 1 tablet (15 mg total) by mouth daily.  ? OVER THE COUNTER MEDICATION Take 1 tablet by mouth 2 (two) times daily. OTC sinus medication  ? pantoprazole (PROTONIX) 20 MG tablet Take 20 mg by mouth as needed.  ? SUMAtriptan (IMITREX) 25 MG tablet Take 1 tablet (25 mg total) by mouth every 2 (two) hours as needed for migraine. May repeat in 2 hours if headache persists or recurs.  No more than 200 mg in 24 hours  ? traZODone (DESYREL) 50 MG tablet Take 50-100 mg by mouth at bedtime as needed for sleep.  ? valbenazine (INGREZZA) 40 MG capsule Samples of this drug were given to  the patient, quantity 1, Lot Number [id number] Exp 09-25-2021  ? valbenazine (INGREZZA) 80 MG capsule Samples of this drug were given to the patient, quantity 1, Lot Number [id number] exp 07-16-2021  ? valbenazine (INGREZZA) 80 MG capsule Take 1 capsule (80 mg total) by mouth daily.  ? venlafaxine (EFFEXOR) 37.5 MG tablet Take 37.5 mg by mouth 2 (two) times daily.  ? zolpidem (AMBIEN) 10 MG tablet Take 10 mg by mouth at bedtime as needed for sleep.  ? ?No facility-administered encounter medications on file as of 06/11/2021.  ? ? ? ?Objective:  ? ?PHYSICAL EXAMINATION:   ? ?VITALS:  There were no vitals filed for this visit. ? ?GEN:  The patient appears stated age and is in NAD. ?HEENT:  Normocephalic, atraumatic.  The mucous membranes are moist. The superficial temporal arteries are without ropiness or tenderness. ?CV:  RRR ?Lungs:  CTAB ?Neck/HEME:  There are no carotid bruits bilaterally. ? ?Neurological examination: ? ?Orientation:  The patient is alert and oriented x 3.  However, she has difficulty staying focused and on topic. ?Cranial nerves: There is good facial symmetry.  Extraocular muscles are intact and visual fields are full to confrontational testing. Speech is fluent and clear. Soft palate rises symmetrically and there is no tongue deviation. Hearing is intact to conversational tone. ?Tone: Tone is good throughout. ?Sensation: Sensation is intact to light touch throughout. ?Coordination:  The patient has no difficulty with RAM's or FNF bilaterally.  She has mild trouble with heel-to-shin bilaterally. ?Motor: Strength is 5/5 in the bilateral upper and lower extremities.  Shoulder shrug is equal and symmetric. There is no pronator drift.  There are no fasciculations noted. ?DTR's: Deep tendon reflexes are 2+/4 at the bilateral biceps, triceps, brachioradialis, patella and achilles.  Plantar responses are downgoing bilaterally. ?Gait and Station: The patient is able to ambulate without difficulty.  ?Abnormal  movements: The patient does have movements of the tongue, occasionally protruding out of the mouth.  She also has choreiform movements of her toes.  She has no abnormal movements of the fingers, hands, arms, or legs. ? ? ? ?***Total time spent on today's visit was *** minutes, including both face-to-face time and nonface-to-face time.  Time included that spent on review of records (prior notes available to me/labs/imaging if pertinent), discussing treatment and goals, answering patient's questions and coordinating care. ? ?Cc:  Jake Samples, PA-C ? ?

## 2021-06-11 ENCOUNTER — Encounter: Payer: Self-pay | Admitting: Neurology

## 2021-06-11 ENCOUNTER — Ambulatory Visit: Payer: Medicare Other | Admitting: Neurology

## 2021-06-11 DIAGNOSIS — Z029 Encounter for administrative examinations, unspecified: Secondary | ICD-10-CM

## 2021-07-03 ENCOUNTER — Other Ambulatory Visit: Payer: Self-pay | Admitting: Neurology

## 2021-07-04 ENCOUNTER — Other Ambulatory Visit: Payer: Self-pay

## 2021-07-04 DIAGNOSIS — G2401 Drug induced subacute dyskinesia: Secondary | ICD-10-CM

## 2021-07-04 MED ORDER — VALBENAZINE TOSYLATE 80 MG PO CAPS: 80.0000 mg | ORAL_CAPSULE | Freq: Every day | ORAL | 0 refills | Status: DC

## 2021-07-22 ENCOUNTER — Other Ambulatory Visit: Payer: Self-pay

## 2021-07-22 ENCOUNTER — Encounter (HOSPITAL_COMMUNITY): Payer: Self-pay

## 2021-07-22 ENCOUNTER — Emergency Department (HOSPITAL_COMMUNITY)
Admission: EM | Admit: 2021-07-22 | Discharge: 2021-07-22 | Disposition: A | Discharge status: Home / Self Care | Payer: Medicare Other | Attending: Emergency Medicine | Admitting: Emergency Medicine

## 2021-07-22 DIAGNOSIS — H20012 Primary iridocyclitis, left eye: Secondary | ICD-10-CM | POA: Diagnosis not present

## 2021-07-22 DIAGNOSIS — Z79899 Other long term (current) drug therapy: Secondary | ICD-10-CM | POA: Diagnosis not present

## 2021-07-22 DIAGNOSIS — E039 Hypothyroidism, unspecified: Secondary | ICD-10-CM | POA: Insufficient documentation

## 2021-07-22 DIAGNOSIS — H5712 Ocular pain, left eye: Secondary | ICD-10-CM | POA: Diagnosis present

## 2021-07-22 DIAGNOSIS — H209 Unspecified iridocyclitis: Secondary | ICD-10-CM | POA: Insufficient documentation

## 2021-07-22 MED ORDER — FLUORESCEIN SODIUM 1 MG OP STRP
1.0000 | ORAL_STRIP | Freq: Once | OPHTHALMIC | Status: AC
  Administered 2021-07-22: 1 via OPHTHALMIC
  Filled 2021-07-22: qty 1

## 2021-07-22 MED ORDER — IBUPROFEN 800 MG PO TABS
800.0000 mg | ORAL_TABLET | Freq: Once | ORAL | Status: AC
  Administered 2021-07-22: 800 mg via ORAL
  Filled 2021-07-22: qty 1

## 2021-07-22 MED ORDER — TETRACAINE HCL 0.5 % OP SOLN
1.0000 [drp] | Freq: Once | OPHTHALMIC | Status: AC
  Administered 2021-07-22: 1 [drp] via OPHTHALMIC
  Filled 2021-07-22: qty 4

## 2021-07-22 MED ORDER — NEOMYCIN-POLYMYXIN-DEXAMETH 3.5-10000-0.1 OP SUSP
1.0000 [drp] | Freq: Once | OPHTHALMIC | Status: DC
  Filled 2021-07-22: qty 5

## 2021-07-22 MED ORDER — HYDROCODONE-ACETAMINOPHEN 5-325 MG PO TABS: 1.0000 | ORAL_TABLET | Freq: Four times a day (QID) | ORAL | 0 refills | Status: DC | PRN

## 2021-07-22 NOTE — ED Notes (Signed)
AC called to locate maxitrol ?

## 2021-07-22 NOTE — Discharge Instructions (Signed)
Avoid rubbing your eyes and do not put your contact in - use your glasses instead.  Apply the eye drop given 4 times daily.  You may take the hydrocodone given for pain relief.  This will make you drowsy - do not drive within 4 hours of taking this medication. ? ?As discussed,  have someone drive you to your appointment in the morning. ?

## 2021-07-22 NOTE — ED Provider Notes (Signed)
,  Medical screening examination/treatment/procedure(s) were conducted as a shared visit with non-physician practitioner(s) and myself.  I personally evaluated the patient during the encounter. ? ?Clinical Impression:  ? ?Final diagnoses:  ?None  ? ? ?Patient is a 51 year old female presenting with left eye pain and redness which came on this morning.  She has discomfort in her eye, she has consensual pain, she has good extraocular movements, the pupil seems to be a little bit sluggish on the left but it is responsive to light.  Her vision at this time is stable in that eye but she states that the pain is coming and going over long periods of time, it is not quick on and off.  She is has no flashes, no floaters, no significant discharge just some excessive watering.  Periorbital tissues are normal.  Fluorescein stain was negative, the patient does wear contact lenses. ? ?We will consult with ophthalmology, suspect iritis ? ? ?  ?Noemi Chapel, MD ?07/27/21 [phone] ? ?

## 2021-07-22 NOTE — ED Triage Notes (Signed)
Pt states she wears contact lenses and woke up this morning with pain and redness in L eye. Pt removes lenses once a week for cleaning, recently replaced this set. Believes something may have gotten under her contact and possibly scratched her eye. ?

## 2021-07-22 NOTE — ED Notes (Signed)
Dr. Alanda Slim called for PA Idol. ?

## 2021-07-22 NOTE — ED Provider Notes (Signed)
?[facility] ?Provider Note ? ? ?CSN: [id number] ?Arrival date & time: 07/22/21  1311 ? ?  ? ?History ? ?Chief Complaint  ?Patient presents with  ? Eye Pain  ? ? ?[name] is a 51 y.o. female with a history including hypothyroidism, RSD, GERD, tardive dyskinesia who wears extended wear contact lenses, disposes after 2 weeks but takes them out after the first week and washes them.  Slept with them in her eyes last night and had no symptoms when she went to bed, woke today with left eye pain, foreign body sensation and photophobia and clear tearing from the eye.  She states she had this happen 1 time before and had a speck of dirt beneath her contact lenses which caused a corneal abrasion.  Her pain is constant, worse with blinking and worse in bright lights.  She denies any visual acuity changes with this new complaint.  She has had no treatment prior to arrival.  She denies injury to the eye. ? ? ?The history is provided by the patient.  ? ?  ? ?Home Medications ?Prior to Admission medications   ?Medication Sig Start Date End Date Taking? Authorizing Provider  ?HYDROcodone-acetaminophen (NORCO/VICODIN) 5-325 MG tablet Take 1 tablet by mouth every 6 (six) hours as needed for severe pain. 07/22/21  Yes Braddock Servellon, Almyra Free, PA-C  ?valbenazine [facility]) 80 MG capsule Take one capsule by mouth daily 05/21/21   Tat, Eustace Quail, DO  ?albuterol (VENTOLIN HFA) 108 (90 Base) MCG/ACT inhaler Inhale into the lungs every 6 (six) hours as needed for wheezing or shortness of breath.    [provider]  ?ALPRAZolam Duanne Moron) 1 MG tablet Take 1 mg by mouth 4 (four) times daily as needed for anxiety.    [provider]  ?amoxicillin (AMOXIL) 875 MG tablet Take 1 tablet (875 mg total) by mouth 2 (two) times daily. 02/01/21   Jaynee Eagles, PA-C  ?baclofen (LIORESAL) 10 MG tablet Take 10 mg by mouth 4 (four) times daily as needed for muscle spasms.    [provider]  ?benztropine (COGENTIN) 1 MG  tablet Take 1 mg by mouth 2 (two) times daily.    [provider]  ?citalopram (CELEXA) 40 MG tablet Take 40 mg by mouth daily.    [provider]  ?ibuprofen (ADVIL) 800 MG tablet Take 800 mg by mouth every 8 (eight) hours as needed.    [provider]  ?lamoTRIgine (LAMICTAL) 25 MG tablet Take 50 mg by mouth 2 (two) times daily.  10/14/19   [provider]  ?levothyroxine (SYNTHROID) 125 MCG tablet Take 112 mcg by mouth daily before breakfast.    [provider]  ?meloxicam (MOBIC) 15 MG tablet Take 1 tablet (15 mg total) by mouth daily. 02/01/21   Jaynee Eagles, PA-C  ?OVER THE COUNTER MEDICATION Take 1 tablet by mouth 2 (two) times daily. OTC sinus medication    [provider]  ?pantoprazole (PROTONIX) 20 MG tablet Take 20 mg by mouth as needed.    [provider]  ?SUMAtriptan (IMITREX) 25 MG tablet Take 1 tablet (25 mg total) by mouth every 2 (two) hours as needed for migraine. May repeat in 2 hours if headache persists or recurs.  No more than 200 mg in 24 hours 07/08/20   Avegno, Darrelyn Hillock, FNP  ?traZODone (DESYREL) 50 MG tablet Take 50-100 mg by mouth at bedtime as needed for sleep.    [provider]  ?valbenazine (INGREZZA) 80 MG  capsule Take 1 capsule (80 mg total) by mouth daily. 07/04/21   Tat, Eustace Quail, DO  ?venlafaxine (EFFEXOR) 37.5 MG tablet Take 37.5 mg by mouth 2 (two) times daily.    [provider]  ?zolpidem (AMBIEN) 10 MG tablet Take 10 mg by mouth at bedtime as needed for sleep.    [provider]  ?   ? ?Allergies    ?Darvocet [propoxyphene n-acetaminophen] and Lyrica [pregabalin]   ? ?Review of Systems   ?Review of Systems  ?Constitutional:  Negative for chills and fever.  ?HENT:  Negative for congestion and sore throat.   ?Eyes:  Positive for photophobia, pain and redness. Negative for itching and visual disturbance.  ?Respiratory:  Negative for chest tightness and shortness of breath.   ?Cardiovascular:   Negative for chest pain.  ?Gastrointestinal:  Negative for abdominal pain, nausea and vomiting.  ?Genitourinary: Negative.   ?Musculoskeletal: Negative.   ?Skin: Negative.  Negative for rash and wound.  ?Neurological:  Negative for dizziness and headaches.  ?Psychiatric/Behavioral: Negative.    ?All other systems reviewed and are negative. ? ?Physical Exam ?Updated Vital Signs ?BP 131/76 (BP Location: Left Arm)   Pulse 78   Temp 98.7 ?F (37.1 ?C) (Oral)   Resp 16   Ht '5\' 10"'$  (1.778 m)   Wt 70.3 kg   SpO2 98%   BMI 22.24 kg/m?  ?Physical Exam ?Vitals and nursing note reviewed.  ?Constitutional:   ?   Appearance: She is well-developed.  ?HENT:  ?   Head: Normocephalic and atraumatic.  ?Eyes:  ?   General: Lids are normal. Vision grossly intact.  ?   Extraocular Movements: Extraocular movements intact.  ?   Conjunctiva/sclera:  ?   Left eye: Left conjunctiva is injected. No chemosis, exudate or hemorrhage. ?   Pupils:  ?   Left eye: Pupil is sluggish. No corneal abrasion or fluorescein uptake. Seidel exam negative. ?   Funduscopic exam:    ?   Left eye: Red reflex present. ?   Slit lamp exam: ?   Left eye: Anterior chamber quiet. Photophobia present. No corneal ulcer, foreign body or anterior chamber flares.  ?   Comments: Consensual pain left eye with light provocation to right eye.  ? ?Unable to assess visual acuity, does not have eyeglasses with her, contacts out.  She reports her vision is baseline.  ? ?IOP 8, 20, 19 left.  ?Cardiovascular:  ?   Rate and Rhythm: Normal rate.  ?Pulmonary:  ?   Effort: Pulmonary effort is normal.  ?Musculoskeletal:     ?   General: Normal range of motion.  ?   Cervical back: Normal range of motion.  ?Skin: ?   General: Skin is warm and dry.  ?Neurological:  ?   Mental Status: She is alert.  ? ? ?ED Results / Procedures / Treatments   ?Labs ?(all labs ordered are listed, but only abnormal results are displayed) ?Labs Reviewed - No data to display ? ?EKG ?None ? ?Radiology ?No  results found. ? ?Procedures ?Procedures  ? ? ?Medications Ordered in ED ?Medications  ?ibuprofen (ADVIL) tablet 800 mg (has no administration in time range)  ?neomycin-polymyxin b-dexamethasone (MAXITROL) ophthalmic suspension 1 drop (has no administration in time range)  ?tetracaine (PONTOCAINE) 0.5 % ophthalmic solution 1 drop (1 drop Right Eye Given 07/22/21 1354)  ?fluorescein ophthalmic strip 1 strip (1 strip Right Eye Given 07/22/21 1447)  ? ? ?ED Course/ Medical Decision Making/ A&P ?  ?                        ?  Medical Decision Making ?Pt with left eye pain, not improving with tetracaine application, consensual pain to light, sluggish pupil, normal pressure, suggesting acute iritis, no evidence of corneal abrasion, glaucoma, pt denies trauma.   ? ?Amount and/or Complexity of Data Reviewed ?Discussion of management or test interpretation with external provider(s): Pt discussed with Dr Alanda Slim of ophthalmology who recommends maxatrol 4x daily, f/u in his office tomorrow at 9 am.  Pt aware and agreeable to this plan.  She will have someone drive her.  ? ?Risk ?Prescription drug management. ? ? ? ? ? ? ? ? ? ? ?Final Clinical Impression(s) / ED Diagnoses ?Final diagnoses:  ?Left eye pain  ?Iritis of left eye  ? ? ?Rx / DC Orders ?ED Discharge Orders   ? ?      Ordered  ?  HYDROcodone-acetaminophen (NORCO/VICODIN) 5-325 MG tablet  Every 6 hours PRN       ? 07/22/21 1532  ? ?  ?  ? ?  ? ? ?  ?Evalee Jefferson, PA-C ?07/22/21 1537 ? ?  ?Noemi Chapel, MD ?07/27/21 [phone] ? ?

## 2021-07-22 NOTE — ED Notes (Signed)
Pt unable to see eye chart due to pain in L eye and not able to open R without L eye hurting. Contact lenses were removed prior to arriving in ED, pt does not have them with her at this time. ?

## 2021-07-23 ENCOUNTER — Other Ambulatory Visit: Payer: Self-pay | Admitting: Neurology

## 2021-07-23 DIAGNOSIS — G2401 Drug induced subacute dyskinesia: Secondary | ICD-10-CM

## 2021-07-26 DIAGNOSIS — H209 Unspecified iridocyclitis: Secondary | ICD-10-CM | POA: Diagnosis not present

## 2021-07-26 DIAGNOSIS — Z6822 Body mass index (BMI) 22.0-22.9, adult: Secondary | ICD-10-CM | POA: Diagnosis not present

## 2021-07-26 DIAGNOSIS — I10 Essential (primary) hypertension: Secondary | ICD-10-CM | POA: Diagnosis not present

## 2021-12-05 ENCOUNTER — Other Ambulatory Visit: Payer: Self-pay

## 2021-12-05 ENCOUNTER — Encounter: Payer: Self-pay | Admitting: Emergency Medicine

## 2021-12-05 ENCOUNTER — Ambulatory Visit
Admission: EM | Admit: 2021-12-05 | Discharge: 2021-12-05 | Disposition: A | Discharge status: Home / Self Care | Payer: Medicare HMO | Attending: Nurse Practitioner | Admitting: Nurse Practitioner

## 2021-12-05 DIAGNOSIS — S76211A Strain of adductor muscle, fascia and tendon of right thigh, initial encounter: Secondary | ICD-10-CM | POA: Diagnosis not present

## 2021-12-05 DIAGNOSIS — M5431 Sciatica, right side: Secondary | ICD-10-CM

## 2021-12-05 MED ORDER — DEXAMETHASONE SODIUM PHOSPHATE 10 MG/ML IJ SOLN
10.0000 mg | INTRAMUSCULAR | Status: AC
  Administered 2021-12-05: 10 mg via INTRAMUSCULAR

## 2021-12-05 MED ORDER — PREDNISONE 10 MG (21) PO TBPK: ORAL_TABLET | Freq: Every day | ORAL | 0 refills | Status: AC

## 2021-12-05 MED ORDER — KETOROLAC TROMETHAMINE 30 MG/ML IJ SOLN
30.0000 mg | Freq: Once | INTRAMUSCULAR | Status: AC
  Administered 2021-12-05: 30 mg via INTRAMUSCULAR

## 2021-12-05 NOTE — ED Provider Notes (Signed)
[facility]    CSN: [id number] Arrival date & time: 12/05/21  1022      History   Chief Complaint Chief Complaint  Patient presents with   Back Pain    HPI [name] is a 51 y.o. female.   The history is provided by the patient.   Patient presents for complaints of right-sided low back pain and right upper thigh/groin pain.  Patient states symptoms started 3 days ago after she was playing with her dog.  She states that she rested and states when she went to take off she had pain that she described as "stabbing" in the right upper thigh/groin.  She states that over the past several days, pain has worsened.  She states pain worsens with bearing weight, bending forward, bending, squatting, stooping, and most movement of the right leg.  She also endorses swelling to the right lower back.  She denies fever, chills, chest pain, abdominal pain, loss of bowel or bladder function, numbness, tingling, or radiation into the right lower extremity.  Patient states that she does have a history of back surgery with fusion.  She states that she has been taking Advil for her symptoms.  Past Medical History:  Diagnosis Date   Anxiety    Chronic back pain    Chronic neck pain    Complication of anesthesia    pt states her O2 saturation drops after surgery   GERD (gastroesophageal reflux disease)    Headache    migraines- last one 2 months ago   Hypercholesteremia    Hypothyroidism    Kidney stones    Osteoporosis    Reflex sympathetic dystrophy    Tardive dyskinesia    Thyroid disease     Patient Active Problem List   Diagnosis Date Noted   Bilateral carpal tunnel syndrome 78/29/5621   Follicular neoplasm of thyroid 03/14/2014    Past Surgical History:  Procedure Laterality Date   ABDOMINAL HYSTERECTOMY     partial   APPENDECTOMY     BACK SURGERY     plates and screws   BREAST SURGERY Left    fibrocystic tumor   CARPAL TUNNEL RELEASE Right 03/22/2019    Procedure: RIGHT CARPAL TUNNEL RELEASE;  Surgeon: Leanora Cover, MD;  Location: [facility];  Service: Orthopedics;  Laterality: Right;  NO LOCAL   CHOLECYSTECTOMY     CYST EXCISION Left    foot   EAR CYST EXCISION Right 01/27/2013   Procedure: EXCISION CYST ON SCALP ;  Surgeon: Jamesetta So, MD;  Location: [facility];  Service: General;  Laterality: Right;   EAR CYST EXCISION Right 01/27/2013   Procedure: CYST REMOVAL right axilla;  Surgeon: Jamesetta So, MD;  Location: [facility];  Service: General;  Laterality: Right;   KNEE ARTHROSCOPY Left    NECK SURGERY     plate in neck   SALPINGOOPHORECTOMY Right    STRABISMUS SURGERY Bilateral    THYROIDECTOMY N/A 03/14/2014   Procedure: TOTAL THYROIDECTOMY;  Surgeon: Jamesetta So, MD;  Location: [facility];  Service: General;  Laterality: N/A;    OB History   No obstetric history on file.      Home Medications    Prior to Admission medications   Medication Sig Start Date End Date Taking? Authorizing Provider  predniSONE (STERAPRED UNI-PAK 21 TAB) 10 MG (21) TBPK tablet Take by mouth daily for 6 days. Take 6 tablets with breakfast on day 1. Take 5 tablets with breakfast  on day 2. Take 4 tablets with breakfast on day 3. Take 3 tablets with breakfast on day 4. Take 2 tablets with breakfast on day 5. Take 1 tablet with breakfast on day 6. 12/05/21 12/11/21 Yes Deshante Cassell-Warren, Alda Lea, NP  valbenazine [facility]) 80 MG capsule Take one capsule by mouth daily 05/21/21   Tat, Eustace Quail, DO  albuterol (VENTOLIN HFA) 108 (90 Base) MCG/ACT inhaler Inhale into the lungs every 6 (six) hours as needed for wheezing or shortness of breath.    [provider]  ALPRAZolam Duanne Moron) 1 MG tablet Take 1 mg by mouth 4 (four) times daily as needed for anxiety.    [provider]  amoxicillin (AMOXIL) 875 MG tablet Take 1 tablet (875 mg total) by mouth 2 (two) times daily. 02/01/21   Jaynee Eagles, PA-C  baclofen (LIORESAL) 10 MG tablet Take 10 mg  by mouth 4 (four) times daily as needed for muscle spasms.    [provider]  benztropine (COGENTIN) 1 MG tablet Take 1 mg by mouth 2 (two) times daily.    [provider]  citalopram (CELEXA) 40 MG tablet Take 40 mg by mouth daily.    [provider]  HYDROcodone-acetaminophen (NORCO/VICODIN) 5-325 MG tablet Take 1 tablet by mouth every 6 (six) hours as needed for severe pain. 07/22/21   Evalee Jefferson, PA-C  ibuprofen (ADVIL) 800 MG tablet Take 800 mg by mouth every 8 (eight) hours as needed.    [provider]  lamoTRIgine (LAMICTAL) 25 MG tablet Take 50 mg by mouth 2 (two) times daily.  10/14/19   [provider]  levothyroxine (SYNTHROID) 125 MCG tablet Take 112 mcg by mouth daily before breakfast.    [provider]  meloxicam (MOBIC) 15 MG tablet Take 1 tablet (15 mg total) by mouth daily. 02/01/21   Jaynee Eagles, PA-C  OVER THE COUNTER MEDICATION Take 1 tablet by mouth 2 (two) times daily. OTC sinus medication    [provider]  pantoprazole (PROTONIX) 20 MG tablet Take 20 mg by mouth as needed.    [provider]  SUMAtriptan (IMITREX) 25 MG tablet Take 1 tablet (25 mg total) by mouth every 2 (two) hours as needed for migraine. May repeat in 2 hours if headache persists or recurs.  No more than 200 mg in 24 hours 07/08/20   Avegno, Darrelyn Hillock, FNP  traZODone (DESYREL) 50 MG tablet Take 50-100 mg by mouth at bedtime as needed for sleep.    [provider]  valbenazine (INGREZZA) 80 MG capsule Take 1 capsule (80 mg total) by mouth daily. 07/04/21   Tat, Eustace Quail, DO  venlafaxine (EFFEXOR) 37.5 MG tablet Take 37.5 mg by mouth 2 (two) times daily.    [provider]  zolpidem (AMBIEN) 10 MG tablet Take 10 mg by mouth at bedtime as needed for sleep.    [provider]    Family History Family History  Problem Relation Age of Onset   Breast cancer Mother    Parkinson's disease Father    Depression  Sister     Social History Social History   Tobacco Use   Smoking status: Every Day    Packs/day: 1.00    Years: 20.00    Total pack years: 20.00    Types: Cigarettes   Smokeless tobacco: Never  Substance Use Topics   Alcohol use: Yes    Comment: occasionally   Drug use: No    Comment: prescription Rx  Allergies   Darvocet [propoxyphene n-acetaminophen] and Lyrica [pregabalin]   Review of Systems Review of Systems Per HPI  Physical Exam Triage Vital Signs ED Triage Vitals [12/05/21 1242]  Enc Vitals Group     BP 116/76     Pulse Rate 78     Resp 20     Temp 98 F (36.7 C)     Temp Source Oral     SpO2 100 %     Weight      Height      Head Circumference      Peak Flow      Pain Score 10     Pain Loc      Pain Edu?      Excl. in [place]?    No data found.  Updated Vital Signs BP 116/76 (BP Location: Right Arm)   Pulse 78   Temp 98 F (36.7 C) (Oral)   Resp 20   SpO2 100%   Visual Acuity Right Eye Distance:   Left Eye Distance:   Bilateral Distance:    Right Eye Near:   Left Eye Near:    Bilateral Near:     Physical Exam Vitals and nursing note reviewed.  Constitutional:      General: She is in acute distress (Due to right upper thigh/groin pain).     Appearance: Normal appearance.  Eyes:     Extraocular Movements: Extraocular movements intact.     Conjunctiva/sclera: Conjunctivae normal.     Pupils: Pupils are equal, round, and reactive to light.  Cardiovascular:     Rate and Rhythm: Normal rate and regular rhythm.     Pulses: Normal pulses.     Heart sounds: Normal heart sounds.  Pulmonary:     Effort: Pulmonary effort is normal.     Breath sounds: Normal breath sounds.  Abdominal:     General: Bowel sounds are normal.     Palpations: Abdomen is soft.     Tenderness: There is no abdominal tenderness.  Musculoskeletal:     Cervical back: Normal range of motion.     Lumbar back: Spasms (Right sciatic nerve) and tenderness (Right  sciatic nerve) present. No swelling. Decreased range of motion. Positive right straight leg raise test.     Right hip: Tenderness present. No deformity. Decreased range of motion.     Right upper leg: Tenderness present. No swelling, edema or deformity.  Lymphadenopathy:     Cervical: No cervical adenopathy.  Skin:    General: Skin is warm and dry.  Neurological:     General: No focal deficit present.     Mental Status: She is alert and oriented to person, place, and time.  Psychiatric:        Behavior: Behavior normal.      UC Treatments / Results  Labs (all labs ordered are listed, but only abnormal results are displayed) Labs Reviewed - No data to display  EKG   Radiology No results found.  Procedures Procedures (including critical care time)  Medications Ordered in UC Medications  dexamethasone (DECADRON) injection 10 mg (10 mg Intramuscular Given 12/05/21 1324)  ketorolac (TORADOL) 30 MG/ML injection 30 mg (30 mg Intramuscular Given 12/05/21 1324)    Initial Impression / Assessment and Plan / UC Course  I have reviewed the triage vital signs and the nursing notes.  Pertinent labs & imaging results that were available during my care of the patient were reviewed by me and considered in my medical decision making (  see chart for details).  Patient presents for complaints of right groin/upper thigh pain and right-sided low back pain.  Symptoms have been present for the past 3 days.  On exam, patient has decreased range of motion with all movements of the right hip.  She also has tenderness to the right sciatic nerve into the right groin region.  Symptoms appear to be consistent with right-sided sciatica and right groin strain.  Decadron 10 mg IM and Toradol 30 mg IM injections given in the clinic today.  Will start patient on prednisone taper to help with inflammation.  Patient advised to continue the muscle relaxer that she currently has at home.  Advised patient to take Tylenol  while she is taking prednisone.  Patient was given strict indications of when to go to the emergency department.  Patient advised to follow-up with her primary care physician if symptoms fail to improve. Final Clinical Impressions(s) / UC Diagnoses   Final diagnoses:  Strain of right groin  Right sided sciatica     Discharge Instructions      Take medication as prescribed. May take over-the-counter Tylenol as needed for breakthrough pain while you are taking the prednisone.  Do not take ibuprofen when you are taking the prednisone. Apply ice or heat as needed.  Apply ice for pain or swelling, heat for spasm or stiffness.  Apply for 20 minutes, remove for 1 hour, then repeat as much as possible. Gentle stretching and range of motion exercises while symptoms persist. Go to the emergency department immediately if you develop loss of bowel or bladder function, inability to bear weight, weakness in the lower extremities, or other concerns. If your symptoms do not improve within the next 2 to 4 weeks, please follow-up with your primary care physician for further evaluation.     ED Prescriptions     Medication Sig Dispense Auth. Provider   predniSONE (STERAPRED UNI-PAK 21 TAB) 10 MG (21) TBPK tablet Take by mouth daily for 6 days. Take 6 tablets with breakfast on day 1. Take 5 tablets with breakfast on day 2. Take 4 tablets with breakfast on day 3. Take 3 tablets with breakfast on day 4. Take 2 tablets with breakfast on day 5. Take 1 tablet with breakfast on day 6. 21 tablet Zaeden Lastinger-Warren, Alda Lea, NP      PDMP not reviewed this encounter.   Tish Men, NP 12/05/21 1344

## 2021-12-05 NOTE — ED Triage Notes (Signed)
Pt reports right groin pain since Sunday. Pt reports was playing with dog on Sunday when initial pain episode started. Pt reports rested and reports pain returned this morning when tried to walk to bathroom. Pt able to bear weight but with increased pain.

## 2021-12-05 NOTE — Discharge Instructions (Addendum)
Take medication as prescribed. May take over-the-counter Tylenol as needed for breakthrough pain while you are taking the prednisone.  Do not take ibuprofen when you are taking the prednisone. Apply ice or heat as needed.  Apply ice for pain or swelling, heat for spasm or stiffness.  Apply for 20 minutes, remove for 1 hour, then repeat as much as possible. Gentle stretching and range of motion exercises while symptoms persist. Go to the emergency department immediately if you develop loss of bowel or bladder function, inability to bear weight, weakness in the lower extremities, or other concerns. If your symptoms do not improve within the next 2 to 4 weeks, please follow-up with your primary care physician for further evaluation.

## 2021-12-21 ENCOUNTER — Encounter (HOSPITAL_COMMUNITY): Payer: Self-pay

## 2021-12-21 ENCOUNTER — Emergency Department (HOSPITAL_COMMUNITY)
Admission: EM | Admit: 2021-12-21 | Discharge: 2021-12-22 | Disposition: A | Discharge status: Home / Self Care | Payer: Medicare HMO | Attending: Emergency Medicine | Admitting: Emergency Medicine

## 2021-12-21 ENCOUNTER — Other Ambulatory Visit: Payer: Self-pay

## 2021-12-21 DIAGNOSIS — F17210 Nicotine dependence, cigarettes, uncomplicated: Secondary | ICD-10-CM | POA: Insufficient documentation

## 2021-12-21 DIAGNOSIS — Z20822 Contact with and (suspected) exposure to covid-19: Secondary | ICD-10-CM | POA: Insufficient documentation

## 2021-12-21 DIAGNOSIS — E039 Hypothyroidism, unspecified: Secondary | ICD-10-CM | POA: Diagnosis not present

## 2021-12-21 DIAGNOSIS — R509 Fever, unspecified: Secondary | ICD-10-CM | POA: Diagnosis present

## 2021-12-21 DIAGNOSIS — B349 Viral infection, unspecified: Secondary | ICD-10-CM | POA: Diagnosis not present

## 2021-12-21 LAB — GROUP A STREP BY PCR: Group A Strep by PCR: NOT DETECTED

## 2021-12-21 LAB — RESP PANEL BY RT-PCR (FLU A&B, COVID) ARPGX2
Influenza A by PCR: NEGATIVE
Influenza B by PCR: NEGATIVE
SARS Coronavirus 2 by RT PCR: NEGATIVE

## 2021-12-21 MED ORDER — DEXAMETHASONE SODIUM PHOSPHATE 10 MG/ML IJ SOLN
10.0000 mg | Freq: Once | INTRAMUSCULAR | Status: AC
  Administered 2021-12-22: 10 mg via INTRAMUSCULAR
  Filled 2021-12-21: qty 1

## 2021-12-21 NOTE — ED Triage Notes (Signed)
Patient states earlier this week she started to no feel well. States she has a fever, sore throat, decreased appetite, and fatigue

## 2021-12-21 NOTE — Discharge Instructions (Signed)
You were evaluated in the Emergency Department and after careful evaluation, we did not find any emergent condition requiring admission or further testing in the hospital.  Your exam/testing today is overall reassuring.  Symptoms likely due to a viral illness.  COVID test was negative, strep test was negative.  Recommend continued fluids, rest, Tylenol or Motrin for pain.  Please return to the Emergency Department if you experience any worsening of your condition.   Thank you for allowing [place] to be a part of your care.

## 2021-12-21 NOTE — ED Provider Notes (Signed)
[facility] Emergency Department Provider Note MRN:  [id number]  Arrival date & time: 12/21/21     Chief Complaint   Fever and Sore Throat   History of Present Illness   [name] is a 51 y.o. year-old female with a history of GERD presenting to the ED with chief complaint of fever and sore throat.  Fever, sore throat, body aches for the past 4 days.  No chest pain, no shortness of breath, no abdominal pain, no nausea vomiting or diarrhea, no rash.  Review of Systems  A thorough review of systems was obtained and all systems are negative except as noted in the HPI and PMH.   Patient's Health History    Past Medical History:  Diagnosis Date   Anxiety    Chronic back pain    Chronic neck pain    Complication of anesthesia    pt states her O2 saturation drops after surgery   GERD (gastroesophageal reflux disease)    Headache    migraines- last one 2 months ago   Hypercholesteremia    Hypothyroidism    Kidney stones    Osteoporosis    Reflex sympathetic dystrophy    Tardive dyskinesia    Thyroid disease     Past Surgical History:  Procedure Laterality Date   ABDOMINAL HYSTERECTOMY     partial   APPENDECTOMY     BACK SURGERY     plates and screws   BREAST SURGERY Left    fibrocystic tumor   CARPAL TUNNEL RELEASE Right 03/22/2019   Procedure: RIGHT CARPAL TUNNEL RELEASE;  Surgeon: Leanora Cover, MD;  Location: [place];  Service: Orthopedics;  Laterality: Right;  NO LOCAL   CHOLECYSTECTOMY     CYST EXCISION Left    foot   EAR CYST EXCISION Right 01/27/2013   Procedure: EXCISION CYST ON SCALP ;  Surgeon: Jamesetta So, MD;  Location: [facility];  Service: General;  Laterality: Right;   EAR CYST EXCISION Right 01/27/2013   Procedure: CYST REMOVAL right axilla;  Surgeon: Jamesetta So, MD;  Location: [facility];  Service: General;  Laterality: Right;   KNEE ARTHROSCOPY Left    NECK SURGERY     plate in neck    SALPINGOOPHORECTOMY Right    STRABISMUS SURGERY Bilateral    THYROIDECTOMY N/A 03/14/2014   Procedure: TOTAL THYROIDECTOMY;  Surgeon: Jamesetta So, MD;  Location: [facility];  Service: General;  Laterality: N/A;    Family History  Problem Relation Age of Onset   Breast cancer Mother    Parkinson's disease Father    Depression Sister     Social History   Socioeconomic History   Marital status: Widowed    Spouse name: Not on file   Number of children: Not on file   Years of education: Not on file   Highest education level: Not on file  Occupational History   Not on file  Tobacco Use   Smoking status: Every Day    Packs/day: 1.00    Years: 20.00    Total pack years: 20.00    Types: Cigarettes   Smokeless tobacco: Never  Substance and Sexual Activity   Alcohol use: Yes    Comment: occasionally   Drug use: No    Comment: prescription Rx   Sexual activity: Never  Other Topics Concern   Not on file  Social History Narrative   Lives alone.   Social Determinants of Radio broadcast assistant  Strain: Not on file  Food Insecurity: Not on file  Transportation Needs: Not on file  Physical Activity: Not on file  Stress: Not on file  Social Connections: Not on file  Intimate Partner Violence: Not on file     Physical Exam   Vitals:   12/21/21 1850  BP: 126/78  Pulse: 91  Resp: 18  Temp: 98.8 F (37.1 C)  SpO2: 97%    CONSTITUTIONAL: Well-appearing, NAD NEURO/PSYCH:  Alert and oriented x 3, no focal deficits EYES:  eyes equal and reactive ENT/NECK:  no LAD, no JVD CARDIO: Regular rate, well-perfused, normal S1 and S2 PULM:  CTAB no wheezing or rhonchi GI/GU:  non-distended, non-tender MSK/SPINE:  No gross deformities, no edema SKIN:  no rash, atraumatic   *Additional and/or pertinent findings included in MDM below  Diagnostic and Interventional Summary    EKG Interpretation  Date/Time:    Ventricular Rate:    PR Interval:    QRS Duration:   QT  Interval:    QTC Calculation:   R Axis:     Text Interpretation:         Labs Reviewed  RESP PANEL BY RT-PCR (FLU A&B, COVID) [id number]  GROUP A STREP BY PCR    No orders to display    Medications  dexamethasone (DECADRON) injection 10 mg (has no administration in time range)     Procedures  /  Critical Care Procedures  ED Course and Medical Decision Making  Initial Impression and Ddx Mild viral/URI symptoms.  Normal vital signs.  No meningismus.  Lungs are clear, no increased work of breathing.  Posterior oropharynx mildly erythematous, no signs of abscess.  Doubt emergent process, doubt significant bacterial infection.  COVID considered.  Strep considered.  Past medical/surgical history that increases complexity of ED encounter: Tobacco use history  Interpretation of Diagnostics COVID-negative, strep negative  Patient Reassessment and Ultimate Disposition/Management     Appropriate for discharge.  Patient is anxious to get back to work, providing with a dose of steroids to aid with symptoms.  Patient management required discussion with the following services or consulting groups:  None  Complexity of Problems Addressed Acute illness or injury that poses threat of life of bodily function  Additional Data Reviewed and Analyzed Further history obtained from: None  Additional Factors Impacting ED Encounter Risk None  Barth Kirks. Sedonia Small, [place] [email or web address]  Final Clinical Impressions(s) / ED Diagnoses     ICD-10-CM   1. Viral illness  B34.9       ED Discharge Orders     None        Discharge Instructions Discussed with and Provided to Patient:    Discharge Instructions      You were evaluated in the Emergency Department and after careful evaluation, we did not find any emergent condition requiring admission or further testing in the hospital.  Your exam/testing today is overall reassuring.   Symptoms likely due to a viral illness.  COVID test was negative, strep test was negative.  Recommend continued fluids, rest, Tylenol or Motrin for pain.  Please return to the Emergency Department if you experience any worsening of your condition.   Thank you for allowing [place] to be a part of your care.      Maudie Flakes, MD 12/21/21 [phone]

## 2021-12-22 DIAGNOSIS — B349 Viral infection, unspecified: Secondary | ICD-10-CM | POA: Diagnosis not present

## 2021-12-31 DIAGNOSIS — E063 Autoimmune thyroiditis: Secondary | ICD-10-CM | POA: Diagnosis not present

## 2021-12-31 DIAGNOSIS — E559 Vitamin D deficiency, unspecified: Secondary | ICD-10-CM | POA: Diagnosis not present

## 2021-12-31 DIAGNOSIS — G2401 Drug induced subacute dyskinesia: Secondary | ICD-10-CM | POA: Diagnosis not present

## 2021-12-31 DIAGNOSIS — F1120 Opioid dependence, uncomplicated: Secondary | ICD-10-CM | POA: Diagnosis not present

## 2021-12-31 DIAGNOSIS — G9059 Complex regional pain syndrome I of other specified site: Secondary | ICD-10-CM | POA: Diagnosis not present

## 2021-12-31 DIAGNOSIS — F419 Anxiety disorder, unspecified: Secondary | ICD-10-CM | POA: Diagnosis not present

## 2021-12-31 DIAGNOSIS — Z6820 Body mass index (BMI) 20.0-20.9, adult: Secondary | ICD-10-CM | POA: Diagnosis not present

## 2022-01-16 ENCOUNTER — Other Ambulatory Visit (HOSPITAL_COMMUNITY): Payer: Self-pay | Admitting: Internal Medicine

## 2022-01-16 DIAGNOSIS — N6310 Unspecified lump in the right breast, unspecified quadrant: Secondary | ICD-10-CM

## 2022-01-16 DIAGNOSIS — I10 Essential (primary) hypertension: Secondary | ICD-10-CM | POA: Diagnosis not present

## 2022-01-16 DIAGNOSIS — Z6822 Body mass index (BMI) 22.0-22.9, adult: Secondary | ICD-10-CM | POA: Diagnosis not present

## 2022-01-16 DIAGNOSIS — E063 Autoimmune thyroiditis: Secondary | ICD-10-CM | POA: Diagnosis not present

## 2022-01-24 ENCOUNTER — Ambulatory Visit (HOSPITAL_COMMUNITY)
Admission: RE | Admit: 2022-01-24 | Discharge: 2022-01-24 | Disposition: A | Discharge status: Home / Self Care | Payer: Medicare HMO | Source: Ambulatory Visit | Attending: Internal Medicine | Admitting: Internal Medicine

## 2022-01-24 ENCOUNTER — Encounter (HOSPITAL_COMMUNITY): Payer: Self-pay

## 2022-01-24 DIAGNOSIS — R922 Inconclusive mammogram: Secondary | ICD-10-CM | POA: Insufficient documentation

## 2022-01-24 DIAGNOSIS — R921 Mammographic calcification found on diagnostic imaging of breast: Secondary | ICD-10-CM | POA: Diagnosis not present

## 2022-01-24 DIAGNOSIS — N6310 Unspecified lump in the right breast, unspecified quadrant: Secondary | ICD-10-CM

## 2022-01-28 DIAGNOSIS — E063 Autoimmune thyroiditis: Secondary | ICD-10-CM | POA: Diagnosis not present

## 2022-03-11 ENCOUNTER — Other Ambulatory Visit (HOSPITAL_COMMUNITY): Payer: Self-pay

## 2022-03-13 ENCOUNTER — Other Ambulatory Visit (HOSPITAL_COMMUNITY): Payer: Self-pay

## 2022-03-18 DIAGNOSIS — E063 Autoimmune thyroiditis: Secondary | ICD-10-CM | POA: Diagnosis not present

## 2022-03-22 ENCOUNTER — Encounter (HOSPITAL_COMMUNITY): Payer: Self-pay

## 2022-03-22 ENCOUNTER — Emergency Department (HOSPITAL_COMMUNITY)
Admission: EM | Admit: 2022-03-22 | Discharge: 2022-03-22 | Disposition: A | Discharge status: Home / Self Care | Payer: Medicare HMO | Attending: Emergency Medicine | Admitting: Emergency Medicine

## 2022-03-22 ENCOUNTER — Other Ambulatory Visit: Payer: Self-pay

## 2022-03-22 DIAGNOSIS — E063 Autoimmune thyroiditis: Secondary | ICD-10-CM | POA: Insufficient documentation

## 2022-03-22 DIAGNOSIS — R42 Dizziness and giddiness: Secondary | ICD-10-CM | POA: Insufficient documentation

## 2022-03-22 DIAGNOSIS — R110 Nausea: Secondary | ICD-10-CM | POA: Insufficient documentation

## 2022-03-22 LAB — URINALYSIS, ROUTINE W REFLEX MICROSCOPIC
Bacteria, UA: NONE SEEN
Bilirubin Urine: NEGATIVE
Glucose, UA: NEGATIVE mg/dL
Ketones, ur: NEGATIVE mg/dL
Leukocytes,Ua: NEGATIVE
Nitrite: NEGATIVE
Protein, ur: NEGATIVE mg/dL
Specific Gravity, Urine: 1.006 (ref 1.005–1.030)
pH: 6 (ref 5.0–8.0)

## 2022-03-22 LAB — BASIC METABOLIC PANEL
Anion gap: 8 (ref 5–15)
BUN: 13 mg/dL (ref 6–20)
CO2: 24 mmol/L (ref 22–32)
Calcium: 8.7 mg/dL — ABNORMAL LOW (ref 8.9–10.3)
Chloride: 106 mmol/L (ref 98–111)
Creatinine, Ser: 0.91 mg/dL (ref 0.44–1.00)
GFR, Estimated: >60 mL/min (ref >60)
Glucose, Bld: 107 mg/dL — ABNORMAL HIGH (ref 70–99)
Potassium: 4.3 mmol/L (ref 3.5–5.1)
Sodium: 138 mmol/L (ref 135–145)

## 2022-03-22 LAB — CBC
HCT: 40.1 % (ref 36.0–46.0)
Hemoglobin: 13.3 g/dL (ref 12.0–15.0)
MCH: 30.2 pg (ref 26.0–34.0)
MCHC: 33.2 g/dL (ref 30.0–36.0)
MCV: 90.9 fL (ref 80.0–100.0)
Platelets: 295 10*3/uL (ref 150–400)
RBC: 4.41 MIL/uL (ref 3.87–5.11)
RDW: 12.6 % (ref 11.5–15.5)
WBC: 6.8 10*3/uL (ref 4.0–10.5)
nRBC: 0 % (ref 0.0–0.2)

## 2022-03-22 LAB — CBG MONITORING, ED: Glucose-Capillary: 110 mg/dL — ABNORMAL HIGH (ref 70–99)

## 2022-03-22 LAB — POC URINE PREG, ED: Preg Test, Ur: NEGATIVE

## 2022-03-22 LAB — TSH: TSH: 0.377 u[IU]/mL (ref 0.350–4.500)

## 2022-03-22 LAB — T4, FREE: Free T4: 0.85 ng/dL (ref 0.61–1.12)

## 2022-03-22 MED ORDER — MECLIZINE HCL 25 MG PO TABS: 25.0000 mg | ORAL_TABLET | Freq: Three times a day (TID) | ORAL | 0 refills | Status: DC | PRN

## 2022-03-22 MED ORDER — SCOPOLAMINE 1 MG/3DAYS TD PT72
1.0000 | MEDICATED_PATCH | TRANSDERMAL | Status: DC
  Administered 2022-03-22: 1.5 mg via TRANSDERMAL
  Filled 2022-03-22 (×2): qty 1

## 2022-03-22 MED ORDER — SODIUM CHLORIDE 0.9 % IV BOLUS
1000.0000 mL | Freq: Once | INTRAVENOUS | Status: AC
  Administered 2022-03-22: 1000 mL via INTRAVENOUS

## 2022-03-22 MED ORDER — MECLIZINE HCL 12.5 MG PO TABS
25.0000 mg | ORAL_TABLET | Freq: Once | ORAL | Status: AC
  Administered 2022-03-22: 25 mg via ORAL
  Filled 2022-03-22: qty 2

## 2022-03-22 NOTE — ED Notes (Signed)
POC preg was negative

## 2022-03-22 NOTE — Discharge Instructions (Signed)
Please follow-up with your primary care doctor, and perform the Epley maneuver to help with your vertigo.  If you have severe nausea, vomiting that is intractable or severe headache, or confusion please return to the ER.

## 2022-03-22 NOTE — ED Provider Triage Note (Signed)
Emergency Medicine Provider Triage Evaluation Note  [name] , a 50 y.o. female  was evaluated in triage.  Pt complains of persistent dizzinessx1 day. Associated nausea. No vomiting/diarrhea. Reports EMS came out yesterday and said she had low BP and was dehydrated. She has hx of vertigo but states this is different and she feels very weak. States her thyroid is "messed up" and they've been trying to figure it out. Denies head trauma, blood thinner.  Review of Systems  Positive: Nausea, dizziness Negative: Chest pain, SOB  Physical Exam  BP 109/74   Pulse 85   Temp 98.2 F (36.8 C) (Oral)   Resp 17   Ht '5\' 11"'$  (1.803 m)   Wt 61.2 kg   SpO2 98%   BMI 18.83 kg/m  Gen:   Awake, no distress   Resp:  Normal effort  MSK:   Moves extremities without difficulty  Other:  +tardive dyskinesia (per pt baseline for her), no acute neurodeficits, dry mouth  Medical Decision Making  Medically screening exam initiated at 1:28 PM.  Appropriate orders placed.  [name] was informed that the remainder of the evaluation will be completed by another provider, this initial triage assessment does not replace that evaluation, and the importance of remaining in the ED until their evaluation is complete.     Osvaldo Shipper, [place] 03/22/22 1342

## 2022-03-22 NOTE — ED Provider Notes (Signed)
[facility] EMERGENCY DEPARTMENT Provider Note   CSN: [id number] Arrival date & time: 03/22/22  1304     History  Chief Complaint  Patient presents with   Dizziness    [name] is a 51 y.o. female, hx of autoimmune thyroiditis, migraines, who presents to the ED 2/2 to persistent dizzinessx1 day. She states she feels very weak and unwell. Called EMS yesterday given symptoms and was told her BP was low and she was dehydrated.  She states since then she has not really eaten or drinking much, and that she just feels very weak.  Denies any kind of fevers, chills, vomiting, diarrhea.  Does endorse nausea.  History of vertigo but she states this feels actually very similar.  Additionally notes that her thyroid has been off, and they have been trying to help with her meds, and since then she is just felt very weak and out of it.  Has not hit her head denies any chest pain, shortness of breath, and is not on any blood thinners.     Home Medications Prior to Admission medications   Medication Sig Start Date End Date Taking? Authorizing Provider  albuterol (VENTOLIN HFA) 108 (90 Base) MCG/ACT inhaler Inhale into the lungs every 6 (six) hours as needed for wheezing or shortness of breath.   Yes [provider]  ALPRAZolam Duanne Moron) 1 MG tablet Take 1 mg by mouth 4 (four) times daily as needed for anxiety.   Yes [provider]  baclofen (LIORESAL) 10 MG tablet Take 10 mg by mouth 4 (four) times daily as needed for muscle spasms.   Yes [provider]  citalopram (CELEXA) 40 MG tablet Take 40 mg by mouth daily.   Yes [provider]  ibuprofen (ADVIL) 800 MG tablet Take 800 mg by mouth every 8 (eight) hours as needed.   Yes [provider]  levothyroxine (SYNTHROID) 50 MCG tablet Take 50 mcg by mouth daily. 03/19/22  Yes [provider]  levothyroxine (SYNTHROID) 75 MCG tablet Take 75 mcg by mouth daily. 02/28/22  Yes [provider]  meclizine (ANTIVERT) 25 MG tablet Take 1 tablet (25 mg total) by mouth 3 (three) times daily as needed for dizziness. 03/22/22  Yes Ijanae Macapagal L, PA  OVER THE COUNTER MEDICATION Take 1 tablet by mouth 2 (two) times daily. OTC sinus medication   Yes [provider]  pantoprazole (PROTONIX) 20 MG tablet Take 20 mg by mouth as needed.   Yes [provider]  SUMAtriptan (IMITREX) 25 MG tablet Take 1 tablet (25 mg total) by mouth every 2 (two) hours as needed for migraine. May repeat in 2 hours if headache persists or recurs.  No more than 200 mg in 24 hours 07/08/20  Yes Avegno, Darrelyn Hillock, FNP  traZODone (DESYREL) 50 MG tablet Take 50-100 mg by mouth at bedtime as needed for sleep.   Yes [provider]  HYDROcodone-acetaminophen (NORCO/VICODIN) 5-325 MG tablet Take 1 tablet by mouth every 6 (six) hours as needed for severe pain. Patient not taking: Reported on 03/22/2022 07/22/21   Evalee Jefferson, PA-C  venlafaxine (EFFEXOR) 37.5 MG tablet Take 37.5 mg by mouth 2 (two) times daily. Patient not taking: Reported on 03/22/2022    [provider]      Allergies    Darvocet [propoxyphene n-acetaminophen] and Lyrica [pregabalin]    Review of Systems   Review of Systems  Neurological:  Positive for dizziness.    Physical Exam Updated Vital  Signs BP 114/73   Pulse 65   Temp 98.6 F (37 C) (Oral)   Resp 18   Ht '5\' 11"'$  (1.803 m)   Wt 61.2 kg   SpO2 100%   BMI 18.83 kg/m  Physical Exam Vitals and nursing note reviewed.  Constitutional:      General: She is not in acute distress.    Appearance: She is well-developed.  HENT:     Head: Normocephalic and atraumatic.  Eyes:     Conjunctiva/sclera: Conjunctivae normal.  Cardiovascular:     Rate and Rhythm: Normal rate and regular rhythm.     Heart sounds: No murmur heard. Pulmonary:     Effort: Pulmonary effort is normal. No respiratory distress.     Breath sounds: Normal breath sounds.  Abdominal:      Palpations: Abdomen is soft.     Tenderness: There is no abdominal tenderness.  Musculoskeletal:        General: No swelling.     Cervical back: Neck supple.  Skin:    General: Skin is warm and dry.     Capillary Refill: Capillary refill takes less than 2 seconds.  Neurological:     Mental Status: She is alert.  Psychiatric:        Mood and Affect: Mood normal.     Comments: +tardive dykinesia     ED Results / Procedures / Treatments   Labs (all labs ordered are listed, but only abnormal results are displayed) Labs Reviewed  BASIC METABOLIC PANEL - Abnormal; Notable for the following components:      Result Value   Glucose, Bld 107 (*)    Calcium 8.7 (*)    All other components within normal limits  URINALYSIS, ROUTINE W REFLEX MICROSCOPIC - Abnormal; Notable for the following components:   Color, Urine STRAW (*)    Hgb urine dipstick Sherae Santino (*)    All other components within normal limits  CBG MONITORING, ED - Abnormal; Notable for the following components:   Glucose-Capillary 110 (*)    All other components within normal limits  CBC  TSH  T4, FREE  POC URINE PREG, ED    EKG EKG Interpretation  Date/Time:  Friday March 22 2022 13:20:52 EST Ventricular Rate:  80 PR Interval:  136 QRS Duration: 78 QT Interval:  386 QTC Calculation: 445 R Axis:   72 Text Interpretation: Normal sinus rhythm Normal ECG No previous ECGs available Confirmed by Noemi Chapel [phone]) on 03/22/2022 1:43:30 PM  Radiology No results found.  Procedures Procedures    Medications Ordered in ED Medications  scopolamine (TRANSDERM-SCOP) 1 MG/3DAYS 1.5 mg (1.5 mg Transdermal Patch Applied 03/22/22 2001)  sodium chloride 0.9 % bolus 1,000 mL (0 mLs Intravenous Stopped 03/22/22 1744)  meclizine (ANTIVERT) tablet 25 mg (25 mg Oral Given 03/22/22 1634)    ED Course/ Medical Decision Making/ A&P                           Medical Decision Making Patient is a 51 year old female, here  for persistent dizziness for the past day, associated some nausea,.  No vomiting or count of diarrhea.  She is overall well-appearing, just offkilter, she has no acute neurodeficits, and she states this feels like her last episode of vertigo.  Has not had an episode of vertigo for a while.  We will obtain EKG, basic labs, and urine for further evaluation as well as TSH given her history of thyroiditis.  Give her IV fluids, and some meclizine monitor for improvement.  Amount and/or Complexity of Data Reviewed Labs: ordered.    Details: Labs fairly unremarkable, stable hemoglobin, ECG/medicine tests:     Details: Normal sinus rhythm. Discussion of management or test interpretation with external provider(s): Discussed with patient after meclizine, scopolamine, she is feeling better, we discussed Antivert, using that at home for cognitive dizziness, and then the Epley maneuver as well as following up primary care doctor for further evaluation.  She has no acute neurodeficits, thus we did not obtain a head CT and she did not have any kind of head trauma.  Return precautions were emphasized, and she voiced understanding.  Risk Prescription drug management.   Final Clinical Impression(s) / ED Diagnoses Final diagnoses:  Vertigo    Rx / DC Orders ED Discharge Orders          Ordered    meclizine (ANTIVERT) 25 MG tablet  3 times daily PRN        03/22/22 2055              Osvaldo Shipper, [place] 03/22/22 2100    Noemi Chapel, MD 03/25/22 0830

## 2022-03-22 NOTE — ED Triage Notes (Signed)
Pt sates that she was told she was severely hydrated yesterday by EMS and has been dizzy since. Pt states she has been unable to walk due to the dizziness.  Pt states she has not had much to drink since being seen yesterday by EMS.    Pt has hx of vertigo.  No change with eyes open/shut.   Denies V/D. Has had nausea.

## 2022-03-30 DIAGNOSIS — I63233 Cerebral infarction due to unspecified occlusion or stenosis of bilateral carotid arteries: Secondary | ICD-10-CM | POA: Diagnosis not present

## 2022-03-30 DIAGNOSIS — I6523 Occlusion and stenosis of bilateral carotid arteries: Secondary | ICD-10-CM | POA: Diagnosis not present

## 2022-03-30 DIAGNOSIS — Z20822 Contact with and (suspected) exposure to covid-19: Secondary | ICD-10-CM | POA: Diagnosis not present

## 2022-03-30 DIAGNOSIS — F17210 Nicotine dependence, cigarettes, uncomplicated: Secondary | ICD-10-CM | POA: Diagnosis not present

## 2022-03-30 DIAGNOSIS — R4781 Slurred speech: Secondary | ICD-10-CM | POA: Diagnosis not present

## 2022-03-30 DIAGNOSIS — Z888 Allergy status to other drugs, medicaments and biological substances status: Secondary | ICD-10-CM | POA: Diagnosis not present

## 2022-03-30 DIAGNOSIS — R471 Dysarthria and anarthria: Secondary | ICD-10-CM | POA: Diagnosis not present

## 2022-03-30 DIAGNOSIS — S8992XA Unspecified injury of left lower leg, initial encounter: Secondary | ICD-10-CM | POA: Diagnosis not present

## 2022-03-30 DIAGNOSIS — R42 Dizziness and giddiness: Secondary | ICD-10-CM | POA: Diagnosis not present

## 2022-03-30 DIAGNOSIS — R531 Weakness: Secondary | ICD-10-CM | POA: Diagnosis not present

## 2022-03-31 DIAGNOSIS — S8992XA Unspecified injury of left lower leg, initial encounter: Secondary | ICD-10-CM | POA: Diagnosis not present

## 2022-04-01 DIAGNOSIS — F17210 Nicotine dependence, cigarettes, uncomplicated: Secondary | ICD-10-CM | POA: Diagnosis not present

## 2022-04-01 DIAGNOSIS — Z20822 Contact with and (suspected) exposure to covid-19: Secondary | ICD-10-CM | POA: Diagnosis not present

## 2022-04-01 DIAGNOSIS — Z888 Allergy status to other drugs, medicaments and biological substances status: Secondary | ICD-10-CM | POA: Diagnosis not present

## 2022-04-01 DIAGNOSIS — I63233 Cerebral infarction due to unspecified occlusion or stenosis of bilateral carotid arteries: Secondary | ICD-10-CM | POA: Diagnosis not present

## 2022-04-02 DIAGNOSIS — I63233 Cerebral infarction due to unspecified occlusion or stenosis of bilateral carotid arteries: Secondary | ICD-10-CM | POA: Diagnosis not present

## 2022-04-02 DIAGNOSIS — R4781 Slurred speech: Secondary | ICD-10-CM | POA: Diagnosis not present

## 2022-04-02 DIAGNOSIS — G9389 Other specified disorders of brain: Secondary | ICD-10-CM | POA: Diagnosis not present

## 2022-04-02 DIAGNOSIS — Z20822 Contact with and (suspected) exposure to covid-19: Secondary | ICD-10-CM | POA: Diagnosis not present

## 2022-04-02 DIAGNOSIS — R531 Weakness: Secondary | ICD-10-CM | POA: Diagnosis not present

## 2022-04-02 DIAGNOSIS — F17210 Nicotine dependence, cigarettes, uncomplicated: Secondary | ICD-10-CM | POA: Diagnosis not present

## 2022-04-02 DIAGNOSIS — Z888 Allergy status to other drugs, medicaments and biological substances status: Secondary | ICD-10-CM | POA: Diagnosis not present

## 2022-04-02 DIAGNOSIS — R42 Dizziness and giddiness: Secondary | ICD-10-CM | POA: Diagnosis not present

## 2022-04-05 NOTE — Progress Notes (Unsigned)
Virtual Visit Via Video       Consent was obtained for video visit:  Yes.   Answered questions that patient had about telehealth interaction:  Yes.   I discussed the limitations, risks, security and privacy concerns of performing an evaluation and management service by telemedicine. I also discussed with the patient that there may be a patient responsible charge related to this service. The patient expressed understanding and agreed to proceed.  Pt location: Home Physician Location: office Name of referring provider:  Jake Samples, PA* I connected with [name] at patients initiation/request on 04/09/2022 at 12:30 PM EST by video enabled telemedicine application and verified that I am speaking with the correct person using two identifiers. Pt MRN:  [id number] Pt DOB:  [birth year] Video Participants:  [name];    Assessment/Plan:   1.  Tardive Dyskinesia, due to exposure to abilify  -she tried ingrezza but she didn't follow up.  She states she initially found it effective, but effectiveness wore off.    -She would like to trial other things.  We can certainly do this, but she will need to follow-up in the office for this in order to get medication started.  2.  Probable BPPV or acute labyrinthitis  -Those symptoms have resolved  - MRI of the brain was nonacute.  I do not have films.  It was important that these were done, however, as she reported speech changes and weakness with the above symptoms.  -My biggest concern remains the amount of Xanax that the patient is on.  This can certainly cause dizziness, speech change, gait instability, etc.  This should be addressed with prescribing provider.  She and I discussed this today.  3.  Seizure-like activity  -Patient states that she was told in the ER that this was a "pseudoseizure."  However, she is not so convinced.    -We will check EEG and if negative we will check ambulatory EEG.   Subjective:   [name] was seen today.  Son with pt and supplements hx. both are lying in her bed on the video visit.  I have not seen the patient in almost a year and a half.  I have only seen her 1 time previously.  When I saw her previously, I felt she likely had tardive dyskinesia (likely from Abilify).  Patient started on Ingrezza last visit for probable tardive dyskinesia (she reported she likely was on Abilify and probably Risperdal in the past, although I had trouble confirming that).  She no showed her follow-up and is currently not on Ingrezza.  She states that she took it and "it worked at first" but it quit working.  She is interested in trialing something else in the future.  Last visit, she was complaining about memory change, and I told her I thought it was almost certainly due to her medications.  I weaned her off of the Cogentin, as it was not particularly effective for tardive dyskinesia for her.  However, I told her that I also thought Xanax played a significant role in her memory change.  She was on 1 mg 4 times per day.  I told her I thought she would need to wean that down and treat anxiety differently on a long-term basis.  I also told her that we would need to leave this to the prescribing provider, and encouraged her to establish care with psychiatry.  On April 26, 2021 she was actually in the  emergency room with what was described as Xanax withdrawal.  There was no seizure.  They described it as "barely perceptible left-sided intention tremor."  They called that benzodiazepine withdrawal.  This is despite the fact that PDMP indicated number 120 pills of 1 mg Xanax refilled on December 27 and the date of ER visit, April 26, 2021 (so no indication of w/d).  This event was one year ago.  She reports today that she is still on Xanax.  She states that sometimes she takes 1 pill/day and sometimes perhaps up to 4.  She states that she rarely takes 4.  PDMP is reviewed.  She does get number 120  pills/month.  Patient comes today because of a recent ER visit from [facility] and those notes are reviewed.  She went to the ER with dizziness and speech trouble.  Trazodone was discontinued, as they felt that could be contributing to her speech issue.  MRI of the brain was nonacute.  It was reported to show a remote infarct in the left frontal periventricular white matter.  They told her to substitute Ambien for sleep instead.  Emergency room records from nursing (not physician/provider) did indicate that patient was noted to have "seizure-like activity, able to follow commands and track with eyes.  Ativan 1 mg IV given with no change in behavior."  Pt reports today that she had gotten up the day prior to presentation and felt fine.  She layed down for a bit and then got up and "I was spinning like I was drunk.  I was grabbing for everything."   No falls but felt like she could.  She called ems but decided not to go to the hospital that day but did go to [facility] the following day.  She states that they gave her antivert and she was d/c.  "I went through [place] and I kept getting worse."  That is when she went to [facility] in [place].  She states that her legs were like "limp noodles" and her speech was not good.  Her son states that the 2nd day in [place] ER, she had a seizure that "they stated wasn't a seizure."  Pt states "they called it a pseudoseizure."  Pt states that she doesn't remember it but was told that her face was "drawing up and down."  She states that she fell asleep after.  She states that there was no loss of bowel/bladder control.  CURRENT MEDICATIONS:  Outpatient Encounter Medications as of 04/09/2022  Medication Sig   ALPRAZolam (XANAX) 1 MG tablet Take 1 mg by mouth 4 (four) times daily as needed for anxiety.   baclofen (LIORESAL) 10 MG tablet Take 10 mg by mouth 4 (four) times daily as needed for muscle spasms.   citalopram (CELEXA) 40 MG tablet Take 40 mg by mouth daily.    HYDROcodone-acetaminophen (NORCO/VICODIN) 5-325 MG tablet Take 1 tablet by mouth every 6 (six) hours as needed for severe pain. (Patient not taking: Reported on 03/22/2022)   ibuprofen (ADVIL) 800 MG tablet Take 800 mg by mouth every 8 (eight) hours as needed.   levothyroxine (SYNTHROID) 75 MCG tablet Take 75 mcg by mouth daily.   meclizine (ANTIVERT) 25 MG tablet Take 1 tablet (25 mg total) by mouth 3 (three) times daily as needed for dizziness.   OVER THE COUNTER MEDICATION Take 1 tablet by mouth 2 (two) times daily. OTC sinus medication   traZODone (DESYREL) 50 MG tablet Take 50-100 mg by mouth at bedtime as  needed for sleep.   venlafaxine (EFFEXOR) 37.5 MG tablet Take 37.5 mg by mouth 2 (two) times daily. (Patient not taking: Reported on 03/22/2022)   [DISCONTINUED] albuterol (VENTOLIN HFA) 108 (90 Base) MCG/ACT inhaler Inhale into the lungs every 6 (six) hours as needed for wheezing or shortness of breath.   [DISCONTINUED] levothyroxine (SYNTHROID) 50 MCG tablet Take 50 mcg by mouth daily.   [DISCONTINUED] pantoprazole (PROTONIX) 20 MG tablet Take 20 mg by mouth as needed.   [DISCONTINUED] SUMAtriptan (IMITREX) 25 MG tablet Take 1 tablet (25 mg total) by mouth every 2 (two) hours as needed for migraine. May repeat in 2 hours if headache persists or recurs.  No more than 200 mg in 24 hours   No facility-administered encounter medications on file as of 04/09/2022.     Objective:   PHYSICAL EXAMINATION:    VITALS:  There were no vitals filed for this visit.  GEN:  The patient appears stated age and is in NAD. HEENT:  Normocephalic, atraumatic.  The mucous membranes are moist.   Neurological examination:  Orientation: The patient is alert and oriented x3. Cranial nerves: There is good facial symmetry.The speech is fluent and clear.   Movement examination: Orientation:  The patient is alert and oriented x 3.   Cranial nerves: There is good facial symmetry. Speech is fluent and clear.   Abnormal movements: The patient does have movements of the tongue, occasionally protruding out of the mouth.      Total time spent on today's visit was 30 minutes, including both face-to-face time and nonface-to-face time.  Time included that spent on review of records (prior notes available to me/labs/imaging if pertinent), discussing treatment and goals, answering patient's questions and coordinating care.  Cc:  Jake Samples, PA-C

## 2022-04-09 ENCOUNTER — Other Ambulatory Visit: Payer: Self-pay

## 2022-04-09 ENCOUNTER — Telehealth (INDEPENDENT_AMBULATORY_CARE_PROVIDER_SITE_OTHER): Payer: Medicare HMO | Admitting: Neurology

## 2022-04-09 DIAGNOSIS — R251 Tremor, unspecified: Secondary | ICD-10-CM

## 2022-04-09 DIAGNOSIS — G2401 Drug induced subacute dyskinesia: Secondary | ICD-10-CM | POA: Diagnosis not present

## 2022-04-09 DIAGNOSIS — H8113 Benign paroxysmal vertigo, bilateral: Secondary | ICD-10-CM | POA: Diagnosis not present

## 2022-04-19 ENCOUNTER — Other Ambulatory Visit: Payer: Medicare HMO

## 2022-04-30 ENCOUNTER — Ambulatory Visit: Payer: Medicare HMO | Admitting: Neurology

## 2022-05-03 ENCOUNTER — Ambulatory Visit (INDEPENDENT_AMBULATORY_CARE_PROVIDER_SITE_OTHER): Payer: Medicare HMO | Admitting: Neurology

## 2022-05-03 ENCOUNTER — Telehealth: Payer: Self-pay | Admitting: Neurology

## 2022-05-03 DIAGNOSIS — R251 Tremor, unspecified: Secondary | ICD-10-CM

## 2022-05-03 NOTE — Telephone Encounter (Signed)
Let pt know that EEG did not demonstrate any epileptic seizures.  She had a shaking spell during the eeg and there was no evidence that this was an epileptic event on the eeg.  If she would like to discuss further, we can make appt with our PA to discuss.

## 2022-05-03 NOTE — Procedures (Signed)
TECHNICAL SUMMARY:  A multichannel referential and bipolar montage EEG using the standard international 10-20 system was performed on the patient described as awake.  The dominant background activity consists of 8 hertz activity seen most prominantly over the posterior head region.  There was an excess amount of beta activity.  Much of the recording is impacted by excess muscle/myogenic activity, which affects the quality of the recording.  ACTIVATION:  Stepwise photic stimulation at 4-20 flashes per second was performed and did not elicit any abnormal waveforms.  Hyperventilation was not performed.  EPILEPTIFORM ACTIVITY:  There were no spikes, sharp waves or paroxysmal activity.  There was, however, an excess amount of muscle/myogenic artifact throughout the recording, which did impact quality of the recording at times.  Patient did have several episodes of jerking and tremulous activity, without obvious change in the background activity.  SLEEP: No sleep is noted.   IMPRESSION:  This EEG did not demonstrate any focal, posterior, or lateralizing features.  There was an excessive amount of beta activity throughout the recording, which is often due to medication effect (often benzodiazepines).  Patient did experience episodes of jerking and shaking throughout the recording.  There was no obvious change in the background activity during this.  As above, there were parts of the recording that were impacted by excess amounts of muscle/myogenic activity.  Correlate clinically.

## 2022-05-03 NOTE — Progress Notes (Signed)
EEG complete - results pending 

## 2022-05-03 NOTE — Telephone Encounter (Signed)
Talked to [name] and her son . Patient back to baseline and if not call 911 or take her to hospital . Will check on her on Monday

## 2022-05-03 NOTE — Telephone Encounter (Signed)
Called patient and patients son said patient was busy she maybe having a small seizure right now

## 2022-05-28 DIAGNOSIS — E063 Autoimmune thyroiditis: Secondary | ICD-10-CM | POA: Diagnosis not present

## 2022-05-28 DIAGNOSIS — Z1331 Encounter for screening for depression: Secondary | ICD-10-CM | POA: Diagnosis not present

## 2022-05-28 DIAGNOSIS — Z0001 Encounter for general adult medical examination with abnormal findings: Secondary | ICD-10-CM | POA: Diagnosis not present

## 2022-05-28 DIAGNOSIS — Z6821 Body mass index (BMI) 21.0-21.9, adult: Secondary | ICD-10-CM | POA: Diagnosis not present

## 2022-05-30 ENCOUNTER — Encounter: Payer: Self-pay | Admitting: Radiology

## 2022-07-08 ENCOUNTER — Encounter: Payer: Self-pay | Admitting: "Endocrinology

## 2022-08-05 ENCOUNTER — Ambulatory Visit (INDEPENDENT_AMBULATORY_CARE_PROVIDER_SITE_OTHER): Payer: Medicare HMO | Admitting: "Endocrinology

## 2022-08-05 ENCOUNTER — Encounter: Payer: Self-pay | Admitting: "Endocrinology

## 2022-08-05 VITALS — BP 104/70 | HR 72 | Ht 67.5 in | Wt 143.0 lb

## 2022-08-05 DIAGNOSIS — E890 Postprocedural hypothyroidism: Secondary | ICD-10-CM | POA: Diagnosis not present

## 2022-08-05 NOTE — Progress Notes (Signed)
Endocrinology Consult Note                                            08/05/2022, 7:02 PM   Subjective:    Patient ID: [name], female    DOB: October 13, 1970, PCP Avis Epley, PA-C   Past Medical History:  Diagnosis Date   Anxiety    Chronic back pain    Chronic neck pain    Complication of anesthesia    pt states her O2 saturation drops after surgery   GERD (gastroesophageal reflux disease)    Headache    migraines- last one 2 months ago   Hypercholesteremia    Hypothyroidism    Kidney stones    Osteoporosis    Reflex sympathetic dystrophy    Tardive dyskinesia    Thyroid disease    Past Surgical History:  Procedure Laterality Date   ABDOMINAL HYSTERECTOMY     partial   APPENDECTOMY     BACK SURGERY     plates and screws   BREAST SURGERY Left    fibrocystic tumor   CARPAL TUNNEL RELEASE Right 03/22/2019   Procedure: RIGHT CARPAL TUNNEL RELEASE;  Surgeon: Betha Loa, MD;  Location: [facility];  Service: Orthopedics;  Laterality: Right;  NO LOCAL   CHOLECYSTECTOMY     CYST EXCISION Left    foot   EAR CYST EXCISION Right 01/27/2013   Procedure: EXCISION CYST ON SCALP ;  Surgeon: Dalia Heading, MD;  Location: [facility];  Service: General;  Laterality: Right;   EAR CYST EXCISION Right 01/27/2013   Procedure: CYST REMOVAL right axilla;  Surgeon: Dalia Heading, MD;  Location: [facility];  Service: General;  Laterality: Right;   KNEE ARTHROSCOPY Left    NECK SURGERY     plate in neck   SALPINGOOPHORECTOMY Right    STRABISMUS SURGERY Bilateral    THYROIDECTOMY N/A 03/14/2014   Procedure: TOTAL THYROIDECTOMY;  Surgeon: Dalia Heading, MD;  Location: [facility];  Service: General;  Laterality: N/A;   Social History   Socioeconomic History   Marital status: Widowed    Spouse name: Not on file   Number of children: Not on file   Years of education: Not on file   Highest education level: Not on file  Occupational History   Not on file   Tobacco Use   Smoking status: Every Day    Packs/day: 0.50    Years: 20.00    Additional pack years: 0.00    Total pack years: 10.00    Types: Cigarettes   Smokeless tobacco: Never  Substance and Sexual Activity   Alcohol use: Yes    Comment: occasionally   Drug use: No    Comment: prescription Rx   Sexual activity: Never  Other Topics Concern   Not on file  Social History Narrative   Lives alone.   Social Determinants of Health   Financial Resource Strain: Not on file  Food Insecurity: Not on file  Transportation Needs: Not on file  Physical Activity: Not on file  Stress: Not on file  Social Connections: Not on file   Family History  Problem Relation Age of Onset   Breast cancer Mother    Parkinson's disease Father    Depression Sister    Outpatient Encounter Medications as of 08/05/2022  Medication  Sig   levothyroxine (SYNTHROID) 50 MCG tablet Take 50 mcg by mouth daily.   venlafaxine (EFFEXOR) 37.5 MG tablet Take 37.5 mg by mouth 2 (two) times daily.   ALPRAZolam (XANAX) 1 MG tablet Take 1 mg by mouth 4 (four) times daily as needed for anxiety.   baclofen (LIORESAL) 10 MG tablet Take 10 mg by mouth 4 (four) times daily as needed for muscle spasms.   citalopram (CELEXA) 40 MG tablet Take 40 mg by mouth daily.   OVER THE COUNTER MEDICATION Take 1 tablet by mouth 2 (two) times daily. OTC sinus medication   traZODone (DESYREL) 50 MG tablet Take 50-100 mg by mouth at bedtime as needed for sleep.   [DISCONTINUED] HYDROcodone-acetaminophen (NORCO/VICODIN) 5-325 MG tablet Take 1 tablet by mouth every 6 (six) hours as needed for severe pain. (Patient not taking: Reported on 03/22/2022)   [DISCONTINUED] ibuprofen (ADVIL) 800 MG tablet Take 800 mg by mouth every 8 (eight) hours as needed.   [DISCONTINUED] levothyroxine (SYNTHROID) 75 MCG tablet Take 75 mcg by mouth daily.   [DISCONTINUED] meclizine (ANTIVERT) 25 MG tablet Take 1 tablet (25 mg total) by mouth 3 (three) times  daily as needed for dizziness.   No facility-administered encounter medications on file as of 08/05/2022.   ALLERGIES: Allergies  Allergen Reactions   Darvocet [Propoxyphene N-Acetaminophen] Hives   Lyrica [Pregabalin] Swelling    VACCINATION STATUS: Immunization History  Administered Date(s) Administered   Influenza,inj,Quad PF,6+ Mos 03/15/2014   Tdap 05/05/2013    HPI [name] is 52 y.o. female who presents today with a medical history as above. she is being seen in consultation for postsurgical hypothyroidism requested by Avis Epley, PA-C.  History is obtained directly from the patient as well as chart review.  She underwent total thyroidectomy in 2015 for nodular goiter.  Her surgical pathology did not show any malignancy.  She has been on various dose of levothyroxine ever since.  She is currently on levothyroxine 50 mcg p.o. daily with good consistency.  She does not have recent thyroid function test to review.  She reports fatigue, progressive weight gain.  She denies dysphagia, shortness of breath, nor voice change. Patient remains smoker.  Review of Systems  Constitutional: + Mildly fluctuating body weight, + fatigue, no subjective hyperthermia, no subjective hypothermia Eyes: no blurry vision, no xerophthalmia ENT: no sore throat, no nodules palpated in throat, no dysphagia/odynophagia, no hoarseness Cardiovascular: no Chest Pain, no Shortness of Breath, no palpitations, no leg swelling Respiratory: no cough, no shortness of breath Gastrointestinal: no Nausea/Vomiting/Diarhhea Musculoskeletal: no muscle/joint aches Skin: no rashes Neurological: no tremors, no numbness, no tingling, no dizziness Psychiatric: no depression, no anxiety  Objective:       08/05/2022    2:40 PM 03/22/2022    9:15 PM 03/22/2022    9:13 PM  Vitals with BMI  Height 5' 7.5"    Weight 143 lbs    BMI 22.05    Systolic 104  108  Diastolic 70  59  Pulse 72 67 67    BP  104/70   Pulse 72   Ht 5' 7.5" (1.715 m)   Wt 143 lb (64.9 kg)   BMI 22.07 kg/m   Wt Readings from Last 3 Encounters:  08/05/22 143 lb (64.9 kg)  03/22/22 135 lb (61.2 kg)  12/21/21 150 lb (68 kg)    Physical Exam  Constitutional:  Body mass index is 22.07 kg/m.,  not in acute distress, normal state of mind  Eyes: PERRLA, EOMI, no exophthalmos ENT: moist mucous membranes, no gross thyromegaly, no gross cervical lymphadenopathy Cardiovascular: normal precordial activity, Regular Rate and Rhythm, no Murmur/Rubs/Gallops Respiratory:  adequate breathing efforts, no gross chest deformity, Clear to auscultation bilaterally Gastrointestinal: abdomen soft, Non -tender, No distension, Bowel Sounds present, no gross organomegaly Musculoskeletal: no gross deformities, strength intact in all four extremities, no peripheral edema Skin: moist, warm, no rashes Neurological: no tremor with outstretched hands, Deep tendon reflexes normal in bilateral lower extremities.  CMP ( most recent) CMP     Component Value Date/Time   NA 138 03/22/2022 1344   K 4.3 03/22/2022 1344   CL 106 03/22/2022 1344   CO2 24 03/22/2022 1344   GLUCOSE 107 (H) 03/22/2022 1344   BUN 13 03/22/2022 1344   CREATININE 0.91 03/22/2022 1344   CALCIUM 8.7 (L) 03/22/2022 1344   PROT 5.7 (L) 03/15/2014 0632   ALBUMIN 2.9 (L) 03/15/2014 0632   AST 11 03/15/2014 0632   ALT 9 03/15/2014 0632   ALKPHOS 114 03/15/2014 0632   BILITOT <0.2 (L) 03/15/2014 0632   GFRNONAA >60 03/22/2022 1344   GFRAA 88 (L) 03/15/2014 0632    Lab Results  Component Value Date   TSH 0.377 03/22/2022   TSH 4.350 03/09/2014   FREET4 0.85 03/22/2022      Assessment & Plan:   1. Postsurgical hypothyroidism  - [name]  is being seen at a kind request of Avis Epley, PA-C. - I have reviewed her available  records and clinically evaluated the patient. - Based on these reviews, she has postsurgical hypothyroidism,  however,   there is not sufficient information to proceed with definitive treatment plan. She is advised to remain on her levothyroxine until next measurement of thyroid function test.  She is currently on levothyroxine 50 mcg p.o. daily before breakfast.   - We discussed about the correct intake of her thyroid hormone, on empty stomach at fasting, with water, separated by at least 30 minutes from breakfast and other medications,  and separated by more than 4 hours from calcium, iron, multivitamins, acid reflux medications (PPIs). -Patient is made aware of the fact that thyroid hormone replacement is needed for life, dose to be adjusted by periodic monitoring of thyroid function tests.  She will be sent to lab today for TSH, free T4, free T3 and patient will return in 7-10 days for reevaluation.   - I did not initiate any new prescriptions today. - she is advised to maintain close follow up with Avis Epley, PA-C for primary care needs.   - Time spent with the patient: 45 minutes, of which >50% was spent in  counseling her about her postsurgical hypothyroidism and the rest in obtaining information about her symptoms, reviewing her previous labs/studies ( including abstractions from other facilities),  evaluations, and treatments,  and developing a plan to confirm diagnosis and long term treatment based on the latest standards of care/guidelines; and documenting her care.  [name] participated in the discussions, expressed understanding, and voiced agreement with the above plans.  All questions were answered to her satisfaction. she is encouraged to contact clinic should she have any questions or concerns prior to her return visit.  Follow up plan: Return in about 1 week (around 08/12/2022) for Labs Today- Non-Fasting Ok.   Marquis Lunch, MD [facility] [address] [place], [place] [address] Phone: [phone]  Fax: [phone]  08/05/2022, 7:02 PM  This note was partially dictated with voice recognition software. Similar sounding words can be transcribed inadequately or may not  be corrected upon review.

## 2022-08-06 LAB — T4, FREE: Free T4: 0.92 ng/dL (ref 0.82–1.77)

## 2022-08-06 LAB — TSH: TSH: 42.5 u[IU]/mL — ABNORMAL HIGH (ref 0.450–4.500)

## 2022-08-06 LAB — T3, FREE: T3, Free: 1.9 pg/mL — ABNORMAL LOW (ref 2.0–4.4)

## 2022-08-12 ENCOUNTER — Encounter: Payer: Self-pay | Admitting: "Endocrinology

## 2022-08-12 ENCOUNTER — Ambulatory Visit (INDEPENDENT_AMBULATORY_CARE_PROVIDER_SITE_OTHER): Payer: Medicare HMO | Admitting: "Endocrinology

## 2022-08-12 VITALS — BP 122/78 | HR 84 | Ht 67.5 in | Wt 143.6 lb

## 2022-08-12 DIAGNOSIS — E890 Postprocedural hypothyroidism: Secondary | ICD-10-CM | POA: Diagnosis not present

## 2022-08-12 MED ORDER — LEVOTHYROXINE SODIUM 75 MCG PO TABS: 75.0000 ug | ORAL_TABLET | Freq: Every day | ORAL | 1 refills | Status: DC

## 2022-08-12 NOTE — Progress Notes (Unsigned)
Endocrinology Consult Note                                            08/12/2022, 5:03 PM   Subjective:    Patient ID: [name], female    DOB: 04/30/1970, PCP Avis Epley, PA-C   Past Medical History:  Diagnosis Date   Anxiety    Chronic back pain    Chronic neck pain    Complication of anesthesia    pt states her O2 saturation drops after surgery   GERD (gastroesophageal reflux disease)    Headache    migraines- last one 2 months ago   Hypercholesteremia    Hypothyroidism    Kidney stones    Osteoporosis    Reflex sympathetic dystrophy    Tardive dyskinesia    Thyroid disease    Past Surgical History:  Procedure Laterality Date   ABDOMINAL HYSTERECTOMY     partial   APPENDECTOMY     BACK SURGERY     plates and screws   BREAST SURGERY Left    fibrocystic tumor   CARPAL TUNNEL RELEASE Right 03/22/2019   Procedure: RIGHT CARPAL TUNNEL RELEASE;  Surgeon: Betha Loa, MD;  [facility];  Service: Orthopedics;  Laterality: Right;  NO LOCAL   CHOLECYSTECTOMY     CYST EXCISION Left    foot   EAR CYST EXCISION Right 01/27/2013   Procedure: EXCISION CYST ON SCALP ;  Surgeon: Dalia Heading, MD;  Location: [facility];  Service: General;  Laterality: Right;   EAR CYST EXCISION Right 01/27/2013   Procedure: CYST REMOVAL right axilla;  Surgeon: Dalia Heading, MD;  Location: [facility];  Service: General;  Laterality: Right;   KNEE ARTHROSCOPY Left    NECK SURGERY     plate in neck   SALPINGOOPHORECTOMY Right    STRABISMUS SURGERY Bilateral    THYROIDECTOMY N/A 03/14/2014   Procedure: TOTAL THYROIDECTOMY;  Surgeon: Dalia Heading, MD;  Location: [facility];  Service: General;  Laterality: N/A;   Social History   Socioeconomic History   Marital status: Widowed    Spouse name: Not on file   Number of children: Not on file   Years of education: Not on file   Highest education level: Not on file  Occupational History   Not on file   Tobacco Use   Smoking status: Every Day    Packs/day: 0.50    Years: 20.00    Additional pack years: 0.00    Total pack years: 10.00    Types: Cigarettes   Smokeless tobacco: Never  Substance and Sexual Activity   Alcohol use: Yes    Comment: occasionally   Drug use: No    Comment: prescription Rx   Sexual activity: Never  Other Topics Concern   Not on file  Social History Narrative   Lives alone.   Social Determinants of Health   Financial Resource Strain: Not on file  Food Insecurity: Not on file  Transportation Needs: Not on file  Physical Activity: Not on file  Stress: Not on file  Social Connections: Not on file   Family History  Problem Relation Age of Onset   Breast cancer Mother    Parkinson's disease Father    Depression Sister    Outpatient Encounter Medications as of 08/12/2022  Medication  Sig   ALPRAZolam (XANAX) 1 MG tablet Take 1 mg by mouth 4 (four) times daily as needed for anxiety.   baclofen (LIORESAL) 10 MG tablet Take 10 mg by mouth 4 (four) times daily as needed for muscle spasms.   citalopram (CELEXA) 40 MG tablet Take 40 mg by mouth daily.   levothyroxine (SYNTHROID) 75 MCG tablet Take 1 tablet (75 mcg total) by mouth daily before breakfast.   OVER THE COUNTER MEDICATION Take 1 tablet by mouth 2 (two) times daily. OTC sinus medication   traZODone (DESYREL) 50 MG tablet Take 50-100 mg by mouth at bedtime as needed for sleep.   venlafaxine (EFFEXOR) 37.5 MG tablet Take 37.5 mg by mouth 2 (two) times daily.   [DISCONTINUED] levothyroxine (SYNTHROID) 50 MCG tablet Take 50 mcg by mouth daily.   No facility-administered encounter medications on file as of 08/12/2022.   ALLERGIES: Allergies  Allergen Reactions   Darvocet [Propoxyphene N-Acetaminophen] Hives   Lyrica [Pregabalin] Swelling    VACCINATION STATUS: Immunization History  Administered Date(s) Administered   Influenza,inj,Quad PF,6+ Mos 03/15/2014   Tdap 05/05/2013    HPI Autumn L  Floyd is 52 y.o. female who presents today with a medical history as above. she is being seen in consultation for postsurgical hypothyroidism requested by Avis Epley, PA-C.  History is obtained directly from the patient as well as chart review.  She underwent total thyroidectomy in 2015 for nodular goiter.  Her surgical pathology did not show any malignancy.  She has been on various dose of levothyroxine ever since.  She is currently on levothyroxine 50 mcg p.o. daily with good consistency.  She does not have recent thyroid function test to review.  She reports fatigue, progressive weight gain.  She denies dysphagia, shortness of breath, nor voice change. Patient remains smoker.  Review of Systems  Constitutional: + Mildly fluctuating body weight, + fatigue, no subjective hyperthermia, no subjective hypothermia Eyes: no blurry vision, no xerophthalmia ENT: no sore throat, no nodules palpated in throat, no dysphagia/odynophagia, no hoarseness Cardiovascular: no Chest Pain, no Shortness of Breath, no palpitations, no leg swelling Respiratory: no cough, no shortness of breath Gastrointestinal: no Nausea/Vomiting/Diarhhea Musculoskeletal: no muscle/joint aches Skin: no rashes Neurological: no tremors, no numbness, no tingling, no dizziness Psychiatric: no depression, no anxiety  Objective:       08/12/2022   10:59 AM 08/05/2022    2:40 PM 03/22/2022    9:15 PM  Vitals with BMI  Height 5' 7.5" 5' 7.5"   Weight 143 lbs 10 oz 143 lbs   BMI 22.15 22.05   Systolic 122 104   Diastolic 78 70   Pulse 84 72 67    BP 122/78   Pulse 84   Ht 5' 7.5" (1.715 m)   Wt 143 lb 9.6 oz (65.1 kg)   BMI 22.16 kg/m   Wt Readings from Last 3 Encounters:  08/12/22 143 lb 9.6 oz (65.1 kg)  08/05/22 143 lb (64.9 kg)  03/22/22 135 lb (61.2 kg)    Physical Exam  Constitutional:  Body mass index is 22.16 kg/m.,  not in acute distress, normal state of mind Eyes: PERRLA, EOMI, no  exophthalmos ENT: moist mucous membranes, no gross thyromegaly, no gross cervical lymphadenopathy Cardiovascular: normal precordial activity, Regular Rate and Rhythm, no Murmur/Rubs/Gallops Respiratory:  adequate breathing efforts, no gross chest deformity, Clear to auscultation bilaterally Gastrointestinal: abdomen soft, Non -tender, No distension, Bowel Sounds present, no gross organomegaly Musculoskeletal: no gross deformities, strength intact in  all four extremities, no peripheral edema Skin: moist, warm, no rashes Neurological: no tremor with outstretched hands, Deep tendon reflexes normal in bilateral lower extremities.  CMP ( most recent) CMP     Component Value Date/Time   NA 138 03/22/2022 1344   K 4.3 03/22/2022 1344   CL 106 03/22/2022 1344   CO2 24 03/22/2022 1344   GLUCOSE 107 (H) 03/22/2022 1344   BUN 13 03/22/2022 1344   CREATININE 0.91 03/22/2022 1344   CALCIUM 8.7 (L) 03/22/2022 1344   PROT 5.7 (L) 03/15/2014 0632   ALBUMIN 2.9 (L) 03/15/2014 0632   AST 11 03/15/2014 0632   ALT 9 03/15/2014 0632   ALKPHOS 114 03/15/2014 0632   BILITOT <0.2 (L) 03/15/2014 0632   GFRNONAA >60 03/22/2022 1344   GFRAA 88 (L) 03/15/2014 0632    Lab Results  Component Value Date   TSH 42.500 (H) 08/05/2022   TSH 0.377 03/22/2022   TSH 4.350 03/09/2014   FREET4 0.92 08/05/2022   FREET4 0.85 03/22/2022      Assessment & Plan:   1. Postsurgical hypothyroidism  - Makira L Bott  is being seen at a kind request of Avis Epley, PA-C. - I have reviewed her available  records and clinically evaluated the patient. - Based on these reviews, she has postsurgical hypothyroidism,  however,  there is not sufficient information to proceed with definitive treatment plan. She is advised to remain on her levothyroxine until next measurement of thyroid function test.  She is currently on levothyroxine 50 mcg p.o. daily before breakfast.   - We discussed about the correct intake of  her thyroid hormone, on empty stomach at fasting, with water, separated by at least 30 minutes from breakfast and other medications,  and separated by more than 4 hours from calcium, iron, multivitamins, acid reflux medications (PPIs). -Patient is made aware of the fact that thyroid hormone replacement is needed for life, dose to be adjusted by periodic monitoring of thyroid function tests.  She will be sent to lab today for TSH, free T4, free T3 and patient will return in 7-10 days for reevaluation.   - I did not initiate any new prescriptions today. - she is advised to maintain close follow up with Avis Epley, PA-C for primary care needs.   - Time spent with the patient: 45 minutes, of which >50% was spent in  counseling her about her postsurgical hypothyroidism and the rest in obtaining information about her symptoms, reviewing her previous labs/studies ( including abstractions from other facilities),  evaluations, and treatments,  and developing a plan to confirm diagnosis and long term treatment based on the latest standards of care/guidelines; and documenting her care.  Rahcel L Ercole participated in the discussions, expressed understanding, and voiced agreement with the above plans.  All questions were answered to her satisfaction. she is encouraged to contact clinic should she have any questions or concerns prior to her return visit.  Follow up plan: Return in about 5 months (around 01/12/2023) for Fasting Labs  in AM B4 8.   Marquis Lunch, MD [facility] [address] [place], [place] [address] Phone: [phone]  Fax: [phone]     08/12/2022, 5:03 PM  This note was partially dictated with voice recognition software. Similar sounding words can be transcribed inadequately or may not  be corrected upon review.

## 2022-08-30 DIAGNOSIS — K219 Gastro-esophageal reflux disease without esophagitis: Secondary | ICD-10-CM | POA: Diagnosis not present

## 2022-08-30 DIAGNOSIS — R0789 Other chest pain: Secondary | ICD-10-CM | POA: Diagnosis not present

## 2022-08-30 DIAGNOSIS — G9059 Complex regional pain syndrome I of other specified site: Secondary | ICD-10-CM | POA: Diagnosis not present

## 2022-08-30 DIAGNOSIS — G459 Transient cerebral ischemic attack, unspecified: Secondary | ICD-10-CM | POA: Diagnosis not present

## 2022-08-30 DIAGNOSIS — Z6821 Body mass index (BMI) 21.0-21.9, adult: Secondary | ICD-10-CM | POA: Diagnosis not present

## 2022-08-30 DIAGNOSIS — I10 Essential (primary) hypertension: Secondary | ICD-10-CM | POA: Diagnosis not present

## 2022-08-30 DIAGNOSIS — L723 Sebaceous cyst: Secondary | ICD-10-CM | POA: Diagnosis not present

## 2022-09-12 ENCOUNTER — Encounter: Payer: Self-pay | Admitting: *Deleted

## 2022-09-16 ENCOUNTER — Ambulatory Visit (INDEPENDENT_AMBULATORY_CARE_PROVIDER_SITE_OTHER): Payer: Medicare HMO | Admitting: Plastic Surgery

## 2022-09-16 ENCOUNTER — Encounter: Payer: Self-pay | Admitting: Plastic Surgery

## 2022-09-16 VITALS — BP 103/64 | HR 105 | Ht 67.5 in | Wt 140.0 lb

## 2022-09-16 DIAGNOSIS — R220 Localized swelling, mass and lump, head: Secondary | ICD-10-CM

## 2022-09-16 DIAGNOSIS — D489 Neoplasm of uncertain behavior, unspecified: Secondary | ICD-10-CM

## 2022-09-16 NOTE — Progress Notes (Signed)
Referring Provider Avis Epley, PA-C [address] [place],  [place] [address]   CC:  Chief Complaint  Patient presents with   Consult      [name] is an 52 y.o. female.  HPI: [name] is a 52 year old female who presents today with complaints of a mass at her right mandibular border.  She is unsure how long this has been there but she feels that it is most likely a lipoma.  Would like to have it removed.  Allergies  Allergen Reactions   Darvocet [Propoxyphene N-Acetaminophen] Hives   Lyrica [Pregabalin] Swelling    Outpatient Encounter Medications as of 09/16/2022  Medication Sig   ALPRAZolam (XANAX) 1 MG tablet Take 1 mg by mouth 4 (four) times daily as needed for anxiety.   baclofen (LIORESAL) 10 MG tablet Take 10 mg by mouth 4 (four) times daily as needed for muscle spasms.   citalopram (CELEXA) 40 MG tablet Take 40 mg by mouth daily.   levothyroxine (SYNTHROID) 75 MCG tablet Take 1 tablet (75 mcg total) by mouth daily before breakfast.   OVER THE COUNTER MEDICATION Take 1 tablet by mouth 2 (two) times daily. OTC sinus medication   traZODone (DESYREL) 50 MG tablet Take 50-100 mg by mouth at bedtime as needed for sleep.   venlafaxine (EFFEXOR) 37.5 MG tablet Take 37.5 mg by mouth 2 (two) times daily.   No facility-administered encounter medications on file as of 09/16/2022.     Past Medical History:  Diagnosis Date   Anxiety    Chronic back pain    Chronic neck pain    Complication of anesthesia    pt states her O2 saturation drops after surgery   GERD (gastroesophageal reflux disease)    Headache    migraines- last one 2 months ago   Hypercholesteremia    Hypothyroidism    Kidney stones    Osteoporosis    Reflex sympathetic dystrophy    Tardive dyskinesia    Thyroid disease     Past Surgical History:  Procedure Laterality Date   ABDOMINAL HYSTERECTOMY     partial   APPENDECTOMY     BACK SURGERY     plates and screws   BREAST  SURGERY Left    fibrocystic tumor   CARPAL TUNNEL RELEASE Right 03/22/2019   Procedure: RIGHT CARPAL TUNNEL RELEASE;  Surgeon: Betha Loa, MD;  Location: [facility];  Service: Orthopedics;  Laterality: Right;  NO LOCAL   CHOLECYSTECTOMY     CYST EXCISION Left    foot   EAR CYST EXCISION Right 01/27/2013   Procedure: EXCISION CYST ON SCALP ;  Surgeon: Dalia Heading, MD;  Location: [facility];  Service: General;  Laterality: Right;   EAR CYST EXCISION Right 01/27/2013   Procedure: CYST REMOVAL right axilla;  Surgeon: Dalia Heading, MD;  Location: [facility];  Service: General;  Laterality: Right;   KNEE ARTHROSCOPY Left    NECK SURGERY     plate in neck   SALPINGOOPHORECTOMY Right    STRABISMUS SURGERY Bilateral    THYROIDECTOMY N/A 03/14/2014   Procedure: TOTAL THYROIDECTOMY;  Surgeon: Dalia Heading, MD;  Location: [facility];  Service: General;  Laterality: N/A;    Family History  Problem Relation Age of Onset   Breast cancer Mother    Parkinson's disease Father    Depression Sister     Social History   Social History Narrative   Lives alone.  Review of Systems General: Denies fevers, chills, weight loss CV: Denies chest pain, shortness of breath, palpitations Skin: Mass which has been present for an undetermined amount of time.  There is no pain no drainage no erythema  Physical Exam    09/16/2022   12:58 PM 08/12/2022   10:59 AM 08/05/2022    2:40 PM  Vitals with BMI  Height 5' 7.5" 5' 7.5" 5' 7.5"  Weight 140 lbs 143 lbs 10 oz 143 lbs  BMI 21.59 22.15 22.05  Systolic 103 122 161  Diastolic 64 78 70  Pulse 105 84 72    General:  No acute distress,  Alert and oriented, Non-Toxic, Normal speech and affect Skin: Patient has a 2 x 2 centimeter soft tissue mass consistent with a lipoma.  There is no overlying skin change no erythema and it does not appear to be fixed to any underlying structures. Mammogram: Mammogram in October 2023 was BI-RADS  2 Assessment/Plan Soft tissue mass: Right mandibular border.  This mass is consistent with a lipoma.  I believe it is readily amenable to excision in the office.  We can do this under local.  She is happy with this plan.  She understands will be a scar and there is always a chance of recurrence.  She also understands if this is something other than the lipoma she may require an additional procedure.  Will schedule at her request  Santiago Glad 09/16/2022, 4:31 PM

## 2022-09-29 DIAGNOSIS — F419 Anxiety disorder, unspecified: Secondary | ICD-10-CM | POA: Diagnosis not present

## 2022-09-29 DIAGNOSIS — E063 Autoimmune thyroiditis: Secondary | ICD-10-CM | POA: Diagnosis not present

## 2022-10-11 ENCOUNTER — Encounter: Payer: Self-pay | Admitting: Neurology

## 2022-10-21 ENCOUNTER — Encounter: Payer: Self-pay | Admitting: Plastic Surgery

## 2022-10-21 ENCOUNTER — Ambulatory Visit: Payer: Medicare HMO | Admitting: Plastic Surgery

## 2022-10-21 ENCOUNTER — Other Ambulatory Visit: Payer: Self-pay

## 2022-10-21 VITALS — BP 123/76 | HR 91

## 2022-10-21 DIAGNOSIS — L989 Disorder of the skin and subcutaneous tissue, unspecified: Secondary | ICD-10-CM

## 2022-10-21 DIAGNOSIS — D489 Neoplasm of uncertain behavior, unspecified: Secondary | ICD-10-CM

## 2022-10-21 DIAGNOSIS — L720 Epidermal cyst: Secondary | ICD-10-CM | POA: Diagnosis not present

## 2022-10-21 DIAGNOSIS — L820 Inflamed seborrheic keratosis: Secondary | ICD-10-CM

## 2022-10-21 NOTE — Progress Notes (Signed)
Procedure Note  Preoperative Dx: Right submandibular mass, left back skin lesion  Postoperative Dx: Same  Procedure: Excision of right submandibular mass, excision left back skin lesion  Anesthesia: Lidocaine 1% with 1:100,000 epinephrine and 0.25% Sensorcaine   Indication for Procedure: Removal for pathologic diagnosis  Description of Procedure: Risks and complications were explained to the patient need for additional procedures depending on pathology and possibility of recurrence.  Consent was confirmed and the patient understands the risks and benefits.  The potential complications and alternatives were explained and the patient consents.  The patient expressed understanding the option of not having the procedure and the risks of a scar.  Time out was called and all information was confirmed to be correct.    The area was prepped and drapped.  Local anesthetic was injected in the subcutaneous tissues.  After waiting for the local to take affect the right submandibular mass was addressed first.  An incision was made over the mass and dissection carried out down through the subcutaneous tissues.  The mass was a cyst with a significant amount of sebaceous material.  The entire cyst wall was removed with a combination of sharp and blunt dissection and the wound was irrigated with peroxide.  The incision was closed with interrupted 5-0 Monocryl suture in the dermis and interrupted 5-0 Prolene suture and the skin.  The surgical wound measured approximately 2 cm.  Attention was then turned to the back skin lesion.  The skin lesion appeared to be a seborrheic keratosis however the patient was concerned about rapid pigment change within the lesion.  She requested that I excised the skin lesion.  The subcutaneous tissues were infiltrated with local anesthesia and an elliptical incision was made around the lesion.  The entire skin lesion was removed and was marked with a long suture on the lateral border and  a short suture on the superior border.  Limited undermining of the skin around the border of the surgical site was performed with the scissors.  After obtaining hemostasis, the surgical wound was closed with 3-0 Monocryl sutures in the dermis and a running 4-0 Monocryl subcuticular stitch.  The incision was sealed with Dermabond.  The surgical wound measured 3 cm.  A dressing was applied.  The patient was given instructions on how to care for the area and a follow up appointment.  [name] tolerated the procedure well and there were no complications. The specimen was sent to pathology.

## 2022-10-28 ENCOUNTER — Ambulatory Visit: Payer: Medicare HMO | Admitting: Student

## 2022-10-28 NOTE — Progress Notes (Deleted)
Patient is a 52 year old female who recently underwent excision of right submandibular mass and excision of left back skin lesion with Dr. Ladona Ridgel on 10/21/2022.  During the procedure, the submandibular mass was excised and the incision was closed with 5-0 Monocryl suture in the dermis and interrupted 5-0 Prolene in the skin.  The back skin lesion was then excised and the wound was closed with 3-0 Monocryl sutures in the dermis and a running 4-0 Monocryl subcuticular stitch.  The incision was sealed with Dermabond.  Specimen was sent to pathology.  Pathology results are still pending.  Patient presents to the clinic today for postprocedural follow-up.  Today,

## 2022-10-29 ENCOUNTER — Ambulatory Visit (INDEPENDENT_AMBULATORY_CARE_PROVIDER_SITE_OTHER): Payer: Medicare HMO | Admitting: Surgical

## 2022-10-29 DIAGNOSIS — Z9889 Other specified postprocedural states: Secondary | ICD-10-CM

## 2022-10-29 DIAGNOSIS — D489 Neoplasm of uncertain behavior, unspecified: Secondary | ICD-10-CM

## 2022-10-29 NOTE — Progress Notes (Signed)
52 year old female here for follow-up after excision of right submandibular mass and excision of left skin of back with Dr. Ladona Ridgel on 10/21/2022.  She is 8 days postop.  She reports she is doing well.  She reports she has had some itching of the incisions but no other complaints.  She was informed that pathology results are not yet available.  On exam incisions are intact and healing well.  There is no erythema or cellulitic changes noted.  No subcutaneous fluid collections noted.  Prolene sutures were removed from right submandibular mass, patient tolerated this well.  Sutures were left in place of left skin of back incision.  Recommend light massage to the right submandibular mass in 5 days.  Recommend use of sunscreen and scar cream.  We will plan to follow-up with her in 1 week via telephone to discuss pathology results.  Patient is aware that results are not yet available.

## 2022-11-06 ENCOUNTER — Ambulatory Visit (INDEPENDENT_AMBULATORY_CARE_PROVIDER_SITE_OTHER): Payer: Medicare HMO | Admitting: Surgical

## 2022-11-06 DIAGNOSIS — D489 Neoplasm of uncertain behavior, unspecified: Secondary | ICD-10-CM

## 2022-11-06 DIAGNOSIS — L989 Disorder of the skin and subcutaneous tissue, unspecified: Secondary | ICD-10-CM

## 2022-11-06 DIAGNOSIS — Z9889 Other specified postprocedural states: Secondary | ICD-10-CM

## 2022-11-06 NOTE — Progress Notes (Signed)
52 year old female here for virtual visit via telephone to discuss pathology results from her procedure on 10/21/2022 with Dr. Ladona Ridgel.  The patient gave consent to have this visit done by telemedicine / virtual visit, two identifiers were used to identify patient. This is also consent for access the chart and treat the patient via this visit. The patient is located in [place].  I, the provider, am at the office.  We spent 4 minutes together for the visit.  Joined by telephone.  We discussed pathology result. 1. Skin (M), right submandibular region EPIDERMAL INCLUSION CYST 2. Skin (M), left mid back SEBORRHEIC KERATOSIS, IRRITATED, MARGINS FREE  All of her questions were answered to her content.  She reports she is doing well.  Recommend following up as needed.

## 2022-11-08 ENCOUNTER — Ambulatory Visit: Payer: Medicare HMO | Attending: Internal Medicine | Admitting: Internal Medicine

## 2022-11-21 ENCOUNTER — Ambulatory Visit: Payer: Medicare HMO | Admitting: Neurology

## 2022-12-31 ENCOUNTER — Encounter: Payer: Self-pay | Admitting: Neurology

## 2022-12-31 ENCOUNTER — Ambulatory Visit: Payer: Medicare HMO | Admitting: Neurology

## 2023-01-07 ENCOUNTER — Ambulatory Visit: Payer: Medicare HMO | Admitting: Internal Medicine

## 2023-01-14 ENCOUNTER — Ambulatory Visit: Payer: Medicare HMO | Admitting: "Endocrinology

## 2023-01-28 DIAGNOSIS — I10 Essential (primary) hypertension: Secondary | ICD-10-CM | POA: Diagnosis not present

## 2023-01-28 DIAGNOSIS — F1320 Sedative, hypnotic or anxiolytic dependence, uncomplicated: Secondary | ICD-10-CM | POA: Diagnosis not present

## 2023-01-28 DIAGNOSIS — F5101 Primary insomnia: Secondary | ICD-10-CM | POA: Diagnosis not present

## 2023-02-14 ENCOUNTER — Other Ambulatory Visit: Payer: Self-pay | Admitting: Nurse Practitioner

## 2023-02-14 ENCOUNTER — Other Ambulatory Visit: Payer: Self-pay | Admitting: *Deleted

## 2023-02-14 ENCOUNTER — Telehealth: Payer: Self-pay | Admitting: "Endocrinology

## 2023-02-14 DIAGNOSIS — E890 Postprocedural hypothyroidism: Secondary | ICD-10-CM

## 2023-02-14 MED ORDER — LEVOTHYROXINE SODIUM 75 MCG PO TABS: 75.0000 ug | ORAL_TABLET | Freq: Every day | ORAL | 0 refills | Status: AC

## 2023-02-14 NOTE — Telephone Encounter (Signed)
Can you send in 5 pills of her thyroid medication for pt? She is getting her labs done tomorrow and has an appt 11/21. [place] [facility].

## 2023-02-14 NOTE — Telephone Encounter (Signed)
I have sent in her medication to [facility] in [place].

## 2023-02-20 ENCOUNTER — Ambulatory Visit: Payer: Medicare HMO | Admitting: "Endocrinology

## 2023-03-17 ENCOUNTER — Encounter: Payer: Self-pay | Admitting: *Deleted

## 2023-03-31 DIAGNOSIS — J22 Unspecified acute lower respiratory infection: Secondary | ICD-10-CM | POA: Diagnosis not present

## 2023-03-31 DIAGNOSIS — R059 Cough, unspecified: Secondary | ICD-10-CM | POA: Diagnosis not present

## 2023-03-31 DIAGNOSIS — J329 Chronic sinusitis, unspecified: Secondary | ICD-10-CM | POA: Diagnosis not present

## 2023-05-05 DIAGNOSIS — R0789 Other chest pain: Secondary | ICD-10-CM | POA: Diagnosis not present

## 2023-05-05 DIAGNOSIS — I10 Essential (primary) hypertension: Secondary | ICD-10-CM | POA: Diagnosis not present

## 2023-05-05 DIAGNOSIS — G459 Transient cerebral ischemic attack, unspecified: Secondary | ICD-10-CM | POA: Diagnosis not present

## 2023-05-05 DIAGNOSIS — F5101 Primary insomnia: Secondary | ICD-10-CM | POA: Diagnosis not present

## 2023-05-05 DIAGNOSIS — Z6822 Body mass index (BMI) 22.0-22.9, adult: Secondary | ICD-10-CM | POA: Diagnosis not present

## 2023-05-05 DIAGNOSIS — K219 Gastro-esophageal reflux disease without esophagitis: Secondary | ICD-10-CM | POA: Diagnosis not present

## 2023-05-05 DIAGNOSIS — F1320 Sedative, hypnotic or anxiolytic dependence, uncomplicated: Secondary | ICD-10-CM | POA: Diagnosis not present

## 2023-05-05 DIAGNOSIS — L723 Sebaceous cyst: Secondary | ICD-10-CM | POA: Diagnosis not present

## 2023-05-05 DIAGNOSIS — G9059 Complex regional pain syndrome I of other specified site: Secondary | ICD-10-CM | POA: Diagnosis not present

## 2023-07-09 DIAGNOSIS — G5603 Carpal tunnel syndrome, bilateral upper limbs: Secondary | ICD-10-CM | POA: Diagnosis not present

## 2023-08-05 DIAGNOSIS — G5602 Carpal tunnel syndrome, left upper limb: Secondary | ICD-10-CM | POA: Diagnosis not present

## 2023-08-05 DIAGNOSIS — K219 Gastro-esophageal reflux disease without esophagitis: Secondary | ICD-10-CM | POA: Diagnosis not present

## 2023-08-08 DIAGNOSIS — G8918 Other acute postprocedural pain: Secondary | ICD-10-CM | POA: Diagnosis not present

## 2023-08-19 DIAGNOSIS — Z4889 Encounter for other specified surgical aftercare: Secondary | ICD-10-CM | POA: Diagnosis not present

## 2023-09-05 DIAGNOSIS — S60219A Contusion of unspecified wrist, initial encounter: Secondary | ICD-10-CM | POA: Diagnosis not present

## 2023-09-05 DIAGNOSIS — Z4889 Encounter for other specified surgical aftercare: Secondary | ICD-10-CM | POA: Diagnosis not present

## 2023-09-23 DIAGNOSIS — F1320 Sedative, hypnotic or anxiolytic dependence, uncomplicated: Secondary | ICD-10-CM | POA: Diagnosis not present

## 2023-09-23 DIAGNOSIS — F5101 Primary insomnia: Secondary | ICD-10-CM | POA: Diagnosis not present

## 2023-09-23 DIAGNOSIS — I10 Essential (primary) hypertension: Secondary | ICD-10-CM | POA: Diagnosis not present

## 2023-09-23 DIAGNOSIS — F419 Anxiety disorder, unspecified: Secondary | ICD-10-CM | POA: Diagnosis not present

## 2023-09-23 DIAGNOSIS — G894 Chronic pain syndrome: Secondary | ICD-10-CM | POA: Diagnosis not present

## 2023-12-28 ENCOUNTER — Emergency Department (HOSPITAL_COMMUNITY)

## 2023-12-28 ENCOUNTER — Inpatient Hospital Stay (HOSPITAL_COMMUNITY)
Admission: EM | Admit: 2023-12-28 | Discharge: 2023-12-31 | DRG: 062 | Disposition: A | Discharge status: Home / Self Care | Attending: Neurology | Admitting: Neurology

## 2023-12-28 ENCOUNTER — Encounter (HOSPITAL_COMMUNITY): Payer: Self-pay | Admitting: *Deleted

## 2023-12-28 DIAGNOSIS — Z7982 Long term (current) use of aspirin: Secondary | ICD-10-CM | POA: Diagnosis not present

## 2023-12-28 DIAGNOSIS — R4702 Dysphasia: Secondary | ICD-10-CM | POA: Diagnosis not present

## 2023-12-28 DIAGNOSIS — R471 Dysarthria and anarthria: Secondary | ICD-10-CM | POA: Diagnosis present

## 2023-12-28 DIAGNOSIS — F329 Major depressive disorder, single episode, unspecified: Secondary | ICD-10-CM | POA: Diagnosis not present

## 2023-12-28 DIAGNOSIS — Q2112 Patent foramen ovale: Secondary | ICD-10-CM

## 2023-12-28 DIAGNOSIS — F419 Anxiety disorder, unspecified: Secondary | ICD-10-CM | POA: Diagnosis present

## 2023-12-28 DIAGNOSIS — I63512 Cerebral infarction due to unspecified occlusion or stenosis of left middle cerebral artery: Principal | ICD-10-CM | POA: Diagnosis present

## 2023-12-28 DIAGNOSIS — R031 Nonspecific low blood-pressure reading: Secondary | ICD-10-CM | POA: Diagnosis not present

## 2023-12-28 DIAGNOSIS — K219 Gastro-esophageal reflux disease without esophagitis: Secondary | ICD-10-CM | POA: Diagnosis present

## 2023-12-28 DIAGNOSIS — M810 Age-related osteoporosis without current pathological fracture: Secondary | ICD-10-CM | POA: Diagnosis present

## 2023-12-28 DIAGNOSIS — I639 Cerebral infarction, unspecified: Secondary | ICD-10-CM | POA: Diagnosis present

## 2023-12-28 DIAGNOSIS — G8194 Hemiplegia, unspecified affecting left nondominant side: Secondary | ICD-10-CM | POA: Diagnosis not present

## 2023-12-28 DIAGNOSIS — Z79899 Other long term (current) drug therapy: Secondary | ICD-10-CM

## 2023-12-28 DIAGNOSIS — Z820 Family history of epilepsy and other diseases of the nervous system: Secondary | ICD-10-CM | POA: Diagnosis not present

## 2023-12-28 DIAGNOSIS — Z8673 Personal history of transient ischemic attack (TIA), and cerebral infarction without residual deficits: Secondary | ICD-10-CM

## 2023-12-28 DIAGNOSIS — F17210 Nicotine dependence, cigarettes, uncomplicated: Secondary | ICD-10-CM | POA: Diagnosis present

## 2023-12-28 DIAGNOSIS — Z888 Allergy status to other drugs, medicaments and biological substances status: Secondary | ICD-10-CM

## 2023-12-28 DIAGNOSIS — R4701 Aphasia: Secondary | ICD-10-CM | POA: Diagnosis present

## 2023-12-28 DIAGNOSIS — Z79890 Hormone replacement therapy (postmenopausal): Secondary | ICD-10-CM

## 2023-12-28 DIAGNOSIS — R29701 NIHSS score 1: Secondary | ICD-10-CM | POA: Diagnosis not present

## 2023-12-28 DIAGNOSIS — F17200 Nicotine dependence, unspecified, uncomplicated: Secondary | ICD-10-CM | POA: Insufficient documentation

## 2023-12-28 DIAGNOSIS — R9431 Abnormal electrocardiogram [ECG] [EKG]: Secondary | ICD-10-CM | POA: Diagnosis not present

## 2023-12-28 DIAGNOSIS — R29708 NIHSS score 8: Secondary | ICD-10-CM | POA: Diagnosis present

## 2023-12-28 DIAGNOSIS — R29818 Other symptoms and signs involving the nervous system: Secondary | ICD-10-CM | POA: Diagnosis not present

## 2023-12-28 DIAGNOSIS — R4781 Slurred speech: Secondary | ICD-10-CM | POA: Diagnosis not present

## 2023-12-28 DIAGNOSIS — R519 Headache, unspecified: Secondary | ICD-10-CM | POA: Diagnosis not present

## 2023-12-28 DIAGNOSIS — I6782 Cerebral ischemia: Secondary | ICD-10-CM | POA: Diagnosis not present

## 2023-12-28 DIAGNOSIS — E7800 Pure hypercholesterolemia, unspecified: Secondary | ICD-10-CM | POA: Diagnosis present

## 2023-12-28 DIAGNOSIS — F1410 Cocaine abuse, uncomplicated: Secondary | ICD-10-CM | POA: Diagnosis present

## 2023-12-28 DIAGNOSIS — E039 Hypothyroidism, unspecified: Secondary | ICD-10-CM | POA: Diagnosis not present

## 2023-12-28 DIAGNOSIS — Z7401 Bed confinement status: Secondary | ICD-10-CM | POA: Diagnosis not present

## 2023-12-28 DIAGNOSIS — R414 Neurologic neglect syndrome: Secondary | ICD-10-CM | POA: Diagnosis not present

## 2023-12-28 DIAGNOSIS — I63432 Cerebral infarction due to embolism of left posterior cerebral artery: Secondary | ICD-10-CM | POA: Diagnosis not present

## 2023-12-28 DIAGNOSIS — I6523 Occlusion and stenosis of bilateral carotid arteries: Secondary | ICD-10-CM | POA: Diagnosis not present

## 2023-12-28 DIAGNOSIS — E785 Hyperlipidemia, unspecified: Secondary | ICD-10-CM | POA: Diagnosis not present

## 2023-12-28 DIAGNOSIS — R531 Weakness: Secondary | ICD-10-CM

## 2023-12-28 DIAGNOSIS — Z886 Allergy status to analgesic agent status: Secondary | ICD-10-CM | POA: Diagnosis not present

## 2023-12-28 DIAGNOSIS — I6389 Other cerebral infarction: Secondary | ICD-10-CM | POA: Diagnosis not present

## 2023-12-28 DIAGNOSIS — I7090 Unspecified atherosclerosis: Secondary | ICD-10-CM | POA: Diagnosis not present

## 2023-12-28 DIAGNOSIS — G2401 Drug induced subacute dyskinesia: Secondary | ICD-10-CM | POA: Diagnosis present

## 2023-12-28 DIAGNOSIS — R410 Disorientation, unspecified: Secondary | ICD-10-CM | POA: Diagnosis not present

## 2023-12-28 DIAGNOSIS — I6502 Occlusion and stenosis of left vertebral artery: Secondary | ICD-10-CM | POA: Diagnosis not present

## 2023-12-28 LAB — COMPREHENSIVE METABOLIC PANEL WITH GFR
ALT: 13 U/L (ref 0–44)
AST: 18 U/L (ref 15–41)
Albumin: 3.7 g/dL (ref 3.5–5.0)
Alkaline Phosphatase: 96 U/L (ref 38–126)
Anion gap: 6 (ref 5–15)
BUN: <5 mg/dL — ABNORMAL LOW (ref 6–20)
CO2: 26 mmol/L (ref 22–32)
Calcium: 8.3 mg/dL — ABNORMAL LOW (ref 8.9–10.3)
Chloride: 104 mmol/L (ref 98–111)
Creatinine, Ser: 0.93 mg/dL (ref 0.44–1.00)
GFR, Estimated: >60 mL/min (ref >60)
Glucose, Bld: 83 mg/dL (ref 70–99)
Potassium: 3 mmol/L — ABNORMAL LOW (ref 3.5–5.1)
Sodium: 136 mmol/L (ref 135–145)
Total Bilirubin: 0.7 mg/dL (ref 0.0–1.2)
Total Protein: 6.6 g/dL (ref 6.5–8.1)

## 2023-12-28 LAB — HEMOGLOBIN A1C
Hgb A1c MFr Bld: 5 % (ref 4.8–5.6)
Mean Plasma Glucose: 96.8 mg/dL

## 2023-12-28 LAB — CBC
HCT: 40 % (ref 36.0–46.0)
Hemoglobin: 13.7 g/dL (ref 12.0–15.0)
MCH: 32.5 pg (ref 26.0–34.0)
MCHC: 34.3 g/dL (ref 30.0–36.0)
MCV: 94.8 fL (ref 80.0–100.0)
Platelets: 197 K/uL (ref 150–400)
RBC: 4.22 MIL/uL (ref 3.87–5.11)
RDW: 13.1 % (ref 11.5–15.5)
WBC: 6.2 K/uL (ref 4.0–10.5)
nRBC: 0 % (ref 0.0–0.2)

## 2023-12-28 LAB — DIFFERENTIAL
Abs Immature Granulocytes: 0.02 K/uL (ref 0.00–0.07)
Basophils Absolute: 0 K/uL (ref 0.0–0.1)
Basophils Relative: 1 %
Eosinophils Absolute: 0.2 K/uL (ref 0.0–0.5)
Eosinophils Relative: 2 %
Immature Granulocytes: 0 %
Lymphocytes Relative: 35 %
Lymphs Abs: 2.2 K/uL (ref 0.7–4.0)
Monocytes Absolute: 0.5 K/uL (ref 0.1–1.0)
Monocytes Relative: 7 %
Neutro Abs: 3.4 K/uL (ref 1.7–7.7)
Neutrophils Relative %: 55 %

## 2023-12-28 LAB — RAPID URINE DRUG SCREEN, HOSP PERFORMED
Amphetamines: NOT DETECTED
Barbiturates: NOT DETECTED
Benzodiazepines: POSITIVE — AB
Cocaine: POSITIVE — AB
Opiates: NOT DETECTED
Tetrahydrocannabinol: NOT DETECTED

## 2023-12-28 LAB — APTT: aPTT: 25 s (ref 24–36)

## 2023-12-28 LAB — PROTIME-INR
INR: 0.9 (ref 0.8–1.2)
Prothrombin Time: 12.4 s (ref 11.4–15.2)

## 2023-12-28 LAB — CBG MONITORING, ED: Glucose-Capillary: 107 mg/dL — ABNORMAL HIGH (ref 70–99)

## 2023-12-28 LAB — MRSA NEXT GEN BY PCR, NASAL: MRSA by PCR Next Gen: NOT DETECTED

## 2023-12-28 LAB — HIV ANTIBODY (ROUTINE TESTING W REFLEX): HIV Screen 4th Generation wRfx: NONREACTIVE

## 2023-12-28 MED ORDER — STROKE: EARLY STAGES OF RECOVERY BOOK
Freq: Once | Status: AC
  Filled 2023-12-28: qty 1

## 2023-12-28 MED ORDER — BACLOFEN 10 MG PO TABS: 10.0000 mg | ORAL_TABLET | Freq: Three times a day (TID) | ORAL | Status: DC | PRN

## 2023-12-28 MED ORDER — PANTOPRAZOLE SODIUM 40 MG IV SOLR
40.0000 mg | Freq: Every day | INTRAVENOUS | Status: DC
  Administered 2023-12-28: 40 mg via INTRAVENOUS
  Filled 2023-12-28: qty 10

## 2023-12-28 MED ORDER — TRAZODONE HCL 50 MG PO TABS
50.0000 mg | ORAL_TABLET | Freq: Every evening | ORAL | Status: DC | PRN
  Administered 2023-12-29 – 2023-12-30 (×2): 100 mg via ORAL
  Filled 2023-12-28 (×2): qty 2

## 2023-12-28 MED ORDER — POTASSIUM CHLORIDE CRYS ER 20 MEQ PO TBCR
40.0000 meq | EXTENDED_RELEASE_TABLET | Freq: Once | ORAL | Status: AC
  Administered 2023-12-28: 40 meq via ORAL
  Filled 2023-12-28: qty 2

## 2023-12-28 MED ORDER — VENLAFAXINE HCL 37.5 MG PO TABS
37.5000 mg | ORAL_TABLET | Freq: Two times a day (BID) | ORAL | Status: DC
  Administered 2023-12-28 – 2023-12-31 (×6): 37.5 mg via ORAL
  Filled 2023-12-28 (×7): qty 1

## 2023-12-28 MED ORDER — IOHEXOL 350 MG/ML SOLN
75.0000 mL | Freq: Once | INTRAVENOUS | Status: AC | PRN
Start: 2023-12-28 — End: 2023-12-28
  Administered 2023-12-28: 75 mL via INTRAVENOUS

## 2023-12-28 MED ORDER — TENECTEPLASE FOR STROKE
PACK | INTRAVENOUS | Status: AC
  Administered 2023-12-28: 17 mg via INTRAVENOUS
  Filled 2023-12-28: qty 10

## 2023-12-28 MED ORDER — SODIUM CHLORIDE 0.9 % IV SOLN: INTRAVENOUS | Status: DC

## 2023-12-28 MED ORDER — ORAL CARE MOUTH RINSE: 15.0000 mL | OROMUCOSAL | Status: DC | PRN

## 2023-12-28 MED ORDER — ALPRAZOLAM 0.25 MG PO TABS
1.0000 mg | ORAL_TABLET | Freq: Four times a day (QID) | ORAL | Status: DC | PRN
  Administered 2023-12-28 – 2023-12-30 (×3): 1 mg via ORAL
  Filled 2023-12-28: qty 2
  Filled 2023-12-28: qty 4
  Filled 2023-12-28: qty 2

## 2023-12-28 MED ORDER — LEVOTHYROXINE SODIUM 75 MCG PO TABS
75.0000 ug | ORAL_TABLET | Freq: Every day | ORAL | Status: DC
  Administered 2023-12-29 – 2023-12-31 (×2): 75 ug via ORAL
  Filled 2023-12-28 (×2): qty 1

## 2023-12-28 MED ORDER — NICOTINE 21 MG/24HR TD PT24
21.0000 mg | MEDICATED_PATCH | Freq: Every day | TRANSDERMAL | Status: DC
  Administered 2023-12-28 – 2023-12-31 (×4): 21 mg via TRANSDERMAL
  Filled 2023-12-28 (×4): qty 1

## 2023-12-28 MED ORDER — ACETAMINOPHEN 500 MG PO TABS
1000.0000 mg | ORAL_TABLET | Freq: Once | ORAL | Status: AC
  Administered 2023-12-28: 1000 mg via ORAL
  Filled 2023-12-28: qty 2

## 2023-12-28 MED ORDER — NICOTINE 21 MG/24HR TD PT24: 21.0000 mg | MEDICATED_PATCH | Freq: Every day | TRANSDERMAL | Status: DC

## 2023-12-28 MED ORDER — BACLOFEN 10 MG PO TABS: 10.0000 mg | ORAL_TABLET | Freq: Four times a day (QID) | ORAL | Status: DC | PRN

## 2023-12-28 MED ORDER — TENECTEPLASE FOR STROKE: 0.2500 mg/kg | PACK | Freq: Once | INTRAVENOUS | Status: AC

## 2023-12-28 MED ORDER — SENNOSIDES-DOCUSATE SODIUM 8.6-50 MG PO TABS: 1.0000 | ORAL_TABLET | Freq: Every evening | ORAL | Status: DC | PRN

## 2023-12-28 MED ORDER — CITALOPRAM HYDROBROMIDE 10 MG PO TABS
40.0000 mg | ORAL_TABLET | Freq: Every day | ORAL | Status: DC
  Administered 2023-12-29 – 2023-12-31 (×3): 40 mg via ORAL
  Filled 2023-12-28 (×3): qty 4

## 2023-12-28 NOTE — ED Notes (Signed)
 Code Stroke called by EMS at 1438. Arrived to ED at 1450.

## 2023-12-28 NOTE — ED Notes (Addendum)
 Dr. Matthews speaking with son and pt about TNK benefits, and side effects. LKN now confirmed as 1300 (change from 1400). No changes in pt presentation. Pt alert, NAD, calm, interactive, resps e/u.

## 2023-12-28 NOTE — ED Notes (Signed)
 To CTA via stretcher, no untoward changes. Neuro exam improved since TNK. Alert, NAD, calm, interactive, resps e/u.

## 2023-12-28 NOTE — ED Provider Notes (Signed)
 [place] EMERGENCY DEPARTMENT AT [facility] Provider Note  CSN: [id number] Arrival date & time: 12/28/23 1452  Chief Complaint(s) Code Stroke  HPI [name] is a 53 y.o. female history of hypothyroidism presenting to the emergency department with speech abnormality.  Patient apparently last spoke to someone around 1 PM and had normal speech.  Son spoke to her few days ago.  EMS was called and brought her to the ER and activated code stroke.  Patient not able to speak very intelligibly but is able to indicate to neurologist that she will spoke to someone around 1 PM.  When asked if she has pain she points to the left side of her head, shakes her head no when asked about chest pain, back pain, abdominal pain, difficulty breathing.  History limited due to speech abnormality   Past Medical History Past Medical History:  Diagnosis Date   Anxiety    Chronic back pain    Chronic neck pain    Complication of anesthesia    pt states her O2 saturation drops after surgery   GERD (gastroesophageal reflux disease)    Headache    migraines- last one 2 months ago   Hypercholesteremia    Hypothyroidism    Kidney stones    Osteoporosis    Reflex sympathetic dystrophy    Tardive dyskinesia    Thyroid  disease    Patient Active Problem List   Diagnosis Date Noted   Acute ischemic stroke (HCC) 12/28/2023   Postsurgical hypothyroidism 08/05/2022   Bilateral carpal tunnel syndrome 03/03/2019   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 Authorizing Provider  ALPRAZolam  (XANAX ) 1 MG tablet Take 1 mg by mouth 4 (four) times daily as needed for anxiety.    [provider]  baclofen (LIORESAL) 10 MG tablet Take 10 mg by mouth 4 (four) times daily as needed for muscle spasms.    [provider]  citalopram  (CELEXA ) 40 MG tablet Take 40 mg by mouth daily.    [provider]  levothyroxine  (SYNTHROID ) 75 MCG tablet Take 1 tablet (75 mcg total) by mouth daily before breakfast. 02/14/23   Nida, Gebreselassie W, MD  OVER THE COUNTER MEDICATION Take 1 tablet by mouth 2 (two) times daily. OTC sinus medication    [provider]  traZODone  (DESYREL ) 50 MG tablet Take 50-100 mg by mouth at bedtime as needed for sleep.    [provider]  venlafaxine (EFFEXOR) 37.5 MG tablet Take 37.5 mg by mouth 2 (two) times daily.    [provider]                                                                                                                                    Past Surgical History Past Surgical History:  Procedure Laterality Date   ABDOMINAL HYSTERECTOMY  partial   APPENDECTOMY     BACK SURGERY     plates and screws   BREAST SURGERY Left    fibrocystic tumor   CARPAL TUNNEL RELEASE Right 03/22/2019   Procedure: RIGHT CARPAL TUNNEL RELEASE;  Surgeon: Murrell Drivers, MD;  Location: [facility];  Service: Orthopedics;  Laterality: Right;  NO LOCAL   CHOLECYSTECTOMY     CYST EXCISION Left    foot   EAR CYST EXCISION Right 01/27/2013   Procedure: EXCISION CYST ON SCALP ;  Surgeon: Oneil DELENA Budge, MD;  Location: [facility];  Service: General;  Laterality: Right;   EAR CYST EXCISION Right 01/27/2013   Procedure: CYST REMOVAL right axilla;  Surgeon: Oneil DELENA Budge, MD;  Location: [facility];  Service: General;  Laterality: Right;   KNEE ARTHROSCOPY Left    NECK SURGERY     plate in neck   SALPINGOOPHORECTOMY Right    STRABISMUS SURGERY Bilateral    THYROIDECTOMY N/A 03/14/2014   Procedure: TOTAL THYROIDECTOMY;  Surgeon: Oneil DELENA Budge, MD;  Location: [facility];  Service: General;  Laterality: N/A;   Family History Family History  Problem Relation Age of Onset   Breast cancer Mother    Parkinson's disease Father    Depression Sister     Social History Social History   Tobacco Use   Smoking status: Every Day     Current packs/day: 0.50    Average packs/day: 0.5 packs/day for 20.0 years (10.0 ttl pk-yrs)    Types: Cigarettes   Smokeless tobacco: Never  Substance Use Topics   Alcohol use: Yes    Comment: occasionally   Drug use: No    Comment: prescription Rx   Allergies Darvocet [propoxyphene n-acetaminophen ] and Lyrica [pregabalin]  Review of Systems Review of Systems  Unable to perform ROS: Patient nonverbal    Physical Exam Vital Signs  I have reviewed the triage vital signs BP 128/62   Pulse 63   Temp (!) 97.5 F (36.4 C)   Resp 16   Wt 69.5 kg   SpO2 96%   BMI 23.66 kg/m  Physical Exam Vitals and nursing note reviewed.  Constitutional:      General: She is not in acute distress.    Appearance: She is well-developed.  HENT:     Head: Normocephalic and atraumatic.     Mouth/Throat:     Mouth: Mucous membranes are moist.  Eyes:     Pupils: Pupils are equal, round, and reactive to light.  Cardiovascular:     Rate and Rhythm: Normal rate and regular rhythm.     Heart sounds: No murmur heard. Pulmonary:     Effort: Pulmonary effort is normal. No respiratory distress.     Breath sounds: Normal breath sounds.  Abdominal:     General: Abdomen is flat.     Palpations: Abdomen is soft.     Tenderness: There is no abdominal tenderness.  Musculoskeletal:        General: No tenderness.     Right lower leg: No edema.     Left lower leg: No edema.  Skin:    General: Skin is warm and dry.  Neurological:     Mental Status: She is alert.     Comments: Aphasic/unintelligible/slurred speech.  Cranial nerves otherwise intact, other than possible left tongue deviation.  Strength 4 out of 5 in bilateral upper and lower extremities  Psychiatric:        Mood and Affect: Mood normal.  Behavior: Behavior normal.     ED Results and Treatments Labs (all labs ordered are listed, but only abnormal results are displayed) Labs Reviewed  COMPREHENSIVE METABOLIC PANEL WITH GFR  - Abnormal; Notable for the following components:      Result Value   Potassium 3.0 (*)    BUN <5 (*)    Calcium  8.3 (*)    All other components within normal limits  RAPID URINE DRUG SCREEN, HOSP PERFORMED - Abnormal; Notable for the following components:   Cocaine POSITIVE (*)    Benzodiazepines POSITIVE (*)    All other components within normal limits  CBG MONITORING, ED - Abnormal; Notable for the following components:   Glucose-Capillary 107 (*)    All other components within normal limits  PROTIME-INR  APTT  CBC  DIFFERENTIAL  I-STAT CHEM 8, ED                                                                                                                          Radiology CT ANGIO HEAD NECK W WO CM Result Date: 12/28/2023 EXAM: CTA HEAD AND NECK WITH AND WITHOUT 12/28/2023 04:54:56 PM TECHNIQUE: CTA of the head and neck was performed with and without the administration of intravenous contrast. 75 mL of iohexol (OMNIPAQUE) 350 MG/ML injection was administered. Multiplanar 2D and/or 3D reformatted images are provided for review. Automated exposure control, iterative reconstruction, and/or weight based adjustment of the mA/kV was utilized to reduce the radiation dose to as low as reasonably achievable. Stenosis of the internal carotid arteries measured using NASCET criteria. COMPARISON: None available CLINICAL HISTORY: Acute neuro deficit with stroke suspected, presenting with disabling dysarthria and mild left-sided weakness. The patient is a 53 year old woman with a history of anxiety, chronic back and neck pain, hypercholesterolemia, hypothyroidism, tardive dyskinesia, reflex sympathetic dystrophy, and remote CVA without residual deficits. A prior head CT showed no acute intracranial process, no blood, and ASPECTS 10/10. FINDINGS: CTA NECK: AORTIC ARCH AND ARCH VESSELS: No dissection or arterial injury. No significant stenosis of the brachiocephalic or subclavian arteries. CERVICAL  CAROTID ARTERIES: A curvilinear defect is present at the right ICA; this may represent a focal web or contained dissection. There is no dissection in the more distal right ICA. The left ICA is patent without significant stenosis or dissection. No hemodynamically significant stenosis by NASCET criteria. CERVICAL VERTEBRAL ARTERIES: No dissection, arterial injury, or significant stenosis. LUNGS AND MEDIASTINUM: Unremarkable. SOFT TISSUES: Thyroidectomy. BONES: Cervical spine fusion is noted at C5-C6 and C6-C7. CTA HEAD: ANTERIOR CIRCULATION: Minimal atherosclerotic calcifications are present within the cavernous internal carotid arteries bilaterally. No significant stenosis of the anterior cerebral arteries. No significant stenosis of the middle cerebral arteries. No aneurysm. POSTERIOR CIRCULATION: No significant stenosis of the posterior cerebral arteries. No significant stenosis of the basilar artery. No significant stenosis of the vertebral arteries. No aneurysm. OTHER: No dural venous sinus thrombosis on this non-dedicated study. IMPRESSION: 1. No large vessel occlusion or hemodynamically significant stenosis in  the head or neck. 2. Curvilinear defect in the right ICA, possibly representing a focal web or contained dissection. No dissection in the more distal right ICA. 3. Minimal atherosclerotic calcifications within the cavernous internal carotid arteries bilaterally. Findings were called to Dr. Matthews at 5:30 pm Electronically signed by: Lonni Necessary MD 12/28/2023 05:38 PM EDT RP Workstation: [id number]   CT HEAD CODE STROKE WO CONTRAST Result Date: 12/28/2023 EXAM: CT HEAD WITHOUT CONTRAST 12/28/2023 03:07:44 PM TECHNIQUE: CT of the head was performed without the administration of intravenous contrast. Automated exposure control, iterative reconstruction, and/or weight based adjustment of the mA/kV was utilized to reduce the radiation dose to as low as reasonably achievable. COMPARISON: CT head  without contrast 05/05/2013. CLINICAL HISTORY: Neuro deficit, acute, stroke suspected. Slurred speech. Headache on left side. LKW 1400. FINDINGS: BRAIN AND VENTRICLES: No acute hemorrhage. No evidence of acute infarct. No hydrocephalus. No extra-axial collection. No mass effect or midline shift. ORBITS: No acute abnormality. SINUSES: No acute abnormality. SOFT TISSUES AND SKULL: No acute soft tissue abnormality. No skull fracture. [place] stroke program early CT (aspect) score Ganglionic (caudate, ic, Lentiform Nucleus, insula, M1-m3): 7 Supraganglionic (m4-m6): 3 Total: 10 IMPRESSION: 1. No acute intracranial abnormality.  ASPECTS 10/10 The pertinent results were texted to Dr. Matthews via the [facility] system at 3:14 pm. Electronically signed by: Lonni Necessary MD 12/28/2023 03:18 PM EDT RP Workstation: [id number]    Pertinent labs & imaging results that were available during my care of the patient were reviewed by me and considered in my medical decision making (see MDM for details).  Medications Ordered in ED Medications  potassium chloride  SA (KLOR-CON  M) CR tablet 40 mEq (has no administration in time range)  tenecteplase (TNKASE) injection for Stroke 17 mg (17 mg Intravenous Given 12/28/23 1538)  iohexol (OMNIPAQUE) 350 MG/ML injection 75 mL (75 mLs Intravenous Contrast Given 12/28/23 1645)  acetaminophen  (TYLENOL ) tablet 1,000 mg (1,000 mg Oral Given 12/28/23 1759)                                                                                                                                     Procedures .Critical Care  Performed by: Francesca Elsie CROME, MD Authorized by: Francesca Elsie CROME, MD   Critical care provider statement:    Critical care time (minutes):  30   Critical care was necessary to treat or prevent imminent or life-threatening deterioration of the following conditions:  CNS failure or compromise   Critical care was time spent personally by me on the following activities:   Development of treatment plan with patient or surrogate, discussions with consultants, evaluation of patient's response to treatment, examination of patient, ordering and review of laboratory studies, ordering and review of radiographic studies, ordering and performing treatments and interventions, pulse oximetry, re-evaluation of patient's condition and review of old charts   Care discussed with: admitting provider and accepting provider at another facility     (  including critical care time)  Medical Decision Making / ED Course   MDM:  53 year old presenting with slurred speech.  Differential includes intracranial bleeding, stroke, migraine, recrudescence.  Patient was activated as a code stroke and has been seen by neurology.  Initial CT scan negative for any acute bleed.  Neurology considering TNK as patient was within window.  Will reassess.  Clinical Course as of 12/28/23 1806  Sun Dec 28, 2023  1804 Symptoms improving. CTA with possible small carotid dissection vs web. Discussed with Dr. Matthews. Patient to be admitted to neuro ICU at [facility]. [WS]    Clinical Course User Index [WS] Francesca Elsie CROME, MD     Additional history obtained: -Additional history obtained from family and ems -External records from outside source obtained and reviewed including: Chart review including previous notes, labs, imaging, consultation notes including prior notes    Lab Tests: -I ordered, reviewed, and interpreted labs.   The pertinent results include:   Labs Reviewed  COMPREHENSIVE METABOLIC PANEL WITH GFR - Abnormal; Notable for the following components:      Result Value   Potassium 3.0 (*)    BUN <5 (*)    Calcium  8.3 (*)    All other components within normal limits  RAPID URINE DRUG SCREEN, HOSP PERFORMED - Abnormal; Notable for the following components:   Cocaine POSITIVE (*)    Benzodiazepines POSITIVE (*)    All other components within normal limits  CBG  MONITORING, ED - Abnormal; Notable for the following components:   Glucose-Capillary 107 (*)    All other components within normal limits  PROTIME-INR  APTT  CBC  DIFFERENTIAL  I-STAT CHEM 8, ED    Notable for mild hpyokalemia. + UDS   EKG   EKG Interpretation Date/Time:  Sunday December 28 2023 15:29:59 EDT Ventricular Rate:  67 PR Interval:  127 QRS Duration:  99 QT Interval:  448 QTC Calculation: 473 R Axis:   76  Text Interpretation: Sinus rhythm Confirmed by Francesca Elsie ([id number]) on 12/28/2023 4:19:35 PM         Imaging Studies ordered: I ordered imaging studies including CT head, CTA head  On my interpretation imaging demonstrates possible carotid dissection I independently visualized and interpreted imaging. I agree with the radiologist interpretation   Medicines ordered and prescription drug management: Meds ordered this encounter  Medications   tenecteplase (TNKASE) 50 MG injection for Stroke    Bluford Carrier A: cabinet override   tenecteplase (TNKASE) injection for Stroke 17 mg   iohexol (OMNIPAQUE) 350 MG/ML injection 75 mL   acetaminophen  (TYLENOL ) tablet 1,000 mg   potassium chloride  SA (KLOR-CON  M) CR tablet 40 mEq    -I have reviewed the patients home medicines and have made adjustments as needed   Consultations Obtained: I requested consultation with the neurologist,  and discussed lab and imaging findings as well as pertinent plan - they recommend: TNK, Admit ICU    Cardiac Monitoring: The patient was maintained on a cardiac monitor.  I personally viewed and interpreted the cardiac monitored which showed an underlying rhythm of: NSR  Social Determinants of Health:  Diagnosis or treatment significantly limited by social determinants of health: polysubstance abuse   Reevaluation: After the interventions noted above, I reevaluated the patient and found that their symptoms have improved  Co morbidities that complicate the patient  evaluation  Past Medical History:  Diagnosis Date   Anxiety    Chronic back pain    Chronic neck  pain    Complication of anesthesia    pt states her O2 saturation drops after surgery   GERD (gastroesophageal reflux disease)    Headache    migraines- last one 2 months ago   Hypercholesteremia    Hypothyroidism    Kidney stones    Osteoporosis    Reflex sympathetic dystrophy    Tardive dyskinesia    Thyroid  disease       Dispostion: Disposition decision including need for hospitalization was considered, and patient admitted to the hospital.    Final Clinical Impression(s) / ED Diagnoses Final diagnoses:  Acute ischemic stroke [facility])     This chart was dictated using voice recognition software.  Despite best efforts to proofread,  errors can occur which can change the documentation meaning.    Francesca Elsie CROME, MD 12/28/23 [phone]

## 2023-12-28 NOTE — ED Triage Notes (Signed)
 BIB [facility] from home for slurred speech/ aphasia, sudden onset 1400 while talking on phone with son. LKN 1400 perEMS. Code stroke called by EMS enroute at 1438. No other sx reported. EDP Dr. Franklyn at bridge on arrival.

## 2023-12-28 NOTE — ED Notes (Signed)
 Onto CT table for CTA at this time.

## 2023-12-28 NOTE — Plan of Care (Signed)
  Problem: Education: Goal: Knowledge of disease or condition will improve Outcome: Progressing Goal: Knowledge of patient specific risk factors will improve (DELETE if not current risk factor) Outcome: Progressing   Problem: Ischemic Stroke/TIA Tissue Perfusion: Goal: Complications of ischemic stroke/TIA will be minimized Outcome: Progressing   Problem: Coping: Goal: Will verbalize positive feelings about self Outcome: Progressing Goal: Will identify appropriate support needs Outcome: Progressing   Problem: Health Behavior/Discharge Planning: Goal: Goals will be collaboratively established with patient/family Outcome: Progressing   Problem: Self-Care: Goal: Ability to communicate needs accurately will improve Outcome: Progressing   Problem: Nutrition: Goal: Risk of aspiration will decrease Outcome: Progressing Goal: Dietary intake will improve Outcome: Progressing

## 2023-12-28 NOTE — ED Notes (Signed)
 From CT to ED room 10.

## 2023-12-28 NOTE — Consult Note (Addendum)
 NEUROLOGY TELESTROKE CONSULT NOTE   Date of service: December 28, 2023 Patient Name: [name] MRN:  [id number] DOB:  1971/02/22 Chief Complaint: dysarthria Requesting Provider: Francesca Elsie LITTIE, MD  Consult Participants: myself, patient, patient's son, bedside RN Location of the provider: [facility] Location of the patient: [facility]  This consult was provided via telemedicine with 2-way video and audio communication. The patient/family was informed that care would be provided in this way and agreed to receive care in this manner.   History of Present Illness   Code stroke page received: 1501 Neurologist on camera: 1506  This is a 53 year old woman with a past medical history of anxiety, chronic back pain and neck pain, hypercholesterolemia, hypothyroidism on Synthroid , tardive dyskinesia, reflex sympathetic dystrophy, remote CVA without residual deficits who presents with prominent dysarthria.  Patient has no intelligible speech but her comprehension is intact and she is able to answer questions appropriately with shaking her head yes or no and is also able to type out questions and answers appropriately on her phone.  Patient states that she was last able to speak to somebody normally at 1300 today.  Son spoke to her at 1400 today and noted that she had severe dysarthria and called EMS.  NIH stroke scale was 8.  She was not given any points for questions or best language because she was able to answer yes and no appropriately and typed out appropriate responses on her phone.  Head CT personally reviewed prior to TNK administration showed no acute intracranial process, no blood, ASPECTS 10/10.  Risks, benefits, and alternatives to TNK were discussed with patient and her son will at bedside who gave informed consent to proceed therefore TNK was administered. There was a delay in TNK administration 2/2 compromised ability to communicate 2/2 dysarthria. CTA is pending but exam was not consistent with  LVO therefore was not performed as part of the stroke code.  LKW: 1300 Modified rankin score: 1-No significant post stroke disability and can perform usual duties with stroke symptoms IV Thrombolysis: yes EVT: no CTA was not performed as part of the stroke code bc exam was not c/w LVO  NIHSS components Score: Comment  1a Level of Conscious 0[x]  1[]  2[]  3[]      1b LOC Questions 0[x]  1[]  2[]       1c LOC Commands 0[x]  1[]  2[]       2 Best Gaze 0[x]  1[]  2[]       3 Visual 0[x]  1[]  2[]  3[]      4 Facial Palsy 0[]  1[x]  2[]  3[]      5a Motor Arm - left 0[]  1[x]  2[]  3[]  4[]  UN[]    5b Motor Arm - Right 0[x]  1[]  2[]  3[]  4[]  UN[]    6a Motor Leg - Left 0[]  1[x]  2[]  3[]  4[]  UN[]    6b Motor Leg - Right 0[x]  1[]  2[]  3[]  4[]  UN[]    7 Limb Ataxia 0[x]  1[]  2[]  3[]  UN[]     8 Sensory 0[]  1[x]  2[]  UN[]      9 Best Language 0[x]  1[]  2[]  3[]      10 Dysarthria 0[]  1[]  2[x]  UN[]      11 Extinct. and Inattention 0[x]  1[]  2[]       TOTAL:  6     ROS   Comprehensive ROS not able to be obtained 2/2 unintelligible speech but able to confirm LKW and TNK inclusion/exclusion criteria with appropriate yes/no head nods and her typing answers on her phone  Past History   Past Medical History:  Diagnosis Date  Anxiety    Chronic back pain    Chronic neck pain    Complication of anesthesia    pt states her O2 saturation drops after surgery   GERD (gastroesophageal reflux disease)    Headache    migraines- last one 2 months ago   Hypercholesteremia    Hypothyroidism    Kidney stones    Osteoporosis    Reflex sympathetic dystrophy    Tardive dyskinesia    Thyroid  disease     Past Surgical History:  Procedure Laterality Date   ABDOMINAL HYSTERECTOMY     partial   APPENDECTOMY     BACK SURGERY     plates and screws   BREAST SURGERY Left    fibrocystic tumor   CARPAL TUNNEL RELEASE Right 03/22/2019   Procedure: RIGHT CARPAL TUNNEL RELEASE;  Surgeon: Murrell Drivers, MD;  Location: [facility];   Service: Orthopedics;  Laterality: Right;  NO LOCAL   CHOLECYSTECTOMY     CYST EXCISION Left    foot   EAR CYST EXCISION Right 01/27/2013   Procedure: EXCISION CYST ON SCALP ;  Surgeon: Oneil DELENA Budge, MD;  Location: [facility];  Service: General;  Laterality: Right;   EAR CYST EXCISION Right 01/27/2013   Procedure: CYST REMOVAL right axilla;  Surgeon: Oneil DELENA Budge, MD;  Location: [facility];  Service: General;  Laterality: Right;   KNEE ARTHROSCOPY Left    NECK SURGERY     plate in neck   SALPINGOOPHORECTOMY Right    STRABISMUS SURGERY Bilateral    THYROIDECTOMY N/A 03/14/2014   Procedure: TOTAL THYROIDECTOMY;  Surgeon: Oneil DELENA Budge, MD;  Location: [facility];  Service: General;  Laterality: N/A;    Family History: Family History  Problem Relation Age of Onset   Breast cancer Mother    Parkinson's disease Father    Depression Sister     Social History  reports that she has been smoking cigarettes. She has a 10 pack-year smoking history. She has never used smokeless tobacco. She reports current alcohol use. She reports that she does not use drugs.  Allergies  Allergen Reactions   Darvocet [Propoxyphene N-Acetaminophen ] Hives   Lyrica [Pregabalin] Swelling    Medications  No current facility-administered medications for this encounter.  Current Outpatient Medications:    ALPRAZolam  (XANAX ) 1 MG tablet, Take 1 mg by mouth 4 (four) times daily as needed for anxiety., Disp: , Rfl:    baclofen (LIORESAL) 10 MG tablet, Take 10 mg by mouth 4 (four) times daily as needed for muscle spasms., Disp: , Rfl:    citalopram  (CELEXA ) 40 MG tablet, Take 40 mg by mouth daily., Disp: , Rfl:    levothyroxine  (SYNTHROID ) 75 MCG tablet, Take 1 tablet (75 mcg total) by mouth daily before breakfast., Disp: 7 tablet, Rfl: 0   OVER THE COUNTER MEDICATION, Take 1 tablet by mouth 2 (two) times daily. OTC sinus medication, Disp: , Rfl:    traZODone  (DESYREL ) 50 MG tablet, Take 50-100 mg by mouth at bedtime as  needed for sleep., Disp: , Rfl:    venlafaxine (EFFEXOR) 37.5 MG tablet, Take 37.5 mg by mouth 2 (two) times daily., Disp: , Rfl:   Vitals  There were no vitals filed for this visit.  There is no height or weight on file to calculate BMI.  Physical Exam   Gen: patient lying in bed, tearful CV: extremities appear well-perfused Resp: normal WOB  Neurologic exam MS: alert, oriented x4 based on her texting answers  on her phone, follows commands Speech: no intelligible speech 2/2 severe dysarthria CN: PERRL, blinks to threat bilat, EOMI, sensation intact, L facial droop, hearing intact to voice Motor: drift but not to bed LUE and LLE, no drift RUE and RLE Sensory: sensory impairment to LT on L side Reflexes: UTA 2/2 tele-exam Coordination: FNF intact bilat Gait: deferred  Labs/Imaging/Neurodiagnostic studies   CBC: No results for input(s): WBC, NEUTROABS, HGB, HCT, MCV, PLT in the last 168 hours. Basic Metabolic Panel:  Lab Results  Component Value Date   NA 138 03/22/2022   K 4.3 03/22/2022   CO2 24 03/22/2022   GLUCOSE 107 (H) 03/22/2022   BUN 13 03/22/2022   CREATININE 0.91 03/22/2022   CALCIUM  8.7 (L) 03/22/2022   GFRNONAA >60 03/22/2022   GFRAA 88 (L) 03/15/2014   Lipid Panel: No results found for: LDLCALC HgbA1c: No results found for: HGBA1C Urine Drug Screen:     Component Value Date/Time   LABOPIA NONE DETECTED 09/07/2012 1712   COCAINSCRNUR NONE DETECTED 09/07/2012 1712   LABBENZ POSITIVE (A) 09/07/2012 1712   AMPHETMU NONE DETECTED 09/07/2012 1712   THCU POSITIVE (A) 09/07/2012 1712   LABBARB NONE DETECTED 09/07/2012 1712    Alcohol Level     Component Value Date/Time   ETH <11 09/07/2012 1710   INR No results found for: INR APTT No results found for: APTT AED levels: No results found for: PHENYTOIN, ZONISAMIDE, LAMOTRIGINE, LEVETIRACETA  CT Head without contrast(Personally reviewed): No acute process, ASPECTS 10/10  CT  angio Head and Neck with contrast(Personally reviewed): pending  ASSESSMENT   This is a 53 year old woman with a past medical history of anxiety, chronic back pain and neck pain, hypercholesterolemia, hypothyroidism on Synthroid , tardive dyskinesia, reflex sympathetic dystrophy, remote CVA without residual deficits who presents with acute onset severe dysarthria and mild L sided weakness. LKW 1300. Now s/p TNK  RECOMMENDATIONS   - Admit to 4N neuro ICU at [facility] under Dr. Aisha Seals - Keep SBP less than 180/105 for the 1st 24 hours post TNK to reduce the risk of hemorrhagic transformation - Neurochecks and NIHSS documentation per post-TNK protocol - STAT head CT for any change in neurologic exam - CTA prior to transfer, call neuro if abnormal. Exam not c/w LVO therefore CTA was not performed as part of the stroke code - MRI brain wo contrast to be performed 24 hrs after administration of TNK to both evaluate for stroke and r/o hemorrhagic conversion - No antiplatelets or anticoagulation including pharmacologic DVT prophylaxis until brain imaging 24 hrs post-TNK confirms no ICH - Check LDL and add statin per guidelines for goal LDL <70 - TTE with bubble - SCDs for DVT prophylaxis - NPO until bedside dysphagia screen passed and documented - PT/OT/SLP - stroke education - outpatient f/u with neurology after discharge - Neurology will continue to follow. Please page [facility] when patient arrives at [facility]  Addendum 2005:  CTA results  1. No large vessel occlusion or hemodynamically significant stenosis in the head or neck. 2. Curvilinear defect in the right ICA, possibly representing a focal web or contained dissection. No dissection in the more distal right ICA. 3. Minimal atherosclerotic calcifications within the cavernous internal carotid arteries bilaterally.  Recommend MRA neck added to MRI brain above to R ICA findings  This patient is critically ill and at  significant risk of neurological worsening, death and care requires constant monitoring of vital signs, hemodynamics,respiratory and cardiac monitoring, neurological assessment, discussion with family, other  specialists and medical decision making of high complexity. I spent 70 minutes of neurocritical care time  in the care of  this patient. This was time spent independent of any time provided by nurse practitioner or PA.  Elida Ross, MD [facility] [phone]  If 7pm- 7am, please page neurology on call as listed in AMION.

## 2023-12-28 NOTE — ED Notes (Signed)
 EDP DR. Scheving in to see pt in room 10. Neuro MD speaking with pt's son via monitor re TNK.

## 2023-12-28 NOTE — ED Notes (Signed)
 Pt moved from CT table to ED stretcher in CT room for neuro exam via monitor.

## 2023-12-28 NOTE — ED Notes (Addendum)
 In CT, neuro tele initiated/present, pending neuro MD, EMS present. Arrived with c/o slurred speech. Now c/o L sided HA, behind L ear and temple. CT in progress.

## 2023-12-28 NOTE — ED Notes (Signed)
 To CT on EMS stretcher with tele neuro monitor.

## 2023-12-28 NOTE — ED Provider Triage Note (Signed)
 Emergency Medicine Provider Triage Evaluation Note  [name] , a 53 y.o. female  was evaluated in triage.  Pt complains of acute onset slurred speech noticed by her son.  Last known normal was 2 PM.  Vital signs normal with EMS.  No other focal neurodeficits noted other than slurred speech which is very garbled.  Has a history of HLD, thyroid  disease, tardive dyskinesia.  No blood thinners  Review of Systems  Positive: Slurred speech Negative: Falls head trauma asymmetric weakness  Physical Exam  There were no vitals taken for this visit. Gen:   Awake, no distress   Resp:  Normal effort  Other:  4/5 strength BL UEs, 3/5 strength BL LEs, symmetric face, edentulous, follows all commands, severe dysarthria and aphasia  Medical Decision Making  Medically screening exam initiated at 2:57 PM.  Appropriate orders placed.  Travis L Flynn was informed that the remainder of the evaluation will be completed by another provider, this initial triage assessment does not replace that evaluation, and the importance of remaining in the ED until their evaluation is complete.  Code stroke called on patient from EMS.  Patient evaluated by provider in triage and sent immediately to the CT scanner.  Teleneurology evaluating.   Franklyn Sid SAILOR, MD 12/28/23 [phone]

## 2023-12-28 NOTE — ED Notes (Signed)
 Pt given sandwich and soda per pt request, pt passed swallow eval with no difficulties.

## 2023-12-28 NOTE — ED Notes (Signed)
 Back to room 10. Family at [facility].

## 2023-12-28 NOTE — ED Notes (Addendum)
 Delay in CT d/t other pt on table.

## 2023-12-28 NOTE — ED Notes (Addendum)
 Neuro MD on monitor, pt on CT table, dry CT complete.

## 2023-12-28 NOTE — ED Notes (Signed)
CTA finished.

## 2023-12-28 NOTE — H&P (Signed)
 NEUROLOGY H&P NOTE   Date of service: December 28, 2023 Patient Name: [name] MRN:  [id number] DOB:  1971/01/10 Chief Complaint: severe dysarthria and mild L sided weakness. LKW 1300. Now s/p TNK   History of Present Illness  [name] is a 53 y.o. female with hx of cocaine use,HLD, hypothyroidism, osteoporosis, current everyday smoker who presents with significant slurred speech and some left-sided weakness.  Her last seen normal was 1300, patient was on phone with her son at 1400 who noted that his speech was significantly slurred and almost to the point where it was incomprehensible.  She was brought into the ED at [facility] where she was evaluated by teleneurology.  CT head without contrast was negative for any acute intracranial abnormalities.  She was offered and consented TNK.  She was given TNK at 1538.  Patient was transferred to [facility] neuro ICU for close monitoring and for further stroke workup.  Patient endorses a prior history of stroke but reports no residual deficits.  Patient denies any history of diabetes or hypertension.  She denies any known history of atrial fibrillation.  Last known well: 1300 on 12/28/2023 Modified rankin score: 0-Completely asymptomatic and back to baseline post- stroke IV Thrombolysis: Yes.  Given at [facility]. Thrombectomy: Not offered, no LVO.   NIHSS components Score: Comment  1a Level of Conscious 0[]  1[]  2[]  3[]      1b LOC Questions 0[]  1[]  2[]       1c LOC Commands 0[]  1[]  2[]       2 Best Gaze 0[]  1[]  2[]       3 Visual 0[]  1[]  2[]  3[]      4 Facial Palsy 0[]  1[]  2[]  3[]      5a Motor Arm - left 0[]  1[]  2[]  3[]  4[]  UN[]    5b Motor Arm - Right 0[]  1[]  2[]  3[]  4[]  UN[]    6a Motor Leg - Left 0[]  1[]  2[]  3[]  4[]  UN[]    6b Motor Leg - Right 0[]  1[]  2[]  3[]  4[]  UN[]    7 Limb Ataxia 0[]  1[]  2[]  UN[]      8 Sensory 0[]  1[]  2[]  UN[]      9 Best Language 0[]  1[]  2[]  3[]      10 Dysarthria 0[]  1[]  2[x]   UN[]      11 Extinct. and Inattention 0[]  1[]  2[]       TOTAL: 0      ROS  Comprehensive ROS performed and pertinent positives documented in the HPI  Past History   Past Medical History:  Diagnosis Date   Anxiety    Chronic back pain    Chronic neck pain    Complication of anesthesia    pt states her O2 saturation drops after surgery   GERD (gastroesophageal reflux disease)    Headache    migraines- last one 2 months ago   Hypercholesteremia    Hypothyroidism    Kidney stones    Osteoporosis    Reflex sympathetic dystrophy    Tardive dyskinesia    Thyroid  disease    Past Surgical History:  Procedure Laterality Date   ABDOMINAL HYSTERECTOMY     partial   APPENDECTOMY     BACK SURGERY     plates and screws   BREAST SURGERY Left    fibrocystic tumor   CARPAL TUNNEL RELEASE Right 03/22/2019   Procedure: RIGHT CARPAL TUNNEL RELEASE;  Surgeon: Murrell Drivers, MD;  Location: [facility];  Service: Orthopedics;  Laterality: Right;  NO LOCAL  CHOLECYSTECTOMY     CYST EXCISION Left    foot   EAR CYST EXCISION Right 01/27/2013   Procedure: EXCISION CYST ON SCALP ;  Surgeon: Oneil DELENA Budge, MD;  Location: [facility];  Service: General;  Laterality: Right;   EAR CYST EXCISION Right 01/27/2013   Procedure: CYST REMOVAL right axilla;  Surgeon: Oneil DELENA Budge, MD;  Location: [facility];  Service: General;  Laterality: Right;   KNEE ARTHROSCOPY Left    NECK SURGERY     plate in neck   SALPINGOOPHORECTOMY Right    STRABISMUS SURGERY Bilateral    THYROIDECTOMY N/A 03/14/2014   Procedure: TOTAL THYROIDECTOMY;  Surgeon: Oneil DELENA Budge, MD;  Location: [facility];  Service: General;  Laterality: N/A;   Family History  Problem Relation Age of Onset   Breast cancer Mother    Parkinson's disease Father    Depression Sister    Social History   Socioeconomic History   Marital status: Widowed    Spouse name: Not on file   Number of children: Not on file   Years of education: Not  on file   Highest education level: Not on file  Occupational History   Not on file  Tobacco Use   Smoking status: Every Day    Current packs/day: 0.50    Average packs/day: 0.5 packs/day for 20.0 years (10.0 ttl pk-yrs)    Types: Cigarettes   Smokeless tobacco: Never  Substance and Sexual Activity   Alcohol use: Yes    Comment: occasionally   Drug use: No    Comment: prescription Rx   Sexual activity: Never  Other Topics Concern   Not on file  Social History Narrative   Lives alone.   Social Drivers of Corporate investment banker Strain: Not on file  Food Insecurity: Not on file  Transportation Needs: Not on file  Physical Activity: Not on file  Stress: Not on file  Social Connections: Unknown (03/30/2022)   Received from [facility]   Social Network    Social Network: Not on file   Allergies  Allergen Reactions   Darvocet [Propoxyphene N-Acetaminophen ] Hives   Lyrica [Pregabalin] Swelling    Medications  No current facility-administered medications for this encounter.   Vitals   Vitals:   12/28/23 1932 12/28/23 1959 12/28/23 2000 12/28/23 2100  BP: (!) 107/93 (!) 107/93 (!) 140/68 137/75  Pulse: 68 65  (!) 57  Resp: 18 15  16   Temp: 98.6 F (37 C) 98.6 F (37 C)    TempSrc: Oral     SpO2: 95% 97%  96%  Weight:         Body mass index is 23.66 kg/m.  Physical Exam   General: Laying comfortably in bed; in no acute distress.  HENT: Normal oropharynx and mucosa. Normal external appearance of ears and nose.  Neck: Supple, no pain or tenderness  CV: No JVD. No peripheral edema.  Pulmonary: Symmetric Chest rise. Normal respiratory effort.  Abdomen: Soft to touch, non-tender.  Ext: No cyanosis, edema, or deformity  Skin: No rash. Normal palpation of skin.   Musculoskeletal: Normal digits and nails by inspection. No clubbing.   Neurologic Examination  Mental status/Cognition: Alert, oriented to self, place, month and year, good attention.   Speech/language: Severe dysarthria and hard to comprehend language, fluent, comprehension intact, object naming intact, repetition intact.  Cranial nerves:   CN II Pupils equal and reactive to light, no VF deficits    CN III,IV,VI EOM intact,  no gaze preference or deviation, no nystagmus    CN V normal sensation in V1, V2, and V3 segments bilaterally    CN VII no asymmetry, no nasolabial fold flattening    CN VIII normal hearing to speech    CN IX & X normal palatal elevation, no uvular deviation    CN XI 5/5 head turn and 5/5 shoulder shrug bilaterally    CN XII midline tongue protrusion    Motor:  Muscle bulk: Normal, tone normal, pronator drift none.  Tremor none. Mvmt Root Nerve  Muscle Right Left Comments  SA C5/6 Ax Deltoid 5 5   EF C5/6 Mc Biceps 5 5   EE C6/7/8 Rad Triceps 5 5   WF C6/7 Med FCR     WE C7/8 PIN ECU     F Ab C8/T1 U ADM/FDI 5 5   HF L1/2/3 Fem Illopsoas 5 5   KE L2/3/4 Fem Quad 5 5   DF L4/5 D Peron Tib Ant 5 5   PF S1/2 Tibial Grc/Sol 5 5    Sensation:  Light touch Intact throughout   Pin prick    Temperature    Vibration   Proprioception    Coordination/Complex Motor:  - Finger to Nose intact bilaterally - Heel to shin intact bilaterally - Rapid alternating movement normal - Gait: Deferred at this time. Labs   CBC:  Recent Labs  Lab 12/28/23 1458 12/28/23 1530  WBC 6.2  --   NEUTROABS  --  3.4  HGB 13.7  --   HCT 40.0  --   MCV 94.8  --   PLT 197  --    Basic Metabolic Panel:  Lab Results  Component Value Date   NA 136 12/28/2023   K 3.0 (L) 12/28/2023   CO2 26 12/28/2023   GLUCOSE 83 12/28/2023   BUN <5 (L) 12/28/2023   CREATININE 0.93 12/28/2023   CALCIUM  8.3 (L) 12/28/2023   GFRNONAA >60 12/28/2023   GFRAA 88 (L) 03/15/2014   Lipid Panel: No results found for: LDLCALC HgbA1c: No results found for: HGBA1C Urine Drug Screen:     Component Value Date/Time   LABOPIA NONE DETECTED 12/28/2023 1458   COCAINSCRNUR  POSITIVE (A) 12/28/2023 1458   LABBENZ POSITIVE (A) 12/28/2023 1458   AMPHETMU NONE DETECTED 12/28/2023 1458   THCU NONE DETECTED 12/28/2023 1458   LABBARB NONE DETECTED 12/28/2023 1458    Alcohol Level     Component Value Date/Time   ETH <11 09/07/2012 1710   INR  Lab Results  Component Value Date   INR 0.9 12/28/2023   APTT  Lab Results  Component Value Date   APTT 25 12/28/2023     CT Head without contrast(Personally reviewed): CTH was negative for a large hypodensity concerning for a large territory infarct or hyperdensity concerning for an ICH  CT angio Head and Neck with contrast(Personally reviewed): No LVO.  MRI Brain(Personally reviewed): Pending  Assessment   [name] is a 53 y.o. female with hx of cocaine use,HLD, hypothyroidism, osteoporosis, current everyday smoker who presented to [facility] ED with severe dysarthria and left-sided weakness.  She was given TNKase and transferred to [facility] for post [facility] checks.  Left-sided weakness is resolved, still has severe dysarthria but reports that this is much better.  I do have a concern that this could all be secondary to functional neurological symptom disorder as her dysarthria tends to improve when she is distracted.  I do  think that she has significant stroke risk factors including cocaine use, daily smoker, hyperlipidemia.  Primary Diagnosis:  Strokelike episode.  Recommendations  Strokelike episode with dysarthria and left-sided weakness: - Frequent NeuroChecks for post tNK care per stroke unit protocol: - Initial CTH demonstrated no acute hemorrhage or mass - MRI Brain -pending and ordered for 24 hours post tnkase. - CTA -no LVO - TTE -pending - Lipid Panel: LDL -pending  - Statin: If LDL is greater than 70. - HbA1c: Pending - Antithrombotic: Start ASA 81 mg daily if 24 h CTH/MRI does not show acute hemorrhage - DVT prophylaxis: SCDs. Pharmacologic prophylaxis if 24 h CTH  does not demonstrate acute hemorrhage - Smoking cessation: Counseled her on the importance of quitting smoking to reduce risk of stroke in the future. - Systolic Blood Pressure goal: < 180 mm Hg - Telemetry monitoring for arrhythmia: 72 hours - Swallow screen - ordered - PT/OT/SLP consults - Counseled her on the portance of quitting cocaine use to reduce risk of stroke in the future.  ______________________________________________________________________  CODE STATUS was verified and patient is full code.  Allergies verified and patient is allergic to Darvocet and Lyrica.  Signed, Hammond Obeirne, MD [facility]

## 2023-12-29 ENCOUNTER — Inpatient Hospital Stay (HOSPITAL_COMMUNITY)

## 2023-12-29 DIAGNOSIS — R29701 NIHSS score 1: Secondary | ICD-10-CM | POA: Diagnosis not present

## 2023-12-29 DIAGNOSIS — I63432 Cerebral infarction due to embolism of left posterior cerebral artery: Secondary | ICD-10-CM

## 2023-12-29 DIAGNOSIS — F1410 Cocaine abuse, uncomplicated: Secondary | ICD-10-CM

## 2023-12-29 DIAGNOSIS — I7090 Unspecified atherosclerosis: Secondary | ICD-10-CM

## 2023-12-29 DIAGNOSIS — I6389 Other cerebral infarction: Secondary | ICD-10-CM

## 2023-12-29 DIAGNOSIS — F17210 Nicotine dependence, cigarettes, uncomplicated: Secondary | ICD-10-CM

## 2023-12-29 DIAGNOSIS — E785 Hyperlipidemia, unspecified: Secondary | ICD-10-CM

## 2023-12-29 LAB — ECHOCARDIOGRAM COMPLETE
AR max vel: 2.57 cm2
AV Area VTI: 2.43 cm2
AV Area mean vel: 2.25 cm2
AV Mean grad: 3 mmHg
AV Peak grad: 6.9 mmHg
Ao pk vel: 1.31 m/s
Area-P 1/2: 2.82 cm2
Calc EF: 58.6 %
Height: 69.5 in
MV M vel: 2.23 m/s
MV Peak grad: 19.9 mmHg
MV VTI: 2.16 cm2
S' Lateral: 3.4 cm
Single Plane A2C EF: 61.6 %
Single Plane A4C EF: 56.7 %
Weight: 2331.58 [oz_av]

## 2023-12-29 LAB — LIPID PANEL
Cholesterol: 167 mg/dL (ref 0–200)
HDL: 36 mg/dL — ABNORMAL LOW (ref >40)
LDL Cholesterol: 120 mg/dL — ABNORMAL HIGH (ref 0–99)
Total CHOL/HDL Ratio: 4.6 ratio
Triglycerides: 55 mg/dL (ref <150)
VLDL: 11 mg/dL (ref 0–40)

## 2023-12-29 MED ORDER — PANTOPRAZOLE SODIUM 40 MG PO TBEC
40.0000 mg | DELAYED_RELEASE_TABLET | Freq: Every day | ORAL | Status: DC
  Administered 2023-12-29 – 2023-12-30 (×2): 40 mg via ORAL
  Filled 2023-12-29 (×2): qty 1

## 2023-12-29 MED ORDER — CLOPIDOGREL BISULFATE 75 MG PO TABS
75.0000 mg | ORAL_TABLET | Freq: Every day | ORAL | Status: DC
  Administered 2023-12-29 – 2023-12-31 (×3): 75 mg via ORAL
  Filled 2023-12-29 (×3): qty 1

## 2023-12-29 MED ORDER — GADOBUTROL 1 MMOL/ML IV SOLN
7.0000 mL | Freq: Once | INTRAVENOUS | Status: AC | PRN
  Administered 2023-12-29: 7 mL via INTRAVENOUS

## 2023-12-29 MED ORDER — CHLORHEXIDINE GLUCONATE CLOTH 2 % EX PADS
6.0000 | MEDICATED_PAD | Freq: Every day | CUTANEOUS | Status: DC
  Administered 2023-12-29 – 2023-12-31 (×2): 6 via TOPICAL

## 2023-12-29 MED ORDER — ASPIRIN 81 MG PO TBEC
81.0000 mg | DELAYED_RELEASE_TABLET | Freq: Every day | ORAL | Status: DC
  Administered 2023-12-29 – 2023-12-31 (×3): 81 mg via ORAL
  Filled 2023-12-29 (×3): qty 1

## 2023-12-29 MED ORDER — ROSUVASTATIN CALCIUM 20 MG PO TABS: 40.0000 mg | ORAL_TABLET | Freq: Every day | ORAL | Status: DC

## 2023-12-29 MED ORDER — ROSUVASTATIN CALCIUM 20 MG PO TABS
20.0000 mg | ORAL_TABLET | Freq: Every day | ORAL | Status: DC
Start: 2023-12-30 — End: 2023-12-31
  Administered 2023-12-30 – 2023-12-31 (×2): 20 mg via ORAL
  Filled 2023-12-29 (×2): qty 1

## 2023-12-29 MED ORDER — ENOXAPARIN SODIUM 30 MG/0.3ML IJ SOSY
30.0000 mg | PREFILLED_SYRINGE | Freq: Every evening | INTRAMUSCULAR | Status: DC
  Administered 2023-12-29 – 2023-12-30 (×2): 30 mg via SUBCUTANEOUS
  Filled 2023-12-29 (×2): qty 0.3

## 2023-12-29 NOTE — Discharge Instructions (Signed)
 Dear [name],   Congratulations for your interest in quitting smoking!  Find a program that suits you best: when you want to quit, how you need support, where you live, and how you like to learn.    If you're ready to get started TODAY, consider scheduling a visit through [email or web address].  Appointments are available from 8am to 8pm, Monday to Friday.   Most health insurance plans will cover some level of tobacco cessation visits and medications.    Additional Resources: [employer] are also available to help you quit & provide the support you'll need. Many programs are available in both [place] and Spanish and have a long history of successfully helping people get off and stay off tobacco.    Quit Smoking Apps:  quitSTART at [employer]iousBroker.de QuitGuide?at ForgetParking.dk Online education and resources: Smokefree  at [employer].gov Free Telephone Coaching: QuitNow,  Call 1-800-QUIT-NOW (984-469-7366) or Text- Ready to 828-577-5653 *Quit[facility] has teamed up with Medicaid to offer a free 14 week program    Vaping- Want to Quit? Free 24/7 support. Call [facility]  [place], [place], [place], [place], [place]  [facility]

## 2023-12-29 NOTE — Progress Notes (Addendum)
 STROKE TEAM PROGRESS NOTE   INTERIM HISTORY/SUBJECTIVE Patient reports she is back to baseline strength, but still endorsing some mild dysarthria. Patient does not have her dentures.  Son is at bedside, they have no other concerns at this time.  Patient with some low blood pressure readings throughout the night, however nursing endorses issues with blood pressure cuff overnight.  We discussed importance of controlling modifiable stroke risk factors such as cocaine abuse and smoking.  Patient voiced understanding.  OBJECTIVE  CBC    Component Value Date/Time   WBC 6.2 12/28/2023 1458   RBC 4.22 12/28/2023 1458   HGB 13.7 12/28/2023 1458   HCT 40.0 12/28/2023 1458   PLT 197 12/28/2023 1458   MCV 94.8 12/28/2023 1458   MCH 32.5 12/28/2023 1458   MCHC 34.3 12/28/2023 1458   RDW 13.1 12/28/2023 1458   LYMPHSABS 2.2 12/28/2023 1530   MONOABS 0.5 12/28/2023 1530   EOSABS 0.2 12/28/2023 1530   BASOSABS 0.0 12/28/2023 1530    BMET    Component Value Date/Time   NA 136 12/28/2023 1458   K 3.0 (L) 12/28/2023 1458   CL 104 12/28/2023 1458   CO2 26 12/28/2023 1458   GLUCOSE 83 12/28/2023 1458   BUN <5 (L) 12/28/2023 1458   CREATININE 0.93 12/28/2023 1458   CALCIUM  8.3 (L) 12/28/2023 1458   GFRNONAA >60 12/28/2023 1458    IMAGING past 24 hours CT ANGIO HEAD NECK W WO CM Result Date: 12/28/2023 EXAM: CTA HEAD AND NECK WITH AND WITHOUT 12/28/2023 04:54:56 PM TECHNIQUE: CTA of the head and neck was performed with and without the administration of intravenous contrast. 75 mL of iohexol (OMNIPAQUE) 350 MG/ML injection was administered. Multiplanar 2D and/or 3D reformatted images are provided for review. Automated exposure control, iterative reconstruction, and/or weight based adjustment of the mA/kV was utilized to reduce the radiation dose to as low as reasonably achievable. Stenosis of the internal carotid arteries measured using NASCET criteria. COMPARISON: None available CLINICAL HISTORY:  Acute neuro deficit with stroke suspected, presenting with disabling dysarthria and mild left-sided weakness. The patient is a 53 year old woman with a history of anxiety, chronic back and neck pain, hypercholesterolemia, hypothyroidism, tardive dyskinesia, reflex sympathetic dystrophy, and remote CVA without residual deficits. A prior head CT showed no acute intracranial process, no blood, and ASPECTS 10/10. FINDINGS: CTA NECK: AORTIC ARCH AND ARCH VESSELS: No dissection or arterial injury. No significant stenosis of the brachiocephalic or subclavian arteries. CERVICAL CAROTID ARTERIES: A curvilinear defect is present at the right ICA; this may represent a focal web or contained dissection. There is no dissection in the more distal right ICA. The left ICA is patent without significant stenosis or dissection. No hemodynamically significant stenosis by NASCET criteria. CERVICAL VERTEBRAL ARTERIES: No dissection, arterial injury, or significant stenosis. LUNGS AND MEDIASTINUM: Unremarkable. SOFT TISSUES: Thyroidectomy. BONES: Cervical spine fusion is noted at C5-C6 and C6-C7. CTA HEAD: ANTERIOR CIRCULATION: Minimal atherosclerotic calcifications are present within the cavernous internal carotid arteries bilaterally. No significant stenosis of the anterior cerebral arteries. No significant stenosis of the middle cerebral arteries. No aneurysm. POSTERIOR CIRCULATION: No significant stenosis of the posterior cerebral arteries. No significant stenosis of the basilar artery. No significant stenosis of the vertebral arteries. No aneurysm. OTHER: No dural venous sinus thrombosis on this non-dedicated study. IMPRESSION: 1. No large vessel occlusion or hemodynamically significant stenosis in the head or neck. 2. Curvilinear defect in the right ICA, possibly representing a focal web or contained dissection. No dissection in the  more distal right ICA. 3. Minimal atherosclerotic calcifications within the cavernous internal  carotid arteries bilaterally. Findings were called to Dr. Matthews at 5:30 pm Electronically signed by: Lonni Necessary MD 12/28/2023 05:38 PM EDT RP Workstation: [id number]   CT HEAD CODE STROKE WO CONTRAST Result Date: 12/28/2023 EXAM: CT HEAD WITHOUT CONTRAST 12/28/2023 03:07:44 PM TECHNIQUE: CT of the head was performed without the administration of intravenous contrast. Automated exposure control, iterative reconstruction, and/or weight based adjustment of the mA/kV was utilized to reduce the radiation dose to as low as reasonably achievable. COMPARISON: CT head without contrast 05/05/2013. CLINICAL HISTORY: Neuro deficit, acute, stroke suspected. Slurred speech. Headache on left side. LKW 1400. FINDINGS: BRAIN AND VENTRICLES: No acute hemorrhage. No evidence of acute infarct. No hydrocephalus. No extra-axial collection. No mass effect or midline shift. ORBITS: No acute abnormality. SINUSES: No acute abnormality. SOFT TISSUES AND SKULL: No acute soft tissue abnormality. No skull fracture. [place] stroke program early CT (aspect) score Ganglionic (caudate, ic, Lentiform Nucleus, insula, M1-m3): 7 Supraganglionic (m4-m6): 3 Total: 10 IMPRESSION: 1. No acute intracranial abnormality.  ASPECTS 10/10 The pertinent results were texted to Dr. Matthews via the [facility] system at 3:14 pm. Electronically signed by: Lonni Necessary MD 12/28/2023 03:18 PM EDT RP Workstation: [id number]    Vitals:   12/29/23 0600 12/29/23 0700 12/29/23 0800 12/29/23 0900  BP: 111/62 108/63 110/64 (!) 139/103  Pulse: 60 60 61 (!) 58  Resp: (!) 22 16 (!) 21 15  Temp:   98 F (36.7 C)   TempSrc:   Oral   SpO2: 94% 95% 99% 100%  Weight:      Height:        PHYSICAL EXAM General:  Alert, well-nourished, well-developed patient in no acute distress Psych:  Mood and affect appropriate for situation CV: Regular rate and rhythm on monitor Respiratory:  Regular, unlabored respirations on room air  NEURO:  Mental Status:  AA&Ox3, patient is able to give clear and coherent history Speech/Language: speech is with very mild dysarthria but without aphasia.  Naming, repetition, fluency, and comprehension intact.  Cranial Nerves:  II: PERRL. Visual fields full.  III, IV, VI: EOMI. Eyelids elevate symmetrically.  V: Sensation is intact to light touch and symmetrical to face.  VII: Face is symmetrical resting and smiling VIII: hearing intact to voice. IX, X: Palate elevates symmetrically. Phonation is normal.  KP:Dynloizm shrug 5/5. XII: tongue is midline without fasciculations. Motor: 5/5 strength to all muscle groups tested.  Tone: is normal and bulk is normal Sensation- Intact to light touch bilaterally. Extinction absent to light touch to DSS.   Coordination: FTN intact bilaterally, HKS: no ataxia in BLE.No drift.  Gait- deferred  Most Recent NIH 1   ASSESSMENT/PLAN  [name] is a 53 y.o. female with history of hyperlipidemia, cocaine use, hypothyroidism, osteoporosis, daily smoking, and tardive dyskinesia admitted for severe dysarthria and left-sided weakness.  Now status post TNK, left-sided weakness has completely resolved and still with some mild dysarthria.  NIH on Admission 6  Stroke: left MCA small cortical infarcts s/p TNK, etiology atherosclerosis due to uncontrolled risk factors such as smoker and cocaine use vs. Cardioembolic source given cortical location  Code Stroke, CT head with no acute abnormality. ASPECTS 10.    CTA head & neck no LVO, curvilinear defect in right ICA, minimal atherosclerotic calcifications within cavernous internal carotid arteries bilaterally MRI pending 2D Echo EF 60-65% LE venous doppler pending TCD bubble study pending Will consider TEE LDL 120  HgbA1c 5.0 UDS + cocaine and benzo VTE prophylaxis -SCDs No antithrombotic prior to admission, now on aspirin 81 mg and plavix daily for 3 weeks and then ASA alone. Therapy recommendations: none  Disposition:  Pending   Hx of Stroke/TIA Son reports 03/2022 admitted OSH for left facial droop and left sided weakness with confusion that lasted a week but apparently MRI negative.  Patient reports her PCP referred to this as a mini stroke.  Hyperlipidemia Home meds: None LDL 120, goal < 70 Add rosuvastatin 20 mg daily Continue statin at discharge  Tobacco Abuse Patient smokes 1/2 pack per day for 20 years      Ready to quit? No Nicotine  replacement therapy provided  Cocaine Abuse Patient uses cocaine UDS positive for cocaine       Ready to quit? No TOC consult for cessation placed  Other Stroke Risk Factors None  Other Active Problems Hypothyroidism-continue home Synthroid  MDD -continue home Effexor and Celexa  GAD -continue home Xanax  Tardive dyskinesia - followed with Dr. Evonnie at [facility] Hx of pseudoseizure  Hospital day # 1    ATTENDING NOTE: I reviewed above note and agree with the assessment and plan. Pt was seen and examined.   Son is at the bedside. Pt is awake, alert, eyes open, orientated to age, place, time and people. No aphasia, mild to moderate dysarthria, however without denture, fluent language, following all simple commands. Able to name and repeat in dysarthric voice. No gaze palsy, tracking bilaterally, visual field full, PERRL. No facial droop. Tongue midline. Bilateral UEs 4/5, no drift. Bilaterally LEs 4/5, no drift. Sensation symmetrical bilaterally, b/l FTN intact, gait not tested.   For detailed assessment and plan, please refer to above as I have made changes wherever appropriate.   Ary Cummins, MD PhD Stroke Neurology 12/29/2023 4:33 PM  This patient is critically ill due to stroke s/p TNK, cocaine use and at significant risk of neurological worsening, death form recurrent stroke, hemorrhagic transformation, bleeding from TNK. This patient's care requires constant monitoring of vital signs, hemodynamics, respiratory and cardiac monitoring, review of multiple  databases, neurological assessment, discussion with family, other specialists and medical decision making of high complexity. I spent 40 minutes of neurocritical care time in the care of this patient. I had long discussion with son at bedside, updated pt current condition, treatment plan and potential prognosis, and answered all the questions.  He expressed understanding and appreciation.      To contact Stroke Continuity provider, please refer to [email or web address]. After hours, contact General Neurology

## 2023-12-29 NOTE — Evaluation (Signed)
 Occupational Therapy Evaluation Patient Details Name: [name] MRN: [id number] DOB: 06/16/1970 Today's Date: 12/29/2023   History of Present Illness   53 yo F adm 9/28 dysarthria and mild L sided weakness.  Sp TNK, CT Head:No acute intracranial abnormality.  PMH includes: Anxiety, Chronic back pain, GERD, Hypercholesteremia, Hypothyroidism, Reflex sympathetic dystrophy, Tardive dyskinesia.     Clinical Impressions Patient admitted for the diagnosis above.  Patient lives alone, but family will call to check in on her.  Patient does not drive, but is independent with all ADL,iADL and mobility.  Currently she endorses mild dysarthria, but is essentially at her baseline for in room mobility/toileting, and ADL completion.  No acute OT needs identified, OT to sign off with no post acute recommendations.       If plan is discharge home, recommend the following:   Assist for transportation     Functional Status Assessment   Patient has not had a recent decline in their functional status     Equipment Recommendations   None recommended by OT     Recommendations for Other Services         Precautions/Restrictions   Precautions Precautions: None Recall of Precautions/Restrictions: Intact Precaution/Restrictions Comments: SBP<180 Restrictions Weight Bearing Restrictions Per Provider Order: No     Mobility Bed Mobility Overal bed mobility: Independent                  Transfers Overall transfer level: Modified independent Equipment used: None                      Balance Overall balance assessment: No apparent balance deficits (not formally assessed)                                         ADL either performed or assessed with clinical judgement   ADL Overall ADL's : At baseline                                             Vision Patient Visual Report: No change from baseline       Perception  Perception: Within Functional Limits       Praxis Praxis: WFL       Pertinent Vitals/Pain Pain Assessment Pain Assessment: No/denies pain     Extremity/Trunk Assessment Upper Extremity Assessment Upper Extremity Assessment: Overall WFL for tasks assessed   Lower Extremity Assessment Lower Extremity Assessment: Defer to PT evaluation   Cervical / Trunk Assessment Cervical / Trunk Assessment: Normal   Communication Communication Communication: No apparent difficulties   Cognition Arousal: Alert Behavior During Therapy: WFL for tasks assessed/performed Cognition: No apparent impairments                               Following commands: Intact       Cueing  General Comments   Cueing Techniques: Verbal cues   VSS on RA   Exercises     Shoulder Instructions      Home Living Family/patient expects to be discharged to:: Private residence Living Arrangements: Alone Available Help at Discharge: Family;Available PRN/intermittently Type of Home: Mobile home Home Access: Stairs to enter Entrance Stairs-Number of Steps: 3   Home Layout:  One level     Bathroom Shower/Tub: Chief Strategy Officer: Standard     Home Equipment: Cane - quad          Prior Functioning/Environment Prior Level of Function : Independent/Modified Independent               ADLs Comments: Patient does not drive, family assists with community mobility and groceries.    OT Problem List: Other (comment)   OT Treatment/Interventions:        OT Goals(Current goals can be found in the care plan section)   Acute Rehab OT Goals Patient Stated Goal: Return home OT Goal Formulation: With patient Time For Goal Achievement: 01/02/24 Potential to Achieve Goals: Good   OT Frequency:       Co-evaluation              AM-PAC OT 6 Clicks Daily Activity     Outcome Measure Help from another person eating meals?: None Help from another person taking  care of personal grooming?: None Help from another person toileting, which includes using toliet, bedpan, or urinal?: None Help from another person bathing (including washing, rinsing, drying)?: None Help from another person to put on and taking off regular upper body clothing?: None Help from another person to put on and taking off regular lower body clothing?: None 6 Click Score: 24   End of Session Nurse Communication: Mobility status  Activity Tolerance: Patient tolerated treatment well Patient left: in chair;with call bell/phone within reach;with family/visitor present  OT Visit Diagnosis: Cognitive communication deficit (R41.841) Symptoms and signs involving cognitive functions: Cerebral infarction                Time: 8945-8886 OT Time Calculation (min): 19 min Charges:  OT General Charges $OT Visit: 1 Visit OT Evaluation $OT Eval Moderate Complexity: 1 Mod  12/29/2023  RP, OTR/L  Acute Rehabilitation Services  Office:  [phone]   Charlie JONETTA Halsted 12/29/2023, 11:23 AM

## 2023-12-29 NOTE — Evaluation (Signed)
 Physical Therapy Evaluation and d/c  Patient Details Name: [name] MRN: [id number] DOB: 10-15-1970 Today's Date: 12/29/2023  History of Present Illness  53 yo F adm 9/28 dysarthria and mild L sided weakness.  Sp TNK, CT Head:No acute intracranial abnormality.  awaiting mri. PMH includes: Anxiety, Chronic back pain, GERD, Hypercholesteremia, Hypothyroidism, Reflex sympathetic dystrophy, Tardive dyskinesia.  Clinical Impression   Pt presents with [facility] strength, mobility, balance, and activity tolerance. Pt ambulated good hallway distance wtihout AD, overall is mod I for slowed gait but no physical assist needed throughout mobility. Pt denies sensation, vision, or strength changes. PT anticipates pt is at baseline, no follow up acute or post-acute rehab indicated at this time.          If plan is discharge home, recommend the following:     Can travel by private vehicle        Equipment Recommendations None recommended by PT  Recommendations for Other Services       Functional Status Assessment Patient has not had a recent decline in their functional status     Precautions / Restrictions Precautions Precautions: None Recall of Precautions/Restrictions: Intact Precaution/Restrictions Comments: SBP<180 Restrictions Weight Bearing Restrictions Per Provider Order: No      Mobility  Bed Mobility Overal bed mobility: Modified Independent                  Transfers Overall transfer level: Modified independent                      Ambulation/Gait Ambulation/Gait assistance: Modified independent (Device/Increase time) Gait Distance (Feet): 200 Feet Assistive device: None Gait Pattern/deviations: Step-through pattern, WFL(Within Functional Limits)       General Gait Details: slowed, but WFL parameters and no balance impairment noted via screen  Stairs Stairs: Yes Stairs assistance: Modified independent (Device/Increase time) Stair Management: One  rail Right, Step to pattern, Forwards Number of Stairs: 3    Wheelchair Mobility     Tilt Bed    Modified Rankin (Stroke Patients Only) Modified Rankin (Stroke Patients Only) Pre-Morbid Rankin Score: No symptoms Modified Rankin: No significant disability     Balance Overall balance assessment: No apparent balance deficits (not formally assessed) (WFL vertical and horizontal head turns, direction changes, stair navigation)                                           Pertinent Vitals/Pain Pain Assessment Pain Assessment: No/denies pain    Home Living Family/patient expects to be discharged to:: Private residence Living Arrangements: Alone Available Help at Discharge: Family;Available PRN/intermittently Type of Home: Mobile home Home Access: Stairs to enter   Entrance Stairs-Number of Steps: 3   Home Layout: One level Home Equipment: Cane - quad      Prior Function Prior Level of Function : Independent/Modified Independent               ADLs Comments: Patient does not drive, family assists with community mobility and groceries.     Extremity/Trunk Assessment   Upper Extremity Assessment Upper Extremity Assessment: Defer to OT evaluation    Lower Extremity Assessment Lower Extremity Assessment: Generalized weakness    Cervical / Trunk Assessment Cervical / Trunk Assessment: Normal  Communication   Communication Communication: No apparent difficulties    Cognition Arousal: Alert Behavior During Therapy: WFL for tasks assessed/performed   PT -  Cognitive impairments: No apparent impairments                         Following commands: Intact       Cueing Cueing Techniques: Verbal cues     General Comments      Exercises     Assessment/Plan    PT Assessment Patient does not need any further PT services  PT Problem List         PT Treatment Interventions      PT Goals (Current goals can be found in the Care  Plan section)  Acute Rehab PT Goals PT Goal Formulation: With patient Time For Goal Achievement: 01/12/24 Potential to Achieve Goals: Good    Frequency       Co-evaluation               AM-PAC PT 6 Clicks Mobility  Outcome Measure Help needed turning from your back to your side while in a flat bed without using bedrails?: None Help needed moving from lying on your back to sitting on the side of a flat bed without using bedrails?: None Help needed moving to and from a bed to a chair (including a wheelchair)?: None Help needed standing up from a chair using your arms (e.g., wheelchair or bedside chair)?: None Help needed to walk in hospital room?: None Help needed climbing 3-5 steps with a railing? : None 6 Click Score: 24    End of Session   Activity Tolerance: Patient tolerated treatment well Patient left: in bed;with call bell/phone within reach;with family/visitor present Nurse Communication: Mobility status PT Visit Diagnosis: Other abnormalities of gait and mobility (R26.89)    Time: 8442-8388 PT Time Calculation (min) (ACUTE ONLY): 14 min   Charges:   PT Evaluation $PT Eval Low Complexity: 1 Low   PT General Charges $$ ACUTE PT VISIT: 1 Visit         Johana RAMAN, PT DPT Acute Rehabilitation Services Secure Chat Preferred  Office [phone]   Johana FORBES [place] 12/29/2023, 4:29 PM

## 2023-12-30 ENCOUNTER — Inpatient Hospital Stay (HOSPITAL_COMMUNITY)

## 2023-12-30 DIAGNOSIS — I7090 Unspecified atherosclerosis: Secondary | ICD-10-CM | POA: Diagnosis not present

## 2023-12-30 DIAGNOSIS — I639 Cerebral infarction, unspecified: Secondary | ICD-10-CM

## 2023-12-30 DIAGNOSIS — F1410 Cocaine abuse, uncomplicated: Secondary | ICD-10-CM | POA: Diagnosis not present

## 2023-12-30 DIAGNOSIS — I63432 Cerebral infarction due to embolism of left posterior cerebral artery: Secondary | ICD-10-CM | POA: Diagnosis not present

## 2023-12-30 DIAGNOSIS — R29701 NIHSS score 1: Secondary | ICD-10-CM | POA: Diagnosis not present

## 2023-12-30 DIAGNOSIS — F17200 Nicotine dependence, unspecified, uncomplicated: Secondary | ICD-10-CM | POA: Insufficient documentation

## 2023-12-30 LAB — CBG MONITORING, ED: Glucose-Capillary: 136 mg/dL — ABNORMAL HIGH (ref 70–99)

## 2023-12-30 MED ORDER — SODIUM CHLORIDE 0.9 % IV SOLN: INTRAVENOUS | Status: DC

## 2023-12-30 NOTE — Discharge Summary (Incomplete)
 Stroke Discharge Summary  Patient ID: [name]   MRN: [id number]      DOB: 1971-02-08  Date of Admission: 12/28/2023 Date of Discharge: 12/30/2023  Attending Physician:  Stroke, Md, MD, Stroke MD Consultant(s):   {consultation:[id number]}*** Patient's PCP:  Leonce Lucie PARAS, PA-C  Discharge Diagnoses: *** Principal Problem:   Acute ischemic stroke [facility]) Active Problems:   Stroke determined by clinical assessment (HCC)   Medications to be continued on Rehab Allergies as of 12/30/2023       Reactions   Darvocet [propoxyphene N-acetaminophen ] Hives   Lyrica [pregabalin] Swelling     Med Rec must be completed prior to using this [facility]***       LABORATORY STUDIES CBC    Component Value Date/Time   WBC 6.2 12/28/2023 1458   RBC 4.22 12/28/2023 1458   HGB 13.7 12/28/2023 1458   HCT 40.0 12/28/2023 1458   PLT 197 12/28/2023 1458   MCV 94.8 12/28/2023 1458   MCH 32.5 12/28/2023 1458   MCHC 34.3 12/28/2023 1458   RDW 13.1 12/28/2023 1458   LYMPHSABS 2.2 12/28/2023 1530   MONOABS 0.5 12/28/2023 1530   EOSABS 0.2 12/28/2023 1530   BASOSABS 0.0 12/28/2023 1530   CMP    Component Value Date/Time   NA 136 12/28/2023 1458   K 3.0 (L) 12/28/2023 1458   CL 104 12/28/2023 1458   CO2 26 12/28/2023 1458   GLUCOSE 83 12/28/2023 1458   BUN <5 (L) 12/28/2023 1458   CREATININE 0.93 12/28/2023 1458   CALCIUM  8.3 (L) 12/28/2023 1458   PROT 6.6 12/28/2023 1458   ALBUMIN 3.7 12/28/2023 1458   AST 18 12/28/2023 1458   ALT 13 12/28/2023 1458   ALKPHOS 96 12/28/2023 1458   BILITOT 0.7 12/28/2023 1458   GFRNONAA >60 12/28/2023 1458   GFRAA 88 (L) 03/15/2014 0632   COAGS Lab Results  Component Value Date   INR 0.9 12/28/2023   Lipid Panel    Component Value Date/Time   CHOL 167 12/29/2023 0520   TRIG 55 12/29/2023 0520   HDL 36 (L) 12/29/2023 0520   CHOLHDL 4.6 12/29/2023 0520   VLDL 11 12/29/2023 0520   LDLCALC 120 (H) 12/29/2023 0520   HgbA1C  Lab  Results  Component Value Date   HGBA1C 5.0 12/28/2023   Urinalysis    Component Value Date/Time   COLORURINE STRAW (A) 03/22/2022 1730   APPEARANCEUR CLEAR 03/22/2022 1730   LABSPEC 1.006 03/22/2022 1730   PHURINE 6.0 03/22/2022 1730   GLUCOSEU NEGATIVE 03/22/2022 1730   HGBUR SMALL (A) 03/22/2022 1730   BILIRUBINUR NEGATIVE 03/22/2022 1730   KETONESUR NEGATIVE 03/22/2022 1730   PROTEINUR NEGATIVE 03/22/2022 1730   UROBILINOGEN 0.2 01/10/2014 2014   NITRITE NEGATIVE 03/22/2022 1730   LEUKOCYTESUR NEGATIVE 03/22/2022 1730   Urine Drug Screen     Component Value Date/Time   LABOPIA NONE DETECTED 12/28/2023 1458   COCAINSCRNUR POSITIVE (A) 12/28/2023 1458   LABBENZ POSITIVE (A) 12/28/2023 1458   AMPHETMU NONE DETECTED 12/28/2023 1458   THCU NONE DETECTED 12/28/2023 1458   LABBARB NONE DETECTED 12/28/2023 1458    Alcohol Level    Component Value Date/Time   ETH <11 09/07/2012 1710     SIGNIFICANT DIAGNOSTIC STUDIES VAS US  LOWER EXTREMITY VENOUS (DVT) Result Date: 12/30/2023  Lower Venous DVT Study Patient Name:  [name]  Date of Exam:   12/30/2023 Medical Rec #: [id number]  Accession #:    [id number] Date of Birth: [birth year]          Patient Gender: F Patient Age:   53 years Exam Location:  [facility] Procedure:      VAS US  LOWER EXTREMITY VENOUS (DVT) Referring Phys: ARY XU --------------------------------------------------------------------------------  Indications: Stroke.  Comparison Study: Previous LLE venous duplex on 07/07/2015 was negative for DVT Performing Technologist: Ezzie Potters RVT, RDMS  Examination Guidelines: A complete evaluation includes B-mode imaging, spectral Doppler, color Doppler, and power Doppler as needed of all accessible portions of each vessel. Bilateral testing is considered an integral part of a complete examination. Limited examinations for reoccurring indications may be performed as noted. The reflux portion of the exam is  performed with the patient in reverse Trendelenburg.  +---------+---------------+---------+-----------+----------+--------------+ RIGHT    CompressibilityPhasicitySpontaneityPropertiesThrombus Aging +---------+---------------+---------+-----------+----------+--------------+ CFV      Full           Yes      Yes                                 +---------+---------------+---------+-----------+----------+--------------+ SFJ      Full                                                        +---------+---------------+---------+-----------+----------+--------------+ FV Prox  Full           Yes      Yes                                 +---------+---------------+---------+-----------+----------+--------------+ FV Mid   Full           Yes      Yes                                 +---------+---------------+---------+-----------+----------+--------------+ FV DistalFull           Yes      Yes                                 +---------+---------------+---------+-----------+----------+--------------+ PFV      Full                                                        +---------+---------------+---------+-----------+----------+--------------+ POP      Full           Yes      Yes                                 +---------+---------------+---------+-----------+----------+--------------+ PTV      Full                                                        +---------+---------------+---------+-----------+----------+--------------+  PERO     Full                                                        +---------+---------------+---------+-----------+----------+--------------+   +---------+---------------+---------+-----------+----------+--------------+ LEFT     CompressibilityPhasicitySpontaneityPropertiesThrombus Aging +---------+---------------+---------+-----------+----------+--------------+ CFV      Full           Yes      Yes                                  +---------+---------------+---------+-----------+----------+--------------+ SFJ      Full                                                        +---------+---------------+---------+-----------+----------+--------------+ FV Prox  Full           Yes      Yes                                 +---------+---------------+---------+-----------+----------+--------------+ FV Mid   Full           Yes      Yes                                 +---------+---------------+---------+-----------+----------+--------------+ FV DistalFull           Yes      Yes                                 +---------+---------------+---------+-----------+----------+--------------+ PFV      Full                                                        +---------+---------------+---------+-----------+----------+--------------+ POP      Full           Yes      Yes                                 +---------+---------------+---------+-----------+----------+--------------+ PTV      Full                                                        +---------+---------------+---------+-----------+----------+--------------+ PERO     Full                                                        +---------+---------------+---------+-----------+----------+--------------+  Summary: BILATERAL: - No evidence of deep vein thrombosis seen in the lower extremities, bilaterally. -No evidence of popliteal cyst, bilaterally.   *See table(s) above for measurements and observations.    Preliminary    VAS US  TRANSCRANIAL DOPPLER W BUBBLES Result Date: 12/30/2023  Transcranial Doppler with Bubble Patient Name:  [name]  Date of Exam:   12/30/2023 Medical Rec #: [id number]          Accession #:    [id number] Date of Birth: 01-21-1971          Patient Gender: F Patient Age:   10 years Exam Location:  [facility] Procedure:      VAS US  TRANSCRANIAL DOPPLER W BUBBLES Referring Phys: ARY XU  --------------------------------------------------------------------------------  Indications: Stroke. Limitations for diagnostic windows: Unable to insonate right transtemporal window. Unable to insonate left transtemporal window. Comparison Study: No previous exams Performing Technologist: Jody Hill RVT, RDMS  Examination Guidelines: A complete evaluation includes B-mode imaging, spectral Doppler, color Doppler, and power Doppler as needed of all accessible portions of each vessel. Bilateral testing is considered an integral part of a complete examination. Limited examinations for reoccurring indications may be performed as noted.  Summary: No HITS at rest or during Valsalva. Negative transcranial Doppler Bubble study with no evidence of right to left intracardiac communication.  A vascular evaluation was performed. The right carotid siphon was studied. An IV was inserted into the patient's right wrist. Verbal informed consent was obtained.  *See table(s) above for TCD measurements and observations.    Preliminary    MR ANGIO NECK W WO CONTRAST Result Date: 12/29/2023 CLINICAL DATA:  Initial evaluation for stroke, right ICA web versus dissection. EXAM: MRA NECK WITHOUT AND WITH CONTRAST TECHNIQUE: Multiplanar and multiecho pulse sequences of the neck were obtained without and with intravenous contrast. Angiographic images of the neck were obtained using MRA technique without and with intravenous contrast. CONTRAST:  7mL GADAVIST  GADOBUTROL  1 MMOL/ML IV SOLN COMPARISON:  CTA from earlier the same day. FINDINGS: AORTIC ARCH: Examination is technically limited as no axial T1 fat-sat dissection sequence was performed, limiting assessment for possible subtle dissection. Visualized aortic arch within normal limits for caliber with standard branch pattern. No stenosis about the origin the great vessels. RIGHT CAROTID SYSTEM: Right common and internal carotid arteries are patent with antegrade flow. Previously  identified subtle curvilinear linear defect traversing the proximal right ICA at the right carotid bulb again seen (series 88598, image 81), stable from prior CTA. No visible significant intimal irregularity. No filling defect to suggest thrombus. 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 No associated stenosis. Right ICA with the patent distally. LEFT CAROTID SYSTEM: Patent antegrade flow within the left common and internal carotid arteries. No evidence for dissection. Atheromatous irregularity about the mid left CCA with mild 25-30% narrowing (series [id number], image 13). No other hemodynamically significant stenosis about the left carotid artery system. VERTEBRAL ARTERIES: Both vertebral arteries arise from subclavian arteries. No proximal subclavian artery stenosis. Short-segment mild-to-moderate stenosis noted involving the proximal left V2 segment (series [id number], image 11). Vertebral arteries otherwise widely patent with antegrade flow. No dissection. Other: None. IMPRESSION: 1. Stable appearance of subtle curvilinear defect traversing the proximal right ICA at the right carotid bulb. Finding remains somewhat indeterminate, however, a carotid web is favored given the relative smooth  nature of this finding and lack of associated intimal irregularity, although a possible small chronic dissection flap remains difficult to exclude. No associated stenosis. A short interval follow-up CTA and/or MRA in 3-6 months is suggested for further evaluation and to evaluate the stability of this finding. 2. Atheromatous irregularity about the mid left CCA with associated mild 25-30% stenosis. 3. Short-segment mild-to-moderate stenosis involving the proximal left V2 segment. Electronically Signed   By: Morene Hoard M.D.    On: 12/29/2023 19:12   MR BRAIN WO CONTRAST Result Date: 12/29/2023 CLINICAL DATA:  Initial evaluation for acute neuro deficit, stroke suspected. EXAM: MRI HEAD WITHOUT CONTRAST TECHNIQUE: Multiplanar, multiecho pulse sequences of the brain and surrounding structures were obtained without intravenous contrast. COMPARISON:  Prior study from 12/28/2023. FINDINGS: Brain: Cerebral volume within normal limits. Few scattered patchy foci of T2/FLAIR signal abnormality seen involving the periventricular and deep white matter, nonspecific, but favored to reflect changes of mild chronic microvascular ischemic disease. Small volume patchy restricted diffusion seen involving the cortex of the left temporal occipital junction (series 2, images 29, 27). No associated hemorrhage or mass effect. No other evidence for acute or subacute ischemia. Gray-white matter differentiation otherwise maintained. No other areas of chronic cortical infarction. No acute or chronic intracranial blood products. No mass lesion, midline shift or mass effect. No hydrocephalus or extra-axial fluid collection. Pituitary gland within normal limits. Vascular: Major intracranial vascular flow voids are maintained. Skull and upper cervical spine: Craniocervical junction within normal limits. Bone marrow signal intensity within normal limits. No scalp soft tissue abnormality. Sinuses/Orbits: Globes orbital soft tissues demonstrate no acute finding. Paranasal sinuses are largely clear. No mastoid effusion. Other: None. IMPRESSION: 1. Small patchy acute ischemic nonhemorrhagic infarct involving the cortex of the left temporoccipital junction. 2. Underlying mild chronic microvascular ischemic disease. Electronically Signed   By: Morene Hoard M.D.   On: 12/29/2023 18:56   ECHOCARDIOGRAM COMPLETE Result Date: 12/29/2023    ECHOCARDIOGRAM REPORT   Patient Name:   [name] Date of Exam: 12/29/2023 Medical Rec #:  [id number]         Height:        69.5 in Accession #:    [id number]        Weight:       145.7 lb Date of Birth:  [birth year]         BSA:          1.815 m Patient Age:    53 years          BP:           108/63 mmHg Patient Gender: F                 HR:           63 bpm. Exam Location:  Inpatient Procedure: 2D Echo, Cardiac Doppler and Color Doppler (Both Spectral and Color            Flow Doppler were utilized during procedure). Indications:    Stroke I63.9  History:        Patient has no prior history of Echocardiogram examinations.                 Signs/Symptoms:Fatigue. H/O Cocaine use, Hyperlipidemia.  Sonographer:    BERNARDA ROCKS Referring Phys: [id number] [facility] KHALIQDINA IMPRESSIONS  1. Left ventricular ejection fraction, by estimation, is 60 to 65%. The left ventricle has normal function. The left ventricle has no regional wall motion abnormalities. Left ventricular  diastolic parameters were normal.  2. Right ventricular systolic function is normal. The right ventricular size is normal.  3. The mitral valve is normal in structure. Trivial mitral valve regurgitation. No evidence of mitral stenosis.  4. The aortic valve is tricuspid. Aortic valve regurgitation is not visualized. No aortic stenosis is present.  5. The inferior vena cava is dilated in size with >50% respiratory variability, suggesting right atrial pressure of 8 mmHg. FINDINGS  Left Ventricle: Left ventricular ejection fraction, by estimation, is 60 to 65%. The left ventricle has normal function. The left ventricle has no regional wall motion abnormalities. The left ventricular internal cavity size was normal in size. There is  no left ventricular hypertrophy. Left ventricular diastolic parameters were normal. Right Ventricle: The right ventricular size is normal. No increase in right ventricular wall thickness. Right ventricular systolic function is normal. Left Atrium: Left atrial size was normal in size. Right Atrium: Right atrial size was normal in size. Pericardium: There  is no evidence of pericardial effusion. Mitral Valve: The mitral valve is normal in structure. Trivial mitral valve regurgitation. No evidence of mitral valve stenosis. MV peak gradient, 4.1 mmHg. The mean mitral valve gradient is 1.0 mmHg. Tricuspid Valve: The tricuspid valve is normal in structure. Tricuspid valve regurgitation is not demonstrated. No evidence of tricuspid stenosis. Aortic Valve: The aortic valve is tricuspid. Aortic valve regurgitation is not visualized. No aortic stenosis is present. Aortic valve mean gradient measures 3.0 mmHg. Aortic valve peak gradient measures 6.9 mmHg. Aortic valve area, by VTI measures 2.43 cm. Pulmonic Valve: The pulmonic valve was normal in structure. Pulmonic valve regurgitation is not visualized. No evidence of pulmonic stenosis. Aorta: The aortic root is normal in size and structure. Venous: The inferior vena cava is dilated in size with greater than 50% respiratory variability, suggesting right atrial pressure of 8 mmHg. IAS/Shunts: No atrial level shunt detected by color flow Doppler.  LEFT VENTRICLE PLAX 2D LVIDd:         4.90 cm     Diastology LVIDs:         3.40 cm     LV e' medial:    11.50 cm/s LV PW:         0.60 cm     LV E/e' medial:  7.9 LV IVS:        0.70 cm     LV e' lateral:   12.80 cm/s LVOT diam:     2.00 cm     LV E/e' lateral: 7.1 LV SV:         73 LV SV Index:   40 LVOT Area:     3.14 cm  LV Volumes (MOD) LV vol d, MOD A2C: 99.2 ml LV vol d, MOD A4C: 96.4 ml LV vol s, MOD A2C: 38.1 ml LV vol s, MOD A4C: 41.7 ml LV SV MOD A2C:     61.1 ml LV SV MOD A4C:     96.4 ml LV SV MOD BP:      58.1 ml RIGHT VENTRICLE             IVC RV Basal diam:  3.00 cm     IVC diam: 2.10 cm RV S prime:     10.10 cm/s TAPSE (M-mode): 2.0 cm LEFT ATRIUM             Index        RIGHT ATRIUM           Index LA diam:  2.70 cm 1.49 cm/m   RA Area:     11.30 cm LA Vol (A2C):   45.1 ml 24.85 ml/m  RA Volume:   24.20 ml  13.33 ml/m LA Vol (A4C):   33.2 ml 18.29 ml/m  LA Biplane Vol: 39.3 ml 21.65 ml/m  AORTIC VALVE                    PULMONIC VALVE AV Area (Vmax):    2.57 cm     PV Vmax:          0.97 m/s AV Area (Vmean):   2.25 cm     PV Peak grad:     3.8 mmHg AV Area (VTI):     2.43 cm     PR End Diast Vel: 1.00 msec AV Vmax:           131.00 cm/s AV Vmean:          86.800 cm/s AV VTI:            0.301 m AV Peak Grad:      6.9 mmHg AV Mean Grad:      3.0 mmHg LVOT Vmax:         107.00 cm/s LVOT Vmean:        62.100 cm/s LVOT VTI:          0.233 m LVOT/AV VTI ratio: 0.77  AORTA Ao Root diam: 2.80 cm Ao Asc diam:  3.00 cm MITRAL VALVE MV Area (PHT): 2.82 cm    SHUNTS MV Area VTI:   2.16 cm    Systemic VTI:  0.23 m MV Peak grad:  4.1 mmHg    Systemic Diam: 2.00 cm MV Mean grad:  1.0 mmHg MV Vmax:       1.01 m/s MV Vmean:      56.5 cm/s MV Decel Time: 269 msec MR Peak grad: 19.9 mmHg MR Vmax:      223.00 cm/s MV E velocity: 90.30 cm/s MV A velocity: 73.30 cm/s MV E/A ratio:  1.23 Morene Brownie Electronically signed by Morene Brownie Signature Date/Time: 12/29/2023/1:30:42 PM    Final    CT ANGIO HEAD NECK W WO CM Result Date: 12/28/2023 EXAM: CTA HEAD AND NECK WITH AND WITHOUT 12/28/2023 04:54:56 PM TECHNIQUE: CTA of the head and neck was performed with and without the administration of intravenous contrast. 75 mL of iohexol (OMNIPAQUE) 350 MG/ML injection was administered. Multiplanar 2D and/or 3D reformatted images are provided for review. Automated exposure control, iterative reconstruction, and/or weight based adjustment of the mA/kV was utilized to reduce the radiation dose to as low as reasonably achievable. Stenosis of the internal carotid arteries measured using NASCET criteria. COMPARISON: None available CLINICAL HISTORY: Acute neuro deficit with stroke suspected, presenting with disabling dysarthria and mild left-sided weakness. The patient is a 53 year old woman with a history of anxiety, chronic back and neck pain, hypercholesterolemia, hypothyroidism,  tardive dyskinesia, reflex sympathetic dystrophy, and remote CVA without residual deficits. A prior head CT showed no acute intracranial process, no blood, and ASPECTS 10/10. FINDINGS: CTA NECK: AORTIC ARCH AND ARCH VESSELS: No dissection or arterial injury. No significant stenosis of the brachiocephalic or subclavian arteries. CERVICAL CAROTID ARTERIES: A curvilinear defect is present at the right ICA; this may represent a focal web or contained dissection. There is no dissection in the more distal right ICA. The left ICA is patent without significant stenosis or dissection. No hemodynamically significant stenosis by NASCET criteria. CERVICAL VERTEBRAL ARTERIES: No dissection,  arterial injury, or significant stenosis. LUNGS AND MEDIASTINUM: Unremarkable. SOFT TISSUES: Thyroidectomy. BONES: Cervical spine fusion is noted at C5-C6 and C6-C7. CTA HEAD: ANTERIOR CIRCULATION: Minimal atherosclerotic calcifications are present within the cavernous internal carotid arteries bilaterally. No significant stenosis of the anterior cerebral arteries. No significant stenosis of the middle cerebral arteries. No aneurysm. POSTERIOR CIRCULATION: No significant stenosis of the posterior cerebral arteries. No significant stenosis of the basilar artery. No significant stenosis of the vertebral arteries. No aneurysm. OTHER: No dural venous sinus thrombosis on this non-dedicated study. IMPRESSION: 1. No large vessel occlusion or hemodynamically significant stenosis in the head or neck. 2. Curvilinear defect in the right ICA, possibly representing a focal web or contained dissection. No dissection in the more distal right ICA. 3. Minimal atherosclerotic calcifications within the cavernous internal carotid arteries bilaterally. Findings were called to Dr. Matthews at 5:30 pm Electronically signed by: Lonni Necessary MD 12/28/2023 05:38 PM EDT RP Workstation: [id number]   CT HEAD CODE STROKE WO CONTRAST Result Date: 12/28/2023 EXAM: CT  HEAD WITHOUT CONTRAST 12/28/2023 03:07:44 PM TECHNIQUE: CT of the head was performed without the administration of intravenous contrast. Automated exposure control, iterative reconstruction, and/or weight based adjustment of the mA/kV was utilized to reduce the radiation dose to as low as reasonably achievable. COMPARISON: CT head without contrast 05/05/2013. CLINICAL HISTORY: Neuro deficit, acute, stroke suspected. Slurred speech. Headache on left side. LKW 1400. FINDINGS: BRAIN AND VENTRICLES: No acute hemorrhage. No evidence of acute infarct. No hydrocephalus. No extra-axial collection. No mass effect or midline shift. ORBITS: No acute abnormality. SINUSES: No acute abnormality. SOFT TISSUES AND SKULL: No acute soft tissue abnormality. No skull fracture. [place] stroke program early CT (aspect) score Ganglionic (caudate, ic, Lentiform Nucleus, insula, M1-m3): 7 Supraganglionic (m4-m6): 3 Total: 10 IMPRESSION: 1. No acute intracranial abnormality.  ASPECTS 10/10 The pertinent results were texted to Dr. Matthews via the [facility] system at 3:14 pm. Electronically signed by: Lonni Necessary MD 12/28/2023 03:18 PM EDT RP Workstation: [id number]       HISTORY OF PRESENT ILLNESS ***   HOSPITAL COURSE Stroke: left MCA small cortical infarcts s/p TNK, etiology atherosclerosis due to uncontrolled risk factors such as smoker and cocaine use vs. Cardioembolic source given cortical location  Code Stroke, CT head with no acute abnormality. ASPECTS 10.    CTA head & neck no LVO, curvilinear defect in right ICA, minimal atherosclerotic calcifications within cavernous internal carotid arteries bilaterally MRI pending 2D Echo EF 60-65% LE venous doppler no evidence of DVT bilaterally TCD bubble study negative TEE *** LDL 120 HgbA1c 5.0 UDS + cocaine and benzo VTE prophylaxis -SCDs No antithrombotic prior to admission, now on aspirin 81 mg and plavix daily for 3 weeks and then ASA alone. Therapy recommendations:  none  Disposition: Pending   Hx of Stroke/TIA Son reports 03/2022 admitted OSH for left facial droop and left sided weakness with confusion that lasted a week but apparently MRI negative.  Patient reports her PCP referred to this as a mini stroke.   Hyperlipidemia Home meds: None LDL 120, goal < 70 Continue rosuvastatin 20 mg daily on discharge  Tobacco Abuse Patient smokes 1/2 pack per day for 20 years      Ready to quit? No Nicotine  replacement therapy provided   Cocaine Abuse Patient uses cocaine UDS positive for cocaine       Ready to quit? No TOC consult for cessation placed   Other Stroke Risk Factors None   Other Active Problems Hypothyroidism-continue home  Synthroid  MDD -continue home Effexor and Celexa  GAD -continue home Xanax  Tardive dyskinesia - followed with Dr. Evonnie at [facility] Healthcare Hx of pseudoseizure  DISCHARGE EXAM Blood pressure 138/76, pulse (!) 54, temperature 97.8 F (36.6 C), temperature source Oral, resp. rate 17, height 5' 9.5 (1.765 m), weight 66.1 kg, SpO2 99%. PHYSICAL EXAM General:  Alert, patient laying down in bed in NAD Psych:  Mood and affect appropriate for situation CV: Regular rate and rhythm on monitor Respiratory:  Regular, unlabored respirations on room air   NEURO:  Mental Status: AA&Ox3, patient is able to give clear and coherent history Speech/Language: speech is with mild dysarthria or aphasia.  Naming, repetition, fluency, and comprehension intact.   Cranial Nerves:  II: PERRL. Visual fields full.  III, IV, VI: EOMI. Eyelids elevate symmetrically.  V: Sensation is intact to light touch and symmetrical to face.  VII: Face is symmetrical resting and smiling VIII: hearing intact to voice. IX, X: Palate elevates symmetrically. Phonation is normal.  KP:Dynloizm shrug 5/5. XII: tongue is midline without fasciculations. Motor: 5/5 strength to all muscle groups tested.  Tone: is normal and bulk is normal Sensation- Intact to light  touch bilaterally. Extinction absent to light touch to DSS.   Coordination: FTN intact bilaterally, HKS: no ataxia in BLE.No drift.  Gait- deferred   Most Recent NIH 1  Discharge Diet      Diet   Diet Heart Room service appropriate? Yes with Assist; Fluid consistency: Thin   liquids  DISCHARGE PLAN Disposition:  Transfer to [facility] for ongoing PT, OT and ST aspirin 81 mg daily and clopidogrel 75 mg daily for secondary stroke prevention for 3 weeks then ASA alone. Recommend ongoing stroke risk factor control by Primary Care Physician at time of discharge from inpatient rehabilitation. Follow-up PCP Leonce Lucie PARAS, PA-C in 2 weeks following discharge from rehab. Follow-up in [facility] in 8 weeks following discharge from rehab, office to schedule an appointment.   *** minutes were spent preparing discharge.  ***

## 2023-12-30 NOTE — TOC CAGE-AID Note (Signed)
 Transition of Care [facility]) - CAGE-AID Screening   Patient Details  Name: [name] MRN: [id number] Date of Birth: July 05, 1970  Transition of Care [facility]) CM/SW Contact:    [name], RN Phone Number: 12/30/2023, 11:54 AM   Clinical Narrative: Pt asked that resources be placed on AVS.   CAGE-AID Screening:               Substance Abuse Education Offered: Yes (on AVS)

## 2023-12-30 NOTE — Plan of Care (Signed)
 Talking about stopping smoking and doing the right thing when she gets home so she will not have another stroke.    Problem: Clinical Measurements: Goal: Cardiovascular complication will be avoided Outcome: Progressing   Problem: Activity: Goal: Risk for activity intolerance will decrease Outcome: Progressing   Problem: Education: Goal: Knowledge of secondary prevention will improve (MUST DOCUMENT ALL) Outcome: Progressing   Problem: Education: Goal: Knowledge of patient specific risk factors will improve (DELETE if not current risk factor) Outcome: Progressing   Problem: Ischemic Stroke/TIA Tissue Perfusion: Goal: Complications of ischemic stroke/TIA will be minimized Outcome: Progressing   Problem: Coping: Goal: Will verbalize positive feelings about self Outcome: Progressing

## 2023-12-30 NOTE — Progress Notes (Signed)
   [facility] has been requested to perform a transesophageal echocardiogram on [name] for stroke.     The patient does NOT have any absolute or relative contraindications to a Transesophageal Echocardiogram (TEE).  The patient has: No other conditions that may impact this procedure.    After careful review of history and examination, the risks and benefits of transesophageal echocardiogram have been explained including risks of esophageal damage, perforation (1:10,000 risk), bleeding, pharyngeal hematoma as well as other potential complications associated with conscious sedation including aspiration, arrhythmia, respiratory failure and death. Alternatives to treatment were discussed, questions were answered. Patient is willing to proceed.   Bonney Leontine LOISE Garrick, PA-C  12/30/2023 12:15 PM

## 2023-12-30 NOTE — Plan of Care (Signed)
 Problem: Education: Goal: Knowledge of General Education information will improve Description: Including pain rating scale, medication(s)/side effects and non-pharmacologic comfort measures 12/30/2023 0523 by Baldwin Doran BIRCH, RN Outcome: Progressing 12/30/2023 0521 by Baldwin Doran BIRCH, RN Outcome: Progressing   Problem: Health Behavior/Discharge Planning: Goal: Ability to manage health-related needs will improve 12/30/2023 0523 by Baldwin Doran BIRCH, RN Outcome: Progressing 12/30/2023 0521 by Baldwin Doran BIRCH, RN Outcome: Progressing   Problem: Clinical Measurements: Goal: Ability to maintain clinical measurements within normal limits will improve 12/30/2023 0523 by Baldwin Doran BIRCH, RN Outcome: Progressing 12/30/2023 0521 by Baldwin Doran BIRCH, RN Outcome: Progressing Goal: Will remain free from infection 12/30/2023 0523 by Baldwin Doran BIRCH, RN Outcome: Progressing 12/30/2023 0521 by Baldwin Doran BIRCH, RN Outcome: Progressing Goal: Diagnostic test results will improve 12/30/2023 0523 by Baldwin Doran BIRCH, RN Outcome: Progressing 12/30/2023 0521 by Baldwin Doran D, RN Outcome: Progressing Goal: Respiratory complications will improve 12/30/2023 0523 by Baldwin Doran BIRCH, RN Outcome: Progressing 12/30/2023 0521 by Baldwin Doran D, RN Outcome: Progressing Goal: Cardiovascular complication will be avoided 12/30/2023 0523 by Baldwin Doran D, RN Outcome: Progressing 12/30/2023 0521 by Baldwin Doran BIRCH, RN Outcome: Progressing   Problem: Activity: Goal: Risk for activity intolerance will decrease 12/30/2023 0523 by Baldwin Doran BIRCH, RN Outcome: Progressing 12/30/2023 0521 by Baldwin Doran BIRCH, RN Outcome: Progressing   Problem: Nutrition: Goal: Adequate nutrition will be maintained 12/30/2023 0523 by Baldwin Doran BIRCH, RN Outcome: Progressing 12/30/2023 0521 by Baldwin Doran D, RN Outcome: Progressing   Problem: Coping: Goal: Level of anxiety will  decrease 12/30/2023 0523 by Baldwin Doran BIRCH, RN Outcome: Progressing 12/30/2023 0521 by Baldwin Doran BIRCH, RN Outcome: Progressing   Problem: Elimination: Goal: Will not experience complications related to bowel motility 12/30/2023 0523 by Baldwin Doran BIRCH, RN Outcome: Progressing 12/30/2023 0521 by Baldwin Doran BIRCH, RN Outcome: Progressing Goal: Will not experience complications related to urinary retention 12/30/2023 0523 by Baldwin Doran BIRCH, RN Outcome: Progressing 12/30/2023 0521 by Baldwin Doran BIRCH, RN Outcome: Progressing   Problem: Pain Managment: Goal: General experience of comfort will improve and/or be controlled 12/30/2023 0523 by Baldwin Doran BIRCH, RN Outcome: Progressing 12/30/2023 0521 by Baldwin Doran BIRCH, RN Outcome: Progressing   Problem: Safety: Goal: Ability to remain free from injury will improve 12/30/2023 0523 by Baldwin Doran BIRCH, RN Outcome: Progressing 12/30/2023 0521 by Baldwin Doran BIRCH, RN Outcome: Progressing   Problem: Skin Integrity: Goal: Risk for impaired skin integrity will decrease 12/30/2023 0523 by Baldwin Doran BIRCH, RN Outcome: Progressing 12/30/2023 0521 by Baldwin Doran BIRCH, RN Outcome: Progressing   Problem: Education: Goal: Knowledge of disease or condition will improve 12/30/2023 0523 by Baldwin Doran BIRCH, RN Outcome: Progressing 12/30/2023 0521 by Baldwin Doran BIRCH, RN Outcome: Progressing Goal: Knowledge of secondary prevention will improve (MUST DOCUMENT ALL) 12/30/2023 0523 by Baldwin Doran BIRCH, RN Outcome: Progressing 12/30/2023 0521 by Baldwin Doran BIRCH, RN Outcome: Progressing Goal: Knowledge of patient specific risk factors will improve (DELETE if not current risk factor) 12/30/2023 0523 by Baldwin Doran BIRCH, RN Outcome: Progressing 12/30/2023 0521 by Baldwin Doran BIRCH, RN Outcome: Progressing   Problem: Ischemic Stroke/TIA Tissue Perfusion: Goal: Complications of ischemic stroke/TIA will be  minimized 12/30/2023 0523 by Baldwin Doran BIRCH, RN Outcome: Progressing 12/30/2023 0521 by Baldwin Doran BIRCH, RN Outcome: Progressing   Problem: Coping: Goal: Will verbalize positive feelings about self 12/30/2023 0523 by Baldwin Doran BIRCH, RN Outcome: Progressing 12/30/2023 0521 by Baldwin Doran BIRCH, RN Outcome: Progressing Goal: Will identify appropriate support needs 12/30/2023  [id number] by Baldwin Doran BIRCH, RN Outcome: Progressing 12/30/2023 0521 by Baldwin Doran BIRCH, RN Outcome: Progressing   Problem: Health Behavior/Discharge Planning: Goal: Ability to manage health-related needs will improve 12/30/2023 0523 by Baldwin Doran BIRCH, RN Outcome: Progressing 12/30/2023 0521 by Baldwin Doran BIRCH, RN Outcome: Progressing Goal: Goals will be collaboratively established with patient/family 12/30/2023 [phone] by Baldwin Doran BIRCH, RN Outcome: Progressing 12/30/2023 0521 by Baldwin Doran BIRCH, RN Outcome: Progressing   Problem: Self-Care: Goal: Ability to participate in self-care as condition permits will improve 12/30/2023 0523 by Baldwin Doran BIRCH, RN Outcome: Progressing 12/30/2023 0521 by Baldwin Doran BIRCH, RN Outcome: Progressing Goal: Verbalization of feelings and concerns over difficulty with self-care will improve 12/30/2023 0523 by Baldwin Doran BIRCH, RN Outcome: Progressing 12/30/2023 0521 by Baldwin Doran BIRCH, RN Outcome: Progressing Goal: Ability to communicate needs accurately will improve 12/30/2023 0523 by Baldwin Doran BIRCH, RN Outcome: Progressing 12/30/2023 0521 by Baldwin Doran BIRCH, RN Outcome: Progressing   Problem: Nutrition: Goal: Risk of aspiration will decrease 12/30/2023 0523 by Baldwin Doran BIRCH, RN Outcome: Progressing 12/30/2023 0521 by Baldwin Doran BIRCH, RN Outcome: Progressing Goal: Dietary intake will improve 12/30/2023 0523 by Baldwin Doran BIRCH, RN Outcome: Progressing 12/30/2023 0521 by Baldwin Doran BIRCH, RN Outcome: Progressing   Problem:  Education: Goal: Knowledge of disease or condition will improve Outcome: Progressing Goal: Knowledge of secondary prevention will improve (MUST DOCUMENT ALL) Outcome: Progressing Goal: Knowledge of patient specific risk factors will improve (DELETE if not current risk factor) Outcome: Progressing   Problem: Ischemic Stroke/TIA Tissue Perfusion: Goal: Complications of ischemic stroke/TIA will be minimized Outcome: Progressing   Problem: Coping: Goal: Will verbalize positive feelings about self Outcome: Progressing Goal: Will identify appropriate support needs Outcome: Progressing   Problem: Health Behavior/Discharge Planning: Goal: Ability to manage health-related needs will improve Outcome: Progressing Goal: Goals will be collaboratively established with patient/family Outcome: Progressing   Problem: Self-Care: Goal: Ability to participate in self-care as condition permits will improve Outcome: Progressing Goal: Verbalization of feelings and concerns over difficulty with self-care will improve Outcome: Progressing Goal: Ability to communicate needs accurately will improve Outcome: Progressing   Problem: Nutrition: Goal: Risk of aspiration will decrease Outcome: Progressing Goal: Dietary intake will improve Outcome: Progressing

## 2023-12-30 NOTE — H&P (View-Only) (Signed)
 STROKE TEAM PROGRESS NOTE   INTERIM HISTORY/SUBJECTIVE Patient reports that she feels like she is back to her baseline and would like to go home today.  We discussed she would need some additional testing and scheduled her for TEE tomorrow.  Patient voiced understanding of this.  Her sister is present in the room with her today, and she has no questions. No other concerns at this time.  OBJECTIVE  CBC    Component Value Date/Time   WBC 6.2 12/28/2023 1458   RBC 4.22 12/28/2023 1458   HGB 13.7 12/28/2023 1458   HCT 40.0 12/28/2023 1458   PLT 197 12/28/2023 1458   MCV 94.8 12/28/2023 1458   MCH 32.5 12/28/2023 1458   MCHC 34.3 12/28/2023 1458   RDW 13.1 12/28/2023 1458   LYMPHSABS 2.2 12/28/2023 1530   MONOABS 0.5 12/28/2023 1530   EOSABS 0.2 12/28/2023 1530   BASOSABS 0.0 12/28/2023 1530    BMET    Component Value Date/Time   NA 136 12/28/2023 1458   K 3.0 (L) 12/28/2023 1458   CL 104 12/28/2023 1458   CO2 26 12/28/2023 1458   GLUCOSE 83 12/28/2023 1458   BUN <5 (L) 12/28/2023 1458   CREATININE 0.93 12/28/2023 1458   CALCIUM  8.3 (L) 12/28/2023 1458   GFRNONAA >60 12/28/2023 1458    IMAGING past 24 hours MR ANGIO NECK W WO CONTRAST Result Date: 12/29/2023 CLINICAL DATA:  Initial evaluation for stroke, right ICA web versus dissection. EXAM: MRA NECK WITHOUT AND WITH CONTRAST TECHNIQUE: Multiplanar and multiecho pulse sequences of the neck were obtained without and with intravenous contrast. Angiographic images of the neck were obtained using MRA technique without and with intravenous contrast. CONTRAST:  7mL GADAVIST  GADOBUTROL  1 MMOL/ML IV SOLN COMPARISON:  CTA from earlier the same day. FINDINGS: AORTIC ARCH: Examination is technically limited as no axial T1 fat-sat dissection sequence was performed, limiting assessment for possible subtle dissection. Visualized aortic arch within normal limits for caliber with standard branch pattern. No stenosis about the origin the great  vessels. RIGHT CAROTID SYSTEM: Right common and internal carotid arteries are patent with antegrade flow. Previously identified subtle curvilinear linear defect traversing the proximal right ICA at the right carotid bulb again seen (series [id number], image 81), stable from prior CTA. No visible significant intimal irregularity. No filling defect to suggest thrombus. 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 No associated stenosis. Right ICA with the patent distally. LEFT CAROTID SYSTEM: Patent antegrade flow within the left common and internal carotid arteries. No evidence for dissection. Atheromatous irregularity about the mid left CCA with mild 25-30% narrowing (series [id number], image 13). No other hemodynamically significant stenosis about the left carotid artery system. VERTEBRAL ARTERIES: Both vertebral arteries arise from subclavian arteries. No proximal subclavian artery stenosis. Short-segment mild-to-moderate stenosis noted involving the proximal left V2 segment (series [id number], image 11). Vertebral arteries otherwise widely patent with antegrade flow. No dissection. Other: None. IMPRESSION: 1. Stable appearance of subtle curvilinear defect traversing the proximal right ICA at the right carotid bulb. 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 No associated stenosis. A short interval follow-up CTA and/or MRA in 3-6 months is suggested for further  evaluation and to evaluate the stability of this finding. 2. Atheromatous irregularity about the mid left CCA with associated mild 25-30% stenosis. 3. Short-segment  mild-to-moderate stenosis involving the proximal left V2 segment. Electronically Signed   By: Morene Hoard M.D.   On: 12/29/2023 19:12   MR BRAIN WO CONTRAST Result Date: 12/29/2023 CLINICAL DATA:  Initial evaluation for acute neuro deficit, stroke suspected. EXAM: MRI HEAD WITHOUT CONTRAST TECHNIQUE: Multiplanar, multiecho pulse sequences of the brain and surrounding structures were obtained without intravenous contrast. COMPARISON:  Prior study from 12/28/2023. FINDINGS: Brain: Cerebral volume within normal limits. Few scattered patchy foci of T2/FLAIR signal abnormality seen involving the periventricular and deep white matter, nonspecific, but favored to reflect changes of mild chronic microvascular ischemic disease. Small volume patchy restricted diffusion seen involving the cortex of the left temporal occipital junction (series 2, images 29, 27). No associated hemorrhage or mass effect. No other evidence for acute or subacute ischemia. Gray-white matter differentiation otherwise maintained. No other areas of chronic cortical infarction. No acute or chronic intracranial blood products. No mass lesion, midline shift or mass effect. No hydrocephalus or extra-axial fluid collection. Pituitary gland within normal limits. Vascular: Major intracranial vascular flow voids are maintained. Skull and upper cervical spine: Craniocervical junction within normal limits. Bone marrow signal intensity within normal limits. No scalp soft tissue abnormality. Sinuses/Orbits: Globes orbital soft tissues demonstrate no acute finding. Paranasal sinuses are largely clear. No mastoid effusion. Other: None. IMPRESSION: 1. Small patchy acute ischemic nonhemorrhagic infarct involving the cortex of the left temporoccipital junction. 2. Underlying mild chronic microvascular ischemic disease. Electronically Signed   By: Morene Hoard M.D.   On: 12/29/2023 18:56   ECHOCARDIOGRAM COMPLETE Result Date: 12/29/2023     ECHOCARDIOGRAM REPORT   Patient Name:   [name] Date of Exam: 12/29/2023 Medical Rec #:  [id number]         Height:       69.5 in Accession #:    [id number]        Weight:       145.7 lb Date of Birth:  1970/08/17         BSA:          1.815 m Patient Age:    53 years          BP:           108/63 mmHg Patient Gender: F                 HR:           63 bpm. Exam Location:  Inpatient Procedure: 2D Echo, Cardiac Doppler and Color Doppler (Both Spectral and Color            Flow Doppler were utilized during procedure). Indications:    Stroke I63.9  History:        Patient has no prior history of Echocardiogram examinations.                 Signs/Symptoms:Fatigue. H/O Cocaine use, Hyperlipidemia.  Sonographer:    BERNARDA ROCKS Referring Phys: [id number] [facility] KHALIQDINA IMPRESSIONS  1. Left ventricular ejection fraction, by estimation, is 60 to 65%. The left ventricle has normal function. The left ventricle has no regional wall motion abnormalities. Left ventricular diastolic parameters were normal.  2. Right ventricular systolic function is normal. The right ventricular size is normal.  3. The mitral valve is normal in structure. Trivial mitral valve regurgitation. No evidence of mitral stenosis.  4.  The aortic valve is tricuspid. Aortic valve regurgitation is not visualized. No aortic stenosis is present.  5. The inferior vena cava is dilated in size with >50% respiratory variability, suggesting right atrial pressure of 8 mmHg. FINDINGS  Left Ventricle: Left ventricular ejection fraction, by estimation, is 60 to 65%. The left ventricle has normal function. The left ventricle has no regional wall motion abnormalities. The left ventricular internal cavity size was normal in size. There is  no left ventricular hypertrophy. Left ventricular diastolic parameters were normal. Right Ventricle: The right ventricular size is normal. No increase in right ventricular wall thickness. Right ventricular systolic function is  normal. Left Atrium: Left atrial size was normal in size. Right Atrium: Right atrial size was normal in size. Pericardium: There is no evidence of pericardial effusion. Mitral Valve: The mitral valve is normal in structure. Trivial mitral valve regurgitation. No evidence of mitral valve stenosis. MV peak gradient, 4.1 mmHg. The mean mitral valve gradient is 1.0 mmHg. Tricuspid Valve: The tricuspid valve is normal in structure. Tricuspid valve regurgitation is not demonstrated. No evidence of tricuspid stenosis. Aortic Valve: The aortic valve is tricuspid. Aortic valve regurgitation is not visualized. No aortic stenosis is present. Aortic valve mean gradient measures 3.0 mmHg. Aortic valve peak gradient measures 6.9 mmHg. Aortic valve area, by VTI measures 2.43 cm. Pulmonic Valve: The pulmonic valve was normal in structure. Pulmonic valve regurgitation is not visualized. No evidence of pulmonic stenosis. Aorta: The aortic root is normal in size and structure. Venous: The inferior vena cava is dilated in size with greater than 50% respiratory variability, suggesting right atrial pressure of 8 mmHg. IAS/Shunts: No atrial level shunt detected by color flow Doppler.  LEFT VENTRICLE PLAX 2D LVIDd:         4.90 cm     Diastology LVIDs:         3.40 cm     LV e' medial:    11.50 cm/s LV PW:         0.60 cm     LV E/e' medial:  7.9 LV IVS:        0.70 cm     LV e' lateral:   12.80 cm/s LVOT diam:     2.00 cm     LV E/e' lateral: 7.1 LV SV:         73 LV SV Index:   40 LVOT Area:     3.14 cm  LV Volumes (MOD) LV vol d, MOD A2C: 99.2 ml LV vol d, MOD A4C: 96.4 ml LV vol s, MOD A2C: 38.1 ml LV vol s, MOD A4C: 41.7 ml LV SV MOD A2C:     61.1 ml LV SV MOD A4C:     96.4 ml LV SV MOD BP:      58.1 ml RIGHT VENTRICLE             IVC RV Basal diam:  3.00 cm     IVC diam: 2.10 cm RV S prime:     10.10 cm/s TAPSE (M-mode): 2.0 cm LEFT ATRIUM             Index        RIGHT ATRIUM           Index LA diam:        2.70 cm 1.49 cm/m   RA  Area:     11.30 cm LA Vol (A2C):   45.1 ml 24.85 ml/m  RA Volume:   24.20 ml  13.33 ml/m LA Vol (A4C):   33.2 ml 18.29 ml/m LA Biplane Vol: 39.3 ml 21.65 ml/m  AORTIC VALVE                    PULMONIC VALVE AV Area (Vmax):    2.57 cm     PV Vmax:          0.97 m/s AV Area (Vmean):   2.25 cm     PV Peak grad:     3.8 mmHg AV Area (VTI):     2.43 cm     PR End Diast Vel: 1.00 msec AV Vmax:           131.00 cm/s AV Vmean:          86.800 cm/s AV VTI:            0.301 m AV Peak Grad:      6.9 mmHg AV Mean Grad:      3.0 mmHg LVOT Vmax:         107.00 cm/s LVOT Vmean:        62.100 cm/s LVOT VTI:          0.233 m LVOT/AV VTI ratio: 0.77  AORTA Ao Root diam: 2.80 cm Ao Asc diam:  3.00 cm MITRAL VALVE MV Area (PHT): 2.82 cm    SHUNTS MV Area VTI:   2.16 cm    Systemic VTI:  0.23 m MV Peak grad:  4.1 mmHg    Systemic Diam: 2.00 cm MV Mean grad:  1.0 mmHg MV Vmax:       1.01 m/s MV Vmean:      56.5 cm/s MV Decel Time: 269 msec MR Peak grad: 19.9 mmHg MR Vmax:      223.00 cm/s MV E velocity: 90.30 cm/s MV A velocity: 73.30 cm/s MV E/A ratio:  1.23 Morene Brownie Electronically signed by Morene Brownie Signature Date/Time: 12/29/2023/1:30:42 PM    Final     Vitals:   12/29/23 2200 12/29/23 2300 12/29/23 2321 12/30/23 0724  BP: (!) 105/59 113/71 135/81 138/76  Pulse: 72 63 61 (!) 54  Resp: (!) 29 18 18 17   Temp:   98.4 F (36.9 C) 97.8 F (36.6 C)  TempSrc:   Oral Oral  SpO2: 95% 95% 100% 99%  Weight:      Height:       PHYSICAL EXAM General:  Alert, patient laying down in bed in NAD Psych:  Mood and affect appropriate for situation CV: Regular rate and rhythm on monitor Respiratory:  Regular, unlabored respirations on room air  NEURO:  Mental Status: AA&Ox3, patient is able to give clear and coherent history Speech/Language: speech is with mild dysarthria or aphasia.  Naming, repetition, fluency, and comprehension intact.  Cranial Nerves:  II: PERRL. Visual fields full.  III, IV, VI:  EOMI. Eyelids elevate symmetrically.  V: Sensation is intact to light touch and symmetrical to face.  VII: Face is symmetrical resting and smiling VIII: hearing intact to voice. IX, X: Palate elevates symmetrically. Phonation is normal.  KP:Dynloizm shrug 5/5. XII: tongue is midline without fasciculations. Motor: 5/5 strength to all muscle groups tested.  Tone: is normal and bulk is normal Sensation- Intact to light touch bilaterally. Extinction absent to light touch to DSS.   Coordination: FTN intact bilaterally, HKS: no ataxia in BLE.No drift.  Gait- deferred  Most Recent NIH 1   ASSESSMENT/PLAN  [name] is a 53 y.o. female with history of  hyperlipidemia, cocaine use, hypothyroidism, osteoporosis, daily smoking, and tardive dyskinesia admitted for severe dysarthria and left-sided weakness.  Now status post TNK, left-sided weakness has completely resolved and still with some mild dysarthria.  NIH on Admission 6    Stroke: left MCA small cortical infarcts s/p TNK, etiology likely from atherosclerosis due to uncontrolled risk factors with smoking and cocaine use vs. Cardioembolic source given cortical location  Code Stroke, CT head with no acute abnormality. ASPECTS 10.    CTA head & neck no LVO, curvilinear defect in right ICA, minimal atherosclerotic calcifications within cavernous internal carotid arteries bilaterally MRI pending 2D Echo EF 60-65% LE venous doppler no evidence of DVT bilaterally TCD bubble study negative TEE tomorrow Will consider 30 day monitoring if work up unrevealing LDL 120 HgbA1c 5.0 UDS + cocaine and benzo VTE prophylaxis -SCDs No antithrombotic prior to admission, now on aspirin 81 mg and plavix daily for 3 weeks and then ASA alone. Therapy recommendations: none  Disposition: Pending  Hx of Stroke/TIA Son reports 03/2022 admitted OSH for left facial droop and left sided weakness with confusion that lasted a week but apparently MRI  negative.  Patient reports her PCP referred to this as a mini stroke.   Hyperlipidemia Home meds: None LDL 120, goal < 70 Add rosuvastatin 20 mg daily Continue statin at discharge   Tobacco Abuse Patient smokes 1/2 pack per day for 20 years      Ready to quit? No Nicotine  replacement therapy provided   Cocaine Abuse Patient uses cocaine UDS positive for cocaine       Ready to quit? yes TOC consult for cessation placed   Other Stroke Risk Factors None   Other Active Problems Hypothyroidism-continue home Synthroid  MDD -continue home Effexor and Celexa  GAD -continue home Xanax  Tardive dyskinesia - followed with Dr. Evonnie at [facility] Hx of pseudoseizure  Hospital day # 2    ATTENDING NOTE: I reviewed above note and agree with the assessment and plan. Pt was seen and examined.   Sister at bedside.  Patient neuro much improved, currently at baseline.  Neuro intact.  However, MRI did show left MCA several small scattered infarct, will need further stroke workup including TEE.  Currently no DVT or PFO found.  Continue DAPT and statin.  Continue aggressive stroke risk factor modification including quit smoking and quit cocaine.  PT and OT no recommendation.  For detailed assessment and plan, please refer to above as I have made changes wherever appropriate.   Ary Cummins, MD PhD Stroke Neurology 12/30/2023 6:16 PM    To contact Stroke Continuity provider, please refer to [email or web address]. After hours, contact General Neurology

## 2023-12-30 NOTE — Progress Notes (Addendum)
 STROKE TEAM PROGRESS NOTE   INTERIM HISTORY/SUBJECTIVE Patient reports that she feels like she is back to her baseline and would like to go home today.  We discussed she would need some additional testing and scheduled her for TEE tomorrow.  Patient voiced understanding of this.  Her sister is present in the room with her today, and she has no questions. No other concerns at this time.  OBJECTIVE  CBC    Component Value Date/Time   WBC 6.2 12/28/2023 1458   RBC 4.22 12/28/2023 1458   HGB 13.7 12/28/2023 1458   HCT 40.0 12/28/2023 1458   PLT 197 12/28/2023 1458   MCV 94.8 12/28/2023 1458   MCH 32.5 12/28/2023 1458   MCHC 34.3 12/28/2023 1458   RDW 13.1 12/28/2023 1458   LYMPHSABS 2.2 12/28/2023 1530   MONOABS 0.5 12/28/2023 1530   EOSABS 0.2 12/28/2023 1530   BASOSABS 0.0 12/28/2023 1530    BMET    Component Value Date/Time   NA 136 12/28/2023 1458   K 3.0 (L) 12/28/2023 1458   CL 104 12/28/2023 1458   CO2 26 12/28/2023 1458   GLUCOSE 83 12/28/2023 1458   BUN <5 (L) 12/28/2023 1458   CREATININE 0.93 12/28/2023 1458   CALCIUM  8.3 (L) 12/28/2023 1458   GFRNONAA >60 12/28/2023 1458    IMAGING past 24 hours MR ANGIO NECK W WO CONTRAST Result Date: 12/29/2023 CLINICAL DATA:  Initial evaluation for stroke, right ICA web versus dissection. EXAM: MRA NECK WITHOUT AND WITH CONTRAST TECHNIQUE: Multiplanar and multiecho pulse sequences of the neck were obtained without and with intravenous contrast. Angiographic images of the neck were obtained using MRA technique without and with intravenous contrast. CONTRAST:  7mL GADAVIST  GADOBUTROL  1 MMOL/ML IV SOLN COMPARISON:  CTA from earlier the same day. FINDINGS: AORTIC ARCH: Examination is technically limited as no axial T1 fat-sat dissection sequence was performed, limiting assessment for possible subtle dissection. Visualized aortic arch within normal limits for caliber with standard branch pattern. No stenosis about the origin the great  vessels. RIGHT CAROTID SYSTEM: Right common and internal carotid arteries are patent with antegrade flow. Previously identified subtle curvilinear linear defect traversing the proximal right ICA at the right carotid bulb again seen (series [id number], image 81), stable from prior CTA. No visible significant intimal irregularity. No filling defect to suggest thrombus. 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 No associated stenosis. Right ICA with the patent distally. LEFT CAROTID SYSTEM: Patent antegrade flow within the left common and internal carotid arteries. No evidence for dissection. Atheromatous irregularity about the mid left CCA with mild 25-30% narrowing (series [id number], image 13). No other hemodynamically significant stenosis about the left carotid artery system. VERTEBRAL ARTERIES: Both vertebral arteries arise from subclavian arteries. No proximal subclavian artery stenosis. Short-segment mild-to-moderate stenosis noted involving the proximal left V2 segment (series [id number], image 11). Vertebral arteries otherwise widely patent with antegrade flow. No dissection. Other: None. IMPRESSION: 1. Stable appearance of subtle curvilinear defect traversing the proximal right ICA at the right carotid bulb. 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 No associated stenosis. A short interval follow-up CTA and/or MRA in 3-6 months is suggested for further  evaluation and to evaluate the stability of this finding. 2. Atheromatous irregularity about the mid left CCA with associated mild 25-30% stenosis. 3. Short-segment  mild-to-moderate stenosis involving the proximal left V2 segment. Electronically Signed   By: Morene Hoard M.D.   On: 12/29/2023 19:12   MR BRAIN WO CONTRAST Result Date: 12/29/2023 CLINICAL DATA:  Initial evaluation for acute neuro deficit, stroke suspected. EXAM: MRI HEAD WITHOUT CONTRAST TECHNIQUE: Multiplanar, multiecho pulse sequences of the brain and surrounding structures were obtained without intravenous contrast. COMPARISON:  Prior study from 12/28/2023. FINDINGS: Brain: Cerebral volume within normal limits. Few scattered patchy foci of T2/FLAIR signal abnormality seen involving the periventricular and deep white matter, nonspecific, but favored to reflect changes of mild chronic microvascular ischemic disease. Small volume patchy restricted diffusion seen involving the cortex of the left temporal occipital junction (series 2, images 29, 27). No associated hemorrhage or mass effect. No other evidence for acute or subacute ischemia. Gray-white matter differentiation otherwise maintained. No other areas of chronic cortical infarction. No acute or chronic intracranial blood products. No mass lesion, midline shift or mass effect. No hydrocephalus or extra-axial fluid collection. Pituitary gland within normal limits. Vascular: Major intracranial vascular flow voids are maintained. Skull and upper cervical spine: Craniocervical junction within normal limits. Bone marrow signal intensity within normal limits. No scalp soft tissue abnormality. Sinuses/Orbits: Globes orbital soft tissues demonstrate no acute finding. Paranasal sinuses are largely clear. No mastoid effusion. Other: None. IMPRESSION: 1. Small patchy acute ischemic nonhemorrhagic infarct involving the cortex of the left temporoccipital junction. 2. Underlying mild chronic microvascular ischemic disease. Electronically Signed   By: Morene Hoard M.D.   On: 12/29/2023 18:56   ECHOCARDIOGRAM COMPLETE Result Date: 12/29/2023     ECHOCARDIOGRAM REPORT   Patient Name:   [name] Date of Exam: 12/29/2023 Medical Rec #:  [id number]         Height:       69.5 in Accession #:    [id number]        Weight:       145.7 lb Date of Birth:  1970/08/17         BSA:          1.815 m Patient Age:    53 years          BP:           108/63 mmHg Patient Gender: F                 HR:           63 bpm. Exam Location:  Inpatient Procedure: 2D Echo, Cardiac Doppler and Color Doppler (Both Spectral and Color            Flow Doppler were utilized during procedure). Indications:    Stroke I63.9  History:        Patient has no prior history of Echocardiogram examinations.                 Signs/Symptoms:Fatigue. H/O Cocaine use, Hyperlipidemia.  Sonographer:    BERNARDA ROCKS Referring Phys: [id number] [facility] KHALIQDINA IMPRESSIONS  1. Left ventricular ejection fraction, by estimation, is 60 to 65%. The left ventricle has normal function. The left ventricle has no regional wall motion abnormalities. Left ventricular diastolic parameters were normal.  2. Right ventricular systolic function is normal. The right ventricular size is normal.  3. The mitral valve is normal in structure. Trivial mitral valve regurgitation. No evidence of mitral stenosis.  4.  The aortic valve is tricuspid. Aortic valve regurgitation is not visualized. No aortic stenosis is present.  5. The inferior vena cava is dilated in size with >50% respiratory variability, suggesting right atrial pressure of 8 mmHg. FINDINGS  Left Ventricle: Left ventricular ejection fraction, by estimation, is 60 to 65%. The left ventricle has normal function. The left ventricle has no regional wall motion abnormalities. The left ventricular internal cavity size was normal in size. There is  no left ventricular hypertrophy. Left ventricular diastolic parameters were normal. Right Ventricle: The right ventricular size is normal. No increase in right ventricular wall thickness. Right ventricular systolic function is  normal. Left Atrium: Left atrial size was normal in size. Right Atrium: Right atrial size was normal in size. Pericardium: There is no evidence of pericardial effusion. Mitral Valve: The mitral valve is normal in structure. Trivial mitral valve regurgitation. No evidence of mitral valve stenosis. MV peak gradient, 4.1 mmHg. The mean mitral valve gradient is 1.0 mmHg. Tricuspid Valve: The tricuspid valve is normal in structure. Tricuspid valve regurgitation is not demonstrated. No evidence of tricuspid stenosis. Aortic Valve: The aortic valve is tricuspid. Aortic valve regurgitation is not visualized. No aortic stenosis is present. Aortic valve mean gradient measures 3.0 mmHg. Aortic valve peak gradient measures 6.9 mmHg. Aortic valve area, by VTI measures 2.43 cm. Pulmonic Valve: The pulmonic valve was normal in structure. Pulmonic valve regurgitation is not visualized. No evidence of pulmonic stenosis. Aorta: The aortic root is normal in size and structure. Venous: The inferior vena cava is dilated in size with greater than 50% respiratory variability, suggesting right atrial pressure of 8 mmHg. IAS/Shunts: No atrial level shunt detected by color flow Doppler.  LEFT VENTRICLE PLAX 2D LVIDd:         4.90 cm     Diastology LVIDs:         3.40 cm     LV e' medial:    11.50 cm/s LV PW:         0.60 cm     LV E/e' medial:  7.9 LV IVS:        0.70 cm     LV e' lateral:   12.80 cm/s LVOT diam:     2.00 cm     LV E/e' lateral: 7.1 LV SV:         73 LV SV Index:   40 LVOT Area:     3.14 cm  LV Volumes (MOD) LV vol d, MOD A2C: 99.2 ml LV vol d, MOD A4C: 96.4 ml LV vol s, MOD A2C: 38.1 ml LV vol s, MOD A4C: 41.7 ml LV SV MOD A2C:     61.1 ml LV SV MOD A4C:     96.4 ml LV SV MOD BP:      58.1 ml RIGHT VENTRICLE             IVC RV Basal diam:  3.00 cm     IVC diam: 2.10 cm RV S prime:     10.10 cm/s TAPSE (M-mode): 2.0 cm LEFT ATRIUM             Index        RIGHT ATRIUM           Index LA diam:        2.70 cm 1.49 cm/m   RA  Area:     11.30 cm LA Vol (A2C):   45.1 ml 24.85 ml/m  RA Volume:   24.20 ml  13.33 ml/m LA Vol (A4C):   33.2 ml 18.29 ml/m LA Biplane Vol: 39.3 ml 21.65 ml/m  AORTIC VALVE                    PULMONIC VALVE AV Area (Vmax):    2.57 cm     PV Vmax:          0.97 m/s AV Area (Vmean):   2.25 cm     PV Peak grad:     3.8 mmHg AV Area (VTI):     2.43 cm     PR End Diast Vel: 1.00 msec AV Vmax:           131.00 cm/s AV Vmean:          86.800 cm/s AV VTI:            0.301 m AV Peak Grad:      6.9 mmHg AV Mean Grad:      3.0 mmHg LVOT Vmax:         107.00 cm/s LVOT Vmean:        62.100 cm/s LVOT VTI:          0.233 m LVOT/AV VTI ratio: 0.77  AORTA Ao Root diam: 2.80 cm Ao Asc diam:  3.00 cm MITRAL VALVE MV Area (PHT): 2.82 cm    SHUNTS MV Area VTI:   2.16 cm    Systemic VTI:  0.23 m MV Peak grad:  4.1 mmHg    Systemic Diam: 2.00 cm MV Mean grad:  1.0 mmHg MV Vmax:       1.01 m/s MV Vmean:      56.5 cm/s MV Decel Time: 269 msec MR Peak grad: 19.9 mmHg MR Vmax:      223.00 cm/s MV E velocity: 90.30 cm/s MV A velocity: 73.30 cm/s MV E/A ratio:  1.23 Morene Brownie Electronically signed by Morene Brownie Signature Date/Time: 12/29/2023/1:30:42 PM    Final     Vitals:   12/29/23 2200 12/29/23 2300 12/29/23 2321 12/30/23 0724  BP: (!) 105/59 113/71 135/81 138/76  Pulse: 72 63 61 (!) 54  Resp: (!) 29 18 18 17   Temp:   98.4 F (36.9 C) 97.8 F (36.6 C)  TempSrc:   Oral Oral  SpO2: 95% 95% 100% 99%  Weight:      Height:       PHYSICAL EXAM General:  Alert, patient laying down in bed in NAD Psych:  Mood and affect appropriate for situation CV: Regular rate and rhythm on monitor Respiratory:  Regular, unlabored respirations on room air  NEURO:  Mental Status: AA&Ox3, patient is able to give clear and coherent history Speech/Language: speech is with mild dysarthria or aphasia.  Naming, repetition, fluency, and comprehension intact.  Cranial Nerves:  II: PERRL. Visual fields full.  III, IV, VI:  EOMI. Eyelids elevate symmetrically.  V: Sensation is intact to light touch and symmetrical to face.  VII: Face is symmetrical resting and smiling VIII: hearing intact to voice. IX, X: Palate elevates symmetrically. Phonation is normal.  KP:Dynloizm shrug 5/5. XII: tongue is midline without fasciculations. Motor: 5/5 strength to all muscle groups tested.  Tone: is normal and bulk is normal Sensation- Intact to light touch bilaterally. Extinction absent to light touch to DSS.   Coordination: FTN intact bilaterally, HKS: no ataxia in BLE.No drift.  Gait- deferred  Most Recent NIH 1   ASSESSMENT/PLAN  [name] is a 53 y.o. female with history of  hyperlipidemia, cocaine use, hypothyroidism, osteoporosis, daily smoking, and tardive dyskinesia admitted for severe dysarthria and left-sided weakness.  Now status post TNK, left-sided weakness has completely resolved and still with some mild dysarthria.  NIH on Admission 6    Stroke: left MCA small cortical infarcts s/p TNK, etiology likely from atherosclerosis due to uncontrolled risk factors with smoking and cocaine use vs. Cardioembolic source given cortical location  Code Stroke, CT head with no acute abnormality. ASPECTS 10.    CTA head & neck no LVO, curvilinear defect in right ICA, minimal atherosclerotic calcifications within cavernous internal carotid arteries bilaterally MRI pending 2D Echo EF 60-65% LE venous doppler no evidence of DVT bilaterally TCD bubble study negative TEE tomorrow Will consider 30 day monitoring if work up unrevealing LDL 120 HgbA1c 5.0 UDS + cocaine and benzo VTE prophylaxis -SCDs No antithrombotic prior to admission, now on aspirin 81 mg and plavix daily for 3 weeks and then ASA alone. Therapy recommendations: none  Disposition: Pending  Hx of Stroke/TIA Son reports 03/2022 admitted OSH for left facial droop and left sided weakness with confusion that lasted a week but apparently MRI  negative.  Patient reports her PCP referred to this as a mini stroke.   Hyperlipidemia Home meds: None LDL 120, goal < 70 Add rosuvastatin 20 mg daily Continue statin at discharge   Tobacco Abuse Patient smokes 1/2 pack per day for 20 years      Ready to quit? No Nicotine  replacement therapy provided   Cocaine Abuse Patient uses cocaine UDS positive for cocaine       Ready to quit? yes TOC consult for cessation placed   Other Stroke Risk Factors None   Other Active Problems Hypothyroidism-continue home Synthroid  MDD -continue home Effexor and Celexa  GAD -continue home Xanax  Tardive dyskinesia - followed with Dr. Evonnie at [facility] Hx of pseudoseizure  Hospital day # 2    ATTENDING NOTE: I reviewed above note and agree with the assessment and plan. Pt was seen and examined.   Sister at bedside.  Patient neuro much improved, currently at baseline.  Neuro intact.  However, MRI did show left MCA several small scattered infarct, will need further stroke workup including TEE.  Currently no DVT or PFO found.  Continue DAPT and statin.  Continue aggressive stroke risk factor modification including quit smoking and quit cocaine.  PT and OT no recommendation.  For detailed assessment and plan, please refer to above as I have made changes wherever appropriate.   Ary Cummins, MD PhD Stroke Neurology 12/30/2023 6:16 PM    To contact Stroke Continuity provider, please refer to [email or web address]. After hours, contact General Neurology

## 2023-12-30 NOTE — Plan of Care (Signed)

## 2023-12-30 NOTE — Progress Notes (Signed)
 Speech Language Pathology  Patient Details Name: [name] MRN: [id number] DOB: [birth year] Today's Date: 12/30/2023 Time:  -     Pt out of room for procedure. Will continue efforts.   Olam Bull [place].Ed Architect [phone]

## 2023-12-30 NOTE — Evaluation (Signed)
 Speech Language Pathology Evaluation Patient Details Name: [name] MRN: [id number] DOB: 1970/12/18 Today's Date: 12/30/2023 Time: 8583-8571 SLP Time Calculation (min) (ACUTE ONLY): 12 min  Problem List:  Patient Active Problem List   Diagnosis Date Noted   Tobacco use disorder 12/30/2023   Cocaine abuse (HCC) 12/30/2023   Acute ischemic stroke (HCC) 12/28/2023   Stroke determined by clinical assessment (HCC) 12/28/2023   Postsurgical hypothyroidism 08/05/2022   Bilateral carpal tunnel syndrome 03/03/2019   Follicular neoplasm of thyroid  03/14/2014   Past Medical History:  Past Medical History:  Diagnosis Date   Anxiety    Chronic back pain    Chronic neck pain    Complication of anesthesia    pt states her O2 saturation drops after surgery   GERD (gastroesophageal reflux disease)    Headache    migraines- last one 2 months ago   Hypercholesteremia    Hypothyroidism    Kidney stones    Osteoporosis    Reflex sympathetic dystrophy    Tardive dyskinesia    Thyroid  disease    Past Surgical History:  Past Surgical History:  Procedure Laterality Date   ABDOMINAL HYSTERECTOMY     partial   APPENDECTOMY     BACK SURGERY     plates and screws   BREAST SURGERY Left    fibrocystic tumor   CARPAL TUNNEL RELEASE Right 03/22/2019   Procedure: RIGHT CARPAL TUNNEL RELEASE;  Surgeon: Murrell Drivers, MD;  Location: [facility];  Service: Orthopedics;  Laterality: Right;  NO LOCAL   CHOLECYSTECTOMY     CYST EXCISION Left    foot   EAR CYST EXCISION Right 01/27/2013   Procedure: EXCISION CYST ON SCALP ;  Surgeon: Oneil DELENA Budge, MD;  Location: [facility];  Service: General;  Laterality: Right;   EAR CYST EXCISION Right 01/27/2013   Procedure: CYST REMOVAL right axilla;  Surgeon: Oneil DELENA Budge, MD;  Location: [facility];  Service: General;  Laterality: Right;   KNEE ARTHROSCOPY Left    NECK SURGERY     plate in neck   SALPINGOOPHORECTOMY Right    STRABISMUS  SURGERY Bilateral    THYROIDECTOMY N/A 03/14/2014   Procedure: TOTAL THYROIDECTOMY;  Surgeon: Oneil DELENA Budge, MD;  Location: [facility];  Service: General;  Laterality: N/A;   HPI:  53 yo F adm 9/28 dysarthria and mild L sided weakness.  Sp TNK, MRI Small patchy acute ischemic nonhemorrhagic infarct involving the cortex of the left temporoccipital junction, underlying mild chronic microvascular ischemic disease. PMH includes: Anxiety, Chronic back pain, GERD, Hypercholesteremia, Hypothyroidism, Reflex sympathetic dystrophy, Tardive dyskinesia.   Assessment / Plan / Recommendation Clinical Impression  Pt currently not working on disability, lives alone and feels that her cognition is baseline but speech is slightly slurred at times. Her language is appropriate. She performed within normal limits on cognitive tasks. She feels her memory is not good at times (forgetting what she goes into another room to retrieve ) and states she doesn't forget appointments and uses calendar and makes lists. Her speech is intelligible with one instance of imprecision. She does not need ST services however ST educated on strategies with pt verbalizing understanding and agreeing with recommendations.    SLP Assessment  SLP Recommendation/Assessment: Patient does not need any further Speech Language Pathology Services SLP Visit Diagnosis: Cognitive communication deficit (R41.841)     Assistance Recommended at Discharge     Functional Status Assessment Patient has not had a recent decline in their  functional status  Frequency and Duration           SLP Evaluation Cognition  Overall Cognitive Status: Within Functional Limits for tasks assessed Arousal/Alertness: Awake/alert Orientation Level: Oriented X4 Year: 2025 Month: September Day of Week: Correct Attention: Sustained Sustained Attention: Appears intact Memory:  (WNL on 4 word- 3/4 independent, 1/4 correct with choice) Awareness: Appears intact Problem  Solving: Appears intact Safety/Judgment: Appears intact       Comprehension  Auditory Comprehension Overall Auditory Comprehension: Appears within functional limits for tasks assessed Visual Recognition/Discrimination Discrimination: Not tested Reading Comprehension Reading Status: Not tested    Expression Expression Primary Mode of Expression: Verbal Verbal Expression Overall Verbal Expression: Appears within functional limits for tasks assessed Initiation: No impairment Level of Generative/Spontaneous Verbalization: Conversation Repetition:  (NT) Naming:  (no difficulty in conversation) Pragmatics: No impairment Written Expression Written Expression: Not tested   Oral / Motor  Oral Motor/Sensory Function Overall Oral Motor/Sensory Function: Within functional limits Motor Speech Overall Motor Speech: Impaired (trace, is functional) Respiration: Within functional limits Phonation: Normal Resonance: Within functional limits Articulation: Impaired Level of Impairment: Conversation Intelligibility: Intelligible Motor Planning: Within functional limits Motor Speech Errors: Not applicable            Dustin Olam Bull 12/30/2023, 2:47 PM

## 2023-12-30 NOTE — TOC Initial Note (Signed)
 Transition of Care [facility]) - Initial/Assessment Note    Patient Details  Name: [name] MRN: [id number] Date of Birth: [birth year]  [facility]) CM/SW Contact:    [name], RN Phone Number: 12/30/2023, 11:52 AM  Clinical Narrative:                  [name] is a 53 y.o. female with hx of cocaine use,HLD, hypothyroidism, osteoporosis, current everyday smoker who presents with significant slurred speech and some left-sided weakness.   Pt lives home alone but states her kids will help out if needed.  Children provide most of her transportation. She denies issues with home medications.  Pt has a PCP: GORMAN Mace.  No follow up per therapies.  IP Care management following.  Expected Discharge Plan: Home/Self Care Barriers to Discharge: Continued Medical Work up   Patient Goals and CMS Choice            Expected Discharge Plan and Services   Discharge Planning Services: CM Consult   Living arrangements for the past 2 months: Single Family Home                                      Prior Living Arrangements/Services Living arrangements for the past 2 months: Single Family Home Lives with:: Self Patient language and need for interpreter reviewed:: Yes Do you feel safe going back to the place where you live?: Yes            Criminal Activity/Legal Involvement Pertinent to Current Situation/Hospitalization: No - Comment as needed  Activities of Daily Living      Permission Sought/Granted                  Emotional Assessment Appearance:: Appears stated age Attitude/Demeanor/Rapport: Engaged Affect (typically observed): Accepting Orientation: : Oriented to Self, Oriented to Place, Oriented to  Time, Oriented to Situation Alcohol / Substance Use: Illicit Drugs Psych Involvement: No (comment)  Admission diagnosis:  Acute ischemic stroke (HCC) [I63.9] Stroke determined by clinical assessment [facility]) [I63.9] Patient  Active Problem List   Diagnosis Date Noted   Acute ischemic stroke (HCC) 12/28/2023   Stroke determined by clinical assessment (HCC) 12/28/2023   Postsurgical hypothyroidism 08/05/2022   Bilateral carpal tunnel syndrome 03/03/2019   Follicular neoplasm of thyroid  03/14/2014   PCP:  Mace Lucie PARAS, PA-C Pharmacy:   [facility] [address] [place] - [address] [place] [address] Phone: [phone] Fax: [phone]  [facility] - [place], [place] - [address] [place] [address] Phone: [phone] Fax: [phone]     Social Drivers of Health (SDOH) Social History: SDOH Screenings   Food Insecurity: No Food Insecurity (12/28/2023)  Housing: Unknown (12/28/2023)  Transportation Needs: No Transportation Needs (12/28/2023)  Utilities: Not At Risk (12/28/2023)  Social Connections: Unknown (03/30/2022)   Received from [facility]  Tobacco Use: High Risk (12/28/2023)  Health Literacy: Low Risk  (11/02/2019)   Received from [facility]   SDOH Interventions:     Readmission Risk Interventions     No data to display

## 2023-12-30 NOTE — Progress Notes (Signed)
 TCD bubble study and BLE venous duplex have been completed.   Results can be found under chart review under CV PROC. 12/30/2023 12:31 PM Uyen Eichholz RVT, RDMS

## 2023-12-31 ENCOUNTER — Inpatient Hospital Stay (HOSPITAL_COMMUNITY)

## 2023-12-31 ENCOUNTER — Inpatient Hospital Stay (HOSPITAL_COMMUNITY): Admitting: Registered Nurse

## 2023-12-31 ENCOUNTER — Other Ambulatory Visit: Payer: Self-pay | Admitting: Cardiology

## 2023-12-31 ENCOUNTER — Encounter (HOSPITAL_COMMUNITY)
Admission: EM | Disposition: A | Discharge status: Home / Self Care | Payer: Self-pay | Source: Home / Self Care | Attending: Neurology

## 2023-12-31 DIAGNOSIS — R29701 NIHSS score 1: Secondary | ICD-10-CM | POA: Diagnosis not present

## 2023-12-31 DIAGNOSIS — I63432 Cerebral infarction due to embolism of left posterior cerebral artery: Secondary | ICD-10-CM | POA: Diagnosis not present

## 2023-12-31 DIAGNOSIS — I639 Cerebral infarction, unspecified: Secondary | ICD-10-CM

## 2023-12-31 DIAGNOSIS — E039 Hypothyroidism, unspecified: Secondary | ICD-10-CM

## 2023-12-31 DIAGNOSIS — F419 Anxiety disorder, unspecified: Secondary | ICD-10-CM

## 2023-12-31 HISTORY — PX: TRANSESOPHAGEAL ECHOCARDIOGRAM (CATH LAB): EP1270

## 2023-12-31 LAB — BASIC METABOLIC PANEL WITH GFR
Anion gap: 7 (ref 5–15)
BUN: 14 mg/dL (ref 6–20)
CO2: 30 mmol/L (ref 22–32)
Calcium: 8.1 mg/dL — ABNORMAL LOW (ref 8.9–10.3)
Chloride: 103 mmol/L (ref 98–111)
Creatinine, Ser: 0.87 mg/dL (ref 0.44–1.00)
GFR, Estimated: >60 mL/min (ref >60)
Glucose, Bld: 91 mg/dL (ref 70–99)
Potassium: 3.3 mmol/L — ABNORMAL LOW (ref 3.5–5.1)
Sodium: 140 mmol/L (ref 135–145)

## 2023-12-31 LAB — POCT I-STAT, CHEM 8
BUN: 15 mg/dL (ref 6–20)
Calcium, Ion: 1.19 mmol/L (ref 1.15–1.40)
Chloride: 99 mmol/L (ref 98–111)
Creatinine, Ser: 1 mg/dL (ref 0.44–1.00)
Glucose, Bld: 85 mg/dL (ref 70–99)
HCT: 34 % — ABNORMAL LOW (ref 36.0–46.0)
Hemoglobin: 11.6 g/dL — ABNORMAL LOW (ref 12.0–15.0)
Potassium: 3.4 mmol/L — ABNORMAL LOW (ref 3.5–5.1)
Sodium: 141 mmol/L (ref 135–145)
TCO2: 30 mmol/L (ref 22–32)

## 2023-12-31 LAB — ECHO TEE

## 2023-12-31 SURGERY — TRANSESOPHAGEAL ECHOCARDIOGRAM (TEE) (CATHLAB)
Anesthesia: Monitor Anesthesia Care

## 2023-12-31 MED ORDER — ROSUVASTATIN CALCIUM 20 MG PO TABS: 20.0000 mg | ORAL_TABLET | Freq: Every day | ORAL | 0 refills | Status: DC

## 2023-12-31 MED ORDER — PHENYLEPHRINE 80 MCG/ML (10ML) SYRINGE FOR IV PUSH (FOR BLOOD PRESSURE SUPPORT)
PREFILLED_SYRINGE | INTRAVENOUS | Status: DC | PRN
  Administered 2023-12-31 (×2): 120 ug via INTRAVENOUS

## 2023-12-31 MED ORDER — ASPIRIN 81 MG PO TBEC: 81.0000 mg | DELAYED_RELEASE_TABLET | Freq: Every day | ORAL | 12 refills | Status: DC

## 2023-12-31 MED ORDER — PROPOFOL 10 MG/ML IV BOLUS
INTRAVENOUS | Status: DC | PRN
  Administered 2023-12-31 (×2): 20 mg via INTRAVENOUS
  Administered 2023-12-31: 50 mg via INTRAVENOUS

## 2023-12-31 MED ORDER — PROPOFOL 500 MG/50ML IV EMUL
INTRAVENOUS | Status: DC | PRN
  Administered 2023-12-31: 120 ug/kg/min via INTRAVENOUS

## 2023-12-31 MED ORDER — CLOPIDOGREL BISULFATE 75 MG PO TABS: 75.0000 mg | ORAL_TABLET | Freq: Every day | ORAL | 0 refills | Status: DC

## 2023-12-31 MED ORDER — NICOTINE 21 MG/24HR TD PT24: 21.0000 mg | MEDICATED_PATCH | Freq: Every day | TRANSDERMAL | 0 refills | Status: DC

## 2023-12-31 MED ORDER — LIDOCAINE 2% (20 MG/ML) 5 ML SYRINGE
INTRAMUSCULAR | Status: DC | PRN
  Administered 2023-12-31: 100 mg via INTRAVENOUS

## 2023-12-31 NOTE — CV Procedure (Addendum)
   Transesophageal Echocardiogram  Indications: Stroke  Time out performed  During this procedure the patient was administered propofol  under anesthesiology supervision to achieve and maintain moderate sedation.  The patient's heart rate, blood pressure, and oxygen saturation are monitored continuously during the procedure.   Findings:  Left Ventricle: LVEF 45 to 50% mildly reduced  Mitral Valve: Normal  Aortic Valve: Trileaflet normal  Tricuspid Valve: Mild tricuspid regurgitation  Left Atrium: Normal, no left atrial appendage thrombus  Right Atrium: Normal  Intraatrial septum: Very small PFO on color-flow Doppler  Bubble Contrast Study: Late bubbles noted very small amount, during Valsalva maneuver, compatible with very small PFO, patent foramen ovale  No masses, no thrombus  Oneil Parchment, MD

## 2023-12-31 NOTE — Progress Notes (Signed)
 Ordering 30 day monitor, per neurology, post-stroke, Dr. Anner to read

## 2023-12-31 NOTE — Progress Notes (Signed)
Discharge instructions provided to patient. All medications, follow up appointments, and discharge instructions provided. IV out. Monitor off CCMD notified. Discharging to home.

## 2023-12-31 NOTE — Anesthesia Preprocedure Evaluation (Signed)
 Anesthesia Evaluation  Patient identified by MRN, date of birth, ID band Patient awake    Reviewed: Allergy & Precautions, NPO status , Patient's Chart, lab work & pertinent test results  Airway Mallampati: II  TM Distance: >3 FB Neck ROM: Full    Dental  (+) Edentulous Upper, Edentulous Lower   Pulmonary Current Smoker and Patient abstained from smoking.   Pulmonary exam normal        Cardiovascular negative cardio ROS  Rhythm:Regular Rate:Normal     Neuro/Psych  Headaches  Anxiety     CVA, Residual Symptoms    GI/Hepatic Neg liver ROS,GERD  ,,  Endo/Other  Hypothyroidism    Renal/GU Renal disease  negative genitourinary   Musculoskeletal negative musculoskeletal ROS (+)    Abdominal Normal abdominal exam  (+)   Peds  Hematology Lab Results      Component                Value               Date                      WBC                      6.2                 12/28/2023                HGB                      13.7                12/28/2023                HCT                      40.0                12/28/2023                MCV                      94.8                12/28/2023                PLT                      197                 12/28/2023             Lab Results      Component                Value               Date                      NA                       136                 12/28/2023                K  3.0 (L)             12/28/2023                CO2                      26                  12/28/2023                GLUCOSE                  83                  12/28/2023                BUN                      <5 (L)              12/28/2023                CREATININE               0.93                12/28/2023                CALCIUM                   8.3 (L)             12/28/2023                GFRNONAA                 >60                 12/28/2023               Anesthesia Other Findings   Reproductive/Obstetrics                              Anesthesia Physical Anesthesia Plan  ASA: 3  Anesthesia Plan: MAC   Post-op Pain Management:    Induction: Intravenous  PONV Risk Score and Plan: 1 and Propofol  infusion and Treatment may vary due to age or medical condition  Airway Management Planned: Simple Face Mask and Nasal Cannula  Additional Equipment: None  Intra-op Plan:   Post-operative Plan:   Informed Consent: I have reviewed the patients History and Physical, chart, labs and discussed the procedure including the risks, benefits and alternatives for the proposed anesthesia with the patient or authorized representative who has indicated his/her understanding and acceptance.     Dental advisory given  Plan Discussed with: CRNA  Anesthesia Plan Comments:         Anesthesia Quick Evaluation

## 2023-12-31 NOTE — Interval H&P Note (Signed)
 History and Physical Interval Note:  12/31/2023 9:32 AM  [name]  has presented today for surgery, with the diagnosis of Stroke.  The various methods of treatment have been discussed with the patient and family. After consideration of risks, benefits and other options for treatment, the patient has consented to  Procedure(s): TRANSESOPHAGEAL ECHOCARDIOGRAM (N/A) as a surgical intervention.  The patient's history has been reviewed, patient examined, no change in status, stable for surgery.  I have reviewed the patient's chart and labs.  Questions were answered to the patient's satisfaction.     [employer]

## 2023-12-31 NOTE — TOC Transition Note (Signed)
 Transition of Care [facility]) - Discharge Note   Patient Details  Name: [name] MRN: [id number] Date of Birth: 25-Apr-1970  Transition of Care [facility] CM/SW Contact:  [name], RN Phone Number: 12/31/2023, 2:37 PM   Clinical Narrative:     Pt is discharging home with self care. No needs per IP Care management.  Final next level of care: Home/Self Care Barriers to Discharge: No Barriers Identified   Patient Goals and CMS Choice            Discharge Placement                       Discharge Plan and Services Additional resources added to the After Visit Summary for     Discharge Planning Services: CM Consult                                 Social Drivers of Health (SDOH) Interventions SDOH Screenings   Food Insecurity: No Food Insecurity (12/28/2023)  Housing: Unknown (12/28/2023)  Transportation Needs: No Transportation Needs (12/28/2023)  Utilities: Not At Risk (12/28/2023)  Social Connections: Unknown (03/30/2022)   Received from [facility]  Tobacco Use: High Risk (12/28/2023)  Health Literacy: Low Risk  (11/02/2019)   Received from [facility]     Readmission Risk Interventions     No data to display

## 2023-12-31 NOTE — Transfer of Care (Signed)
 Immediate Anesthesia Transfer of Care Note  Patient: [name]  Procedure(s) Performed: TRANSESOPHAGEAL ECHOCARDIOGRAM  Patient Location: Cath Lab  Anesthesia Type:MAC  Level of Consciousness: drowsy  Airway & Oxygen Therapy: Patient Spontanous Breathing  Post-op Assessment: Report given to RN and Post -op Vital signs reviewed and stable  Post vital signs: Reviewed and stable  Last Vitals:  Vitals Value Taken Time  BP 119/65 12/31/23 10:15  Temp 36.6 C 12/31/23 10:15  Pulse 50 12/31/23 10:16  Resp 15 12/31/23 10:16  SpO2 100 % 12/31/23 10:16  Vitals shown include unfiled device data.  Last Pain:  Vitals:   12/31/23 1015  TempSrc: Temporal  PainSc: Asleep         Complications: There were no known notable events for this encounter.

## 2023-12-31 NOTE — Anesthesia Postprocedure Evaluation (Signed)
 Anesthesia Post Note  Patient: [name]  Procedure(s) Performed: TRANSESOPHAGEAL ECHOCARDIOGRAM     Patient location during evaluation: PACU Anesthesia Type: MAC Level of consciousness: awake and alert Pain management: pain level controlled Vital Signs Assessment: post-procedure vital signs reviewed and stable Respiratory status: spontaneous breathing, nonlabored ventilation, respiratory function stable and patient connected to nasal cannula oxygen Cardiovascular status: stable and blood pressure returned to baseline Postop Assessment: no apparent nausea or vomiting Anesthetic complications: no   There were no known notable events for this encounter.  Last Vitals:  Vitals:   12/31/23 1043 12/31/23 1146  BP:  127/65  Pulse:  (!) 58  Resp:  18  Temp: 36.7 C 36.5 C  SpO2:  100%    Last Pain:  Vitals:   12/31/23 1043  TempSrc: Temporal  PainSc: 0-No pain                 Cordella P Guilianna Mckoy

## 2024-01-01 ENCOUNTER — Encounter (HOSPITAL_COMMUNITY): Payer: Self-pay | Admitting: Cardiology

## 2024-01-12 ENCOUNTER — Telehealth: Payer: Self-pay

## 2024-01-12 DIAGNOSIS — I639 Cerebral infarction, unspecified: Secondary | ICD-10-CM

## 2024-01-12 NOTE — Transitions of Care (Post Inpatient/ED Visit) (Signed)
 Stroke Discharge Follow-up   01/12/2024 Name:  [name] MRN:  [id number] DOB:  05-26-1970  Subjective: [name] is a 53 y.o. year old female who is a primary care patient of [name] An Emmi alert was received indicating patient responded to questions: Feeling worse overall? Sad, hopeless, anxious, or empty?. I reached out by phone to follow up on the alert. No answer.  Voice message left.   Care Management Interventions: Reviewed EMMI Dashboard, Attempted outreach to patient.  Follow up plan: Will attempt outreach the next business day.     Nakota Ackert J. Onia Shiflett RN, MSN [facility], [facility] RN Care Manager Direct Dial: [phone]  Fax: [phone] Website: [email or web address]

## 2024-01-13 ENCOUNTER — Telehealth: Payer: Self-pay

## 2024-01-13 NOTE — Transitions of Care (Post Inpatient/ED Visit) (Signed)
 Stroke Discharge Follow-up   01/13/2024 Name:  [name] MRN:  [id number] DOB:  02/12/1971  Subjective: [name] is a 53 y.o. year old female who is a primary care patient of [name] An Emmi alert was received indicating patient responded to questions: Feeling worse overall? Sad, hopeless, anxious, or empty?. I reached out by phone to follow up on the alert.  Unsuccessful outreach.    Care Management Interventions: Reviewed dashboard, Attempted outreach to patient.  Follow up plan: Will attempt outreach again the next business day.    [name] J. Adriene Knipfer RN, MSN [facility], [facility] RN Care Manager Direct Dial: [phone]  Fax: [phone] Website: [email or web address]

## 2024-01-14 ENCOUNTER — Telehealth: Payer: Self-pay

## 2024-01-14 NOTE — Transitions of Care (Post Inpatient/ED Visit) (Signed)
 Stroke Discharge Follow-up   01/14/2024 Name:  [name] MRN:  [id number] DOB:  12/08/1970  Subjective: [name] is a 53 y.o. year old female who is a primary care patient of [name] An Emmi alert was received indicating patient responded to questions: Feeling worse overall? Sad, hopeless, anxious, or empty?. I reached out by phone to follow up on the alert and spoke to Patient. Patient states she suffers with depression chronically.  PHQ-9 = 13. On medication therapy.  Denies thoughts on harming herself or others.  She states she is more scared about having another stroke and does not like being alone. Advised this is a normal feeling after a stroke.  Advised to follow up with PCP if it really affects her day to day functioning and interactions with other. She verbalized understanding.  She appreciates the call and reassurance.    Care Management Interventions: Reviewed dashboard, outreach to patient, addressed red flags. Discussed importance of physician follow up to address concerns after a stroke.    Follow up plan: No further intervention required.  Willman Cuny J. Mckinsey Keagle RN, MSN [facility], [facility] RN Care Manager Direct Dial: [phone]  Fax: [phone] Website: [email or web address]

## 2024-01-15 ENCOUNTER — Telehealth: Payer: Self-pay

## 2024-01-15 DIAGNOSIS — I639 Cerebral infarction, unspecified: Secondary | ICD-10-CM

## 2024-01-15 NOTE — Transitions of Care (Post Inpatient/ED Visit) (Signed)
 Stroke Discharge Follow-up   01/15/2024 Name:  FALINE LANGER MRN:  [id number] DOB:  Jun 27, 1970  Subjective: Alliene Klugh Truman is a 53 y.o. year old female who is a primary care patient of Jackson, Samantha J, PA-C An Emmi alert was received indicating patient responded to questions: Scheduled a follow-up appointment? Went to follow-up appointment?. I reached out by phone to follow up on the alert and spoke to Patient.  Care Management Interventions: Reviewed dashboard, Outreach to patient,  Discussed red alert. Patient did call PCP on yesterday.  She is waiting for call back from PCP office for follow up appointment.  She denies any further concerns.    Follow up plan: No further intervention required.   Alisabeth Selkirk J. Harace Mccluney RN, MSN [facility], [facility] RN Care Manager Direct Dial: [phone]  Fax: [phone] Website: [email or web address]

## 2024-02-12 ENCOUNTER — Ambulatory Visit: Attending: Cardiology

## 2024-02-12 DIAGNOSIS — I639 Cerebral infarction, unspecified: Secondary | ICD-10-CM

## 2024-02-24 NOTE — Progress Notes (Signed)
 Cardiology Office Note [facility]:    Date:  03/01/2024   ID:  [name], DOB [birth year], MRN [id number]  PCP:  [name]   [facility] Providers Cardiologist:  Gordy Bergamo, MD     Referring MD: [name], [name]*   Chief Complaint  Patient presents with   New Patient (Initial Visit)    Post CVA, CM    History of Present Illness:    [name] is a 53 y.o. female with a hx of tobacco abuse, cocaine abuse, tardive dyskinesia, HLD, and recent stroke.   She was hospitalized 12/2023 with left MCA small cortical infarcts s/p TNK. Etiology of stroke felt uncontrolled risk factors such as tobacco abuse vs cocaine abuse vs cardiomebolic source given cortical location. Echo 12/29/23 demonstrated LVEF 60 to 65%, normal RV size and function, trivial MR, no aortic stenosis.  Follow-up TEE demonstrated LVEF 45-50%, no LAA thrombus.  Heart monitor placed and showed no atrial fibrillation or flutter.  PVC burden was less than 1%.  Patient triggered symptoms primarily correlated with sinus rhythm.  She presents today for cardiology evaluation and follow-up of her heart monitor. Her heart monitor showed ectopy, PVCs with < 1% burden. She reports no palpitations.   She reports increased thirst, urination, and insomnia. She has also had an increase in her PTSD symptoms.    She has no family history of heart issues.  She does not work due to disability for back pain - has had a back surgery and 2 neck surgeries. 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   She lives at home at her older son. She is here with her youngest son. Youngest son states this was one due to sedentary lifestyle and depression.  She no longer smokes cigarettes but vapes.  She does walk her dog 15 min three-four times per day.  She is interested in silver sneakers and I think this is a good idea.   She does not smoke marijuana, she no longer uses  cocaine.    Past Medical History:  Diagnosis Date   Anxiety    Chronic back pain    Chronic neck pain    Complication of anesthesia    pt states her O2 saturation drops after surgery   GERD (gastroesophageal reflux disease)    Headache    migraines- last one 2 months ago   Hypercholesteremia    Hypothyroidism    Kidney stones    Osteoporosis    Reflex sympathetic dystrophy    Tardive dyskinesia    Thyroid  disease     Past Surgical History:  Procedure Laterality Date   ABDOMINAL HYSTERECTOMY     partial   APPENDECTOMY     BACK SURGERY     plates and screws   BREAST SURGERY Left    fibrocystic tumor   CARPAL TUNNEL RELEASE Right 03/22/2019   Procedure: RIGHT CARPAL TUNNEL RELEASE;  Surgeon: Murrell Drivers, MD;  Location:  [facility];  Service: Orthopedics;  Laterality: Right;  NO LOCAL   CHOLECYSTECTOMY     CYST EXCISION Left    foot   EAR CYST EXCISION Right 01/27/2013   Procedure: EXCISION CYST ON SCALP ;  Surgeon: Oneil DELENA Budge, MD;  Location: [facility];  Service: General;  Laterality: Right;   EAR CYST EXCISION Right 01/27/2013   Procedure: CYST REMOVAL right axilla;  Surgeon: Oneil DELENA Budge, MD;  Location: [facility];  Service: General;  Laterality: Right;   KNEE ARTHROSCOPY Left    NECK SURGERY     plate in neck   SALPINGOOPHORECTOMY Right    STRABISMUS SURGERY Bilateral    THYROIDECTOMY N/A 03/14/2014   Procedure: TOTAL THYROIDECTOMY;  Surgeon: Oneil DELENA Budge, MD;  Location: [facility];  Service: General;  Laterality: N/A;   TRANSESOPHAGEAL ECHOCARDIOGRAM (CATH LAB) N/A 12/31/2023   Procedure: TRANSESOPHAGEAL ECHOCARDIOGRAM;  Surgeon: Jeffrie Oneil BROCKS, MD;  Location: [facility];  Service: Cardiovascular;  Laterality: N/A;    Current Medications: Current Meds  Medication Sig   ALPRAZolam  (XANAX ) 1 MG tablet Take 1 mg by mouth 4 (four) times daily.   aspirin  EC 81 MG tablet Take 1 tablet (81 mg total) by mouth daily. Swallow whole.   baclofen   (LIORESAL ) 10 MG tablet Take 10 mg by mouth 3 (three) times daily as needed for muscle spasms.   citalopram  (CELEXA ) 40 MG tablet Take 40 mg by mouth daily.   levothyroxine  (SYNTHROID ) 75 MCG tablet Take 1 tablet (75 mcg total) by mouth daily before breakfast.   OVER THE COUNTER MEDICATION Take 1 tablet by mouth 2 (two) times daily. OTC sinus medication   rosuvastatin  (CRESTOR ) 20 MG tablet Take 1 tablet (20 mg total) by mouth daily.   spironolactone (ALDACTONE) 25 MG tablet Take 0.5 tablets (12.5 mg total) by mouth daily.   traZODone  (DESYREL ) 50 MG tablet Take 100 mg by mouth at bedtime.   venlafaxine  (EFFEXOR ) 37.5 MG tablet Take 37.5 mg by mouth 2 (two) times daily.     Allergies:   Darvocet [propoxyphene n-acetaminophen ] and Lyrica [pregabalin]   Social History   Socioeconomic History   Marital status: Widowed    Spouse name: Not on file   Number of children: Not on file   Years of education: Not on file   Highest education level: Not on file  Occupational History   Not on file  Tobacco Use   Smoking status: Every Day    Current packs/day: 0.50    Average packs/day: 0.5 packs/day for 20.0 years (10.0 ttl pk-yrs)    Types: Cigarettes   Smokeless tobacco: Never  Substance and Sexual Activity   Alcohol use: Yes    Comment: occasionally   Drug use: No    Comment: prescription Rx   Sexual activity: Never  Other Topics Concern   Not on file  Social History Narrative   Lives alone.   Social Drivers of Corporate Investment Banker Strain: Not on file  Food Insecurity: No Food Insecurity (12/28/2023)   Hunger Vital Sign    Worried About Running Out of Food in the Last Year: Never true    Ran Out of Food in the Last Year: Never true  Transportation Needs: No Transportation Needs (12/28/2023)   PRAPARE - Administrator, Civil Service (Medical): No    Lack of Transportation (Non-Medical): No  Physical Activity: Not on file  Stress: Not on file  Social Connections:  Not on file     Family History: The patient's family history includes Breast cancer in her mother; Depression in her sister; Parkinson's disease in her father.  ROS:   Please see the history of present illness.     All other systems reviewed and are negative.  EKGs/Labs/Other Studies Reviewed:    The following studies were reviewed today:       Recent Labs: 12/28/2023: ALT 13; Platelets 197 12/31/2023: BUN 15; Creatinine, Ser 1.00; Hemoglobin 11.6; Potassium 3.4; Sodium  141  Recent Lipid Panel    Component Value Date/Time   CHOL 167 12/29/2023 0520   TRIG 55 12/29/2023 0520   HDL 36 (L) 12/29/2023 0520   CHOLHDL 4.6 12/29/2023 0520   VLDL 11 12/29/2023 0520   LDLCALC 120 (H) 12/29/2023 0520     Risk Assessment/Calculations:                Physical Exam:    VS:  BP 110/70   Pulse 70   Ht 5' 9.5 (1.765 m)   Wt 174 lb 9.6 oz (79.2 kg)   SpO2 96%   BMI 25.41 kg/m     Wt Readings from Last 3 Encounters:  03/01/24 174 lb 9.6 oz (79.2 kg)  12/28/23 145 lb 11.6 oz (66.1 kg)  09/16/22 140 lb (63.5 kg)     GEN:  Well nourished, well developed in no acute distress HEENT: Normal NECK: No JVD; No carotid bruits LYMPHATICS: No lymphadenopathy CARDIAC: RRR, no murmurs, rubs, gallops RESPIRATORY:  Clear to auscultation without rales, wheezing or rhonchi  ABDOMEN: Soft, non-tender, non-distended MUSCULOSKELETAL:  No edema; No deformity  SKIN: Warm and dry NEUROLOGIC:  Alert and oriented x 3 PSYCHIATRIC:  Normal affect   ASSESSMENT:    1. Medication management   2. Stroke determined by clinical assessment (HCC)   3. Cocaine abuse (HCC)   4. Tobacco use disorder   5. HFrEF (heart failure with reduced ejection fraction) (HCC)   6. PVC (premature ventricular contraction)   7. Hyperlipidemia with target LDL less than 70   8. Depression, unspecified depression type    PLAN:    In order of problems listed above:  PVCs - No beta-blocker given cocaine use, not  bothered by palpitations - she has been abstinent from cocaine for 1 months - will give another 3 months, if still abstinent then will start BB   Mild cardiomyopathy - TTE with preserved LVEF, but TEE with LVEF 45-50% -- denies exertional chest pain -- no BB due to cocaine use -- she states she was a premature infant and has a bladder that is too small, she also is prone to yeast infections - no SGLT2i -- no BP room for ACEI/ARB/ARNI -- K was 3.4 -- will start low dose spironolactone 12.5 mg -- BMP in 1 week -- hold off on ischemic evaluation for now -- low sodium diet, daily weights -- much of this visit was education   History of stroke in the left MCA territory -Remains on aspirin  and Plavix  x 3 months then aspirin  monotherapy -- currently on ASA monotherapy   Hyperlipidemia with LDL less than 70 12/29/2023: Cholesterol 167; HDL 36; LDL Cholesterol 120; Triglycerides 55; VLDL 11 - Discharged on 20 mg Crestor    Depression - sedentary lifestyle - encouraged silver sneaker   Follow up in 2 months.            Medication Adjustments/Labs and Tests Ordered: Current medicines are reviewed at length with the patient today.  Concerns regarding medicines are outlined above.  Orders Placed This Encounter  Procedures   Basic Metabolic Panel (BMET)   Meds ordered this encounter  Medications   spironolactone (ALDACTONE) 25 MG tablet    Sig: Take 0.5 tablets (12.5 mg total) by mouth daily.    Dispense:  45 tablet    Refill:  0    Patient Instructions  Medication Instructions:  Your physician has recommended you make the following change in your medication:   START Spironolactone 25  mg taking 1/2 tablet = 12.5 mg daily  *If you need a refill on your cardiac medications before your next appointment, please call your pharmacy*  Lab Work: 1 WEEK, GO TO A [facility] FOR:  BMET  If you have labs (blood work) drawn today and your tests are completely normal, you will receive  your results only by: MyChart Message (if you have MyChart) OR A paper copy in the mail If you have any lab test that is abnormal or we need to change your treatment, we will call you to review the results.  Testing/Procedures: None ordered  Follow-Up: At [facility], you and your health needs are our priority.  As part of our continuing mission to provide you with exceptional heart care, our providers are all part of one team.  This team includes your primary Cardiologist (physician) and Advanced Practice Providers or APPs (Physician Assistants and Nurse Practitioners) who all work together to provide you with the care you need, when you need it.  Your next appointment:   2 month(s)  Provider:   Gordy Bergamo, MD    We recommend signing up for the patient portal called MyChart.  Sign up information is provided on this After Visit Summary.  MyChart is used to connect with patients for Virtual Visits (Telemedicine).  Patients are able to view lab/test results, encounter notes, upcoming appointments, etc.  Non-urgent messages can be sent to your provider as well.   To learn more about what you can do with MyChart, go to [email or web address].   Other Instructions DASH Eating Plan DASH stands for Dietary Approaches to Stop Hypertension. The DASH eating plan is a healthy eating plan that has been shown to: Lower high blood pressure (hypertension). Reduce your risk for type 2 diabetes, heart disease, and stroke. Help with weight loss. What are tips for following this plan? Reading food labels Check food labels for the amount of salt (sodium) per serving. Choose foods with less than 5 percent of the Daily Value (DV) of sodium. In general, foods with less than 300 milligrams (mg) of sodium per serving fit into this eating plan. To find whole grains, look for the word whole as the first word in the ingredient list. Shopping Buy products labeled as low-sodium or no salt  added. Buy fresh foods. Avoid canned foods and pre-made or frozen meals. Cooking Try not to add salt when you cook. Use salt-free seasonings or herbs instead of table salt or sea salt. Check with your health care provider or pharmacist before using salt substitutes. Do not fry foods. Cook foods in healthy ways, such as baking, boiling, grilling, roasting, or broiling. Cook using oils that are good for your heart. These include olive, canola, avocado, soybean, and sunflower oil. Meal planning  Eat a balanced diet. This should include: 4 or more servings of fruits and 4 or more servings of vegetables each day. Try to fill half of your plate with fruits and vegetables. 6-8 servings of whole grains each day. 6 or less servings of lean meat, poultry, or fish each day. 1 oz is 1 serving. A 3 oz (85 g) serving of meat is about the same size as the palm of your hand. One egg is 1 oz (28 g). 2-3 servings of low-fat dairy each day. One serving is 1 cup (237 mL). 1 serving of nuts, seeds, or beans 5 times each week. 2-3 servings of heart-healthy fats. Healthy fats called omega-3 fatty acids are found in foods such as  walnuts, flaxseeds, fortified milks, and eggs. These fats are also found in cold-water fish, such as sardines, salmon, and mackerel. Limit how much you eat of: Canned or prepackaged foods. Food that is high in trans fat, such as fried foods. Food that is high in saturated fat, such as fatty meat. Desserts and other sweets, sugary drinks, and other foods with added sugar. Full-fat dairy products. Do not salt foods before eating. Do not eat more than 4 egg yolks a week. Try to eat at least 2 vegetarian meals a week. Eat more home-cooked food and less restaurant, buffet, and fast food. Lifestyle When eating at a restaurant, ask if your food can be made with less salt or no salt. If you drink alcohol: Limit how much you have to: 0-1 drink a day if you are female. 0-2 drinks a day if you  are female. Know how much alcohol is in your drink. In the [place] one drink is one 12 oz bottle of beer (355 mL), one 5 oz glass of wine (148 mL), or one 1 oz glass of hard liquor (44 mL). General information Avoid eating more than 2,300 mg of salt a day. If you have hypertension, you may need to reduce your sodium intake to 1,500 mg a day. Work with your provider to stay at a healthy body weight or lose weight. Ask what the best weight range is for you. On most days of the week, get at least 30 minutes of exercise that causes your heart to beat faster. This may include walking, swimming, or biking. Work with your provider or dietitian to adjust your eating plan to meet your specific calorie needs. What foods should I eat? Fruits All fresh, dried, or frozen fruit. Canned fruits that are in their natural juice and do not have sugar added to them. Vegetables Fresh or frozen vegetables that are raw, steamed, roasted, or grilled. Low-sodium or reduced-sodium tomato and vegetable juice. Low-sodium or reduced-sodium tomato sauce and tomato paste. Low-sodium or reduced-sodium canned vegetables. Grains Whole-grain or whole-wheat bread. Whole-grain or whole-wheat pasta. Brown rice. [place]il Madeira. Bulgur. Whole-grain and low-sodium cereals. Pita bread. Low-fat, low-sodium crackers. Whole-wheat flour tortillas. Meats and other proteins Skinless chicken or turkey. Ground chicken or turkey. Pork with fat trimmed off. Fish and seafood. Egg whites. Dried beans, peas, or lentils. Unsalted nuts, nut butters, and seeds. Unsalted canned beans. Lean cuts of beef with fat trimmed off. Low-sodium, lean precooked or cured meat, such as sausages or meat loaves. Dairy Low-fat (1%) or fat-free (skim) milk. Reduced-fat, low-fat, or fat-free cheeses. Nonfat, low-sodium ricotta or cottage cheese. Low-fat or nonfat yogurt. Low-fat, low-sodium cheese. Fats and oils Soft margarine without trans fats. Vegetable oil. Reduced-fat,  low-fat, or light mayonnaise and salad dressings (reduced-sodium). Canola, safflower, olive, avocado, soybean, and sunflower oils. Avocado. Seasonings and condiments Herbs. Spices. Seasoning mixes without salt. Other foods Unsalted popcorn and pretzels. Fat-free sweets. The items listed above may not be all the foods and drinks you can have. Talk to a dietitian to learn more. What foods should I avoid? Fruits Canned fruit in a light or heavy syrup. Fried fruit. Fruit in cream or butter sauce. Vegetables Creamed or fried vegetables. Vegetables in a cheese sauce. Regular canned vegetables that are not marked as low-sodium or reduced-sodium. Regular canned tomato sauce and paste that are not marked as low-sodium or reduced-sodium. Regular tomato and vegetable juices that are not marked as low-sodium or reduced-sodium. Dene. Olives. Grains Baked goods made with fat, such  as croissants, muffins, or some breads. Dry pasta or rice meal packs. Meats and other proteins Fatty cuts of meat. Ribs. Fried meat. [place]. Bologna, salami, and other precooked or cured meats, such as sausages or meat loaves, that are not lean and low in sodium. Fat from the back of a pig (fatback). Bratwurst. Salted nuts and seeds. Canned beans with added salt. Canned or smoked fish. Whole eggs or egg yolks. Chicken or turkey with skin. Dairy Whole or 2% milk, cream, and half-and-half. Whole or full-fat cream cheese. Whole-fat or sweetened yogurt. Full-fat cheese. Nondairy creamers. Whipped toppings. Processed cheese and cheese spreads. Fats and oils Butter. Stick margarine. Lard. Shortening. Ghee. Bacon fat. Tropical oils, such as coconut, palm kernel, or palm oil. Seasonings and condiments Onion salt, garlic salt, seasoned salt, table salt, and sea salt. Worcestershire sauce. Tartar sauce. Barbecue sauce. Teriyaki sauce. Soy sauce, including reduced-sodium soy sauce. Steak sauce. Canned and packaged gravies. Fish sauce. Oyster  sauce. Cocktail sauce. Store-bought horseradish. Ketchup. Mustard. Meat flavorings and tenderizers. Bouillon cubes. Hot sauces. Pre-made or packaged marinades. Pre-made or packaged taco seasonings. Relishes. Regular salad dressings. Other foods Salted popcorn and pretzels. The items listed above may not be all the foods and drinks you should avoid. Talk to a dietitian to learn more. Where to find more information National Heart, Lung, and Blood Institute (NHLBI): buffalodrycleaner.gl American Heart Association (AHA): heart.org Academy of Nutrition and Dietetics: eatright.org National Kidney Foundation (NKF): kidney.org This information is not intended to replace advice given to you by your health care provider. Make sure you discuss any questions you have with your health care provider. Document Revised: 04/04/2022 Document Reviewed: 04/04/2022 Elsevier Patient Education  2024 Elsevier Inc.    Daily Weight Record It is important to weigh yourself daily. To do this: Make sure you use a reliable scale. Use the same scale each day. Keep this daily weight chart near your scale. Weigh yourself each morning at the same time after you use the bathroom. Before weighing yourself: Take off your shoes. Make sure you are wearing the same amount of clothing each day. Write down your weight in the spaces on the form. Compare today's weight to yesterday's weight. Bring this form with you to your follow-up visits with your health care provider. Call your health care provider if you have concerns about your weight, including rapid weight gain or loss. Date: ________ Weight: ____________________ Date: ________ Weight: ____________________ Date: ________ Weight: ____________________ Date: ________ Weight: ____________________ Date: ________ Weight: ____________________ Date: ________ Weight: ____________________ Date: ________ Weight: ____________________ Date: ________ Weight: ____________________ Date:  ________ Weight: ____________________ Date: ________ Weight: ____________________ Date: ________ Weight: ____________________ Date: ________ Weight: ____________________ Date: ________ Weight: ____________________ Date: ________ Weight: ____________________ Date: ________ Weight: ____________________ Date: ________ Weight: ____________________ Date: ________ Weight: ____________________ Date: ________ Weight: ____________________ Date: ________ Weight: ____________________ Date: ________ Weight: ____________________ Date: ________ Weight: ____________________ Date: ________ Weight: ____________________ Date: ________ Weight: ____________________ Date: ________ Weight: ____________________ Date: ________ Weight: ____________________ Date: ________ Weight: ____________________ Date: ________ Weight: ____________________ Date: ________ Weight: ____________________ Date: ________ Weight: ____________________ Date: ________ Weight: ____________________ Date: ________ Weight: ____________________ Date: ________ Weight: ____________________ Date: ________ Weight: ____________________ Date: ________ Weight: ____________________ Date: ________ Weight: ____________________ Date: ________ Weight: ____________________ Date: ________ Weight: ____________________ Date: ________ Weight: ____________________ Date: ________ Weight: ____________________ Date: ________ Weight: ____________________ Date: ________ Weight: ____________________ Date: ________ Weight: ____________________ Date: ________ Weight: ____________________ Date: ________ Weight: ____________________ Date: ________ Weight: ____________________ Date: ________ Weight: ____________________ Date: ________ Weight: ____________________ Date: ________  Weight: ____________________ Date: ________ Weight: ____________________ Date: ________ Weight: ____________________ This information is not intended to replace advice given to you by your health care  provider. Make sure you discuss any questions you have with your health care provider. Document Revised: 11/21/2020 Document Reviewed: 11/21/2020 Elsevier Patient Education  2024 Elsevier Inc.   Heart Failure: How to Prevent It Heart failure is a condition where the heart has trouble pumping blood. This may mean that the heart can't pump enough blood out to the body or that the heart doesn't fill up with enough blood. These problems can lead to symptoms like tiredness, trouble breathing, and swelling in the body. Heart failure is a common medical condition that affects the whole body, not just the heart. By making changes to your diet and lifestyle, you can help prevent heart failure and avoid serious health problems. How can this condition affect me? Heart failure can cause some serious problems that may get worse over time. These may include: Feeling tired during normal physical activities. Trouble breathing. Swelling in your legs, ankles, or feet. Weight gain. A cough. What can increase my risk? The risk of heart failure increases as a person gets older. The most common causes are high blood pressure and blocked blood vessels known as coronary artery disease.  Other things that may make you more likely to get heart failure include: Having any of these medical conditions: Heart valve problems. Diabetes. Thyroid  problems. Heart rhythm problems. Low blood counts. This is called anemia. Lung disease. Chronic kidney disease. Using tobacco or nicotine  products. Using alcohol or other substances. Taking medicines that can damage the heart, like chemotherapy drugs. Having a family history of heart failure. Being very overweight or obese. Infections caused by viruses. What actions can I take to prevent heart failure? Eating and drinking  If you're overweight or obese, talk with your health care provider about how many calories you should have each day to help you lose weight. Eat foods  that are low in salt (sodium) and avoid adding extra salt to foods. Eat a balanced diet that includes: Fresh fruits and vegetables. Whole grains. Lean protein. Beans. Fat-free or low-fat dairy products. Seafood 1-2 days a week. Avoid foods that have a lot of: Trans fats. Saturated fats. Sugar. Cholesterol. You may want to work with an expert in healthy eating called a dietitian. Alcohol Do not drink alcohol if: Your provider tells you not to drink. You're pregnant, may be pregnant, or plan to become pregnant. If you drink alcohol: Limit how much you have to: 0-1 drink a day if you're female. 0-2 drinks a day if you're female. Know how much alcohol is in your drink. In the [place] one drink is one 12 oz bottle of beer (355 mL), one 5 oz glass of wine (148 mL), or one 1 oz glass of hard liquor (44 mL). Lifestyle  Do not smoke, vape, or use nicotine  or tobacco. Exercise for at least 30 minutes, 5 days each week, or as much as told by your provider. Do moderate-intensity exercise, like brisk walking, biking, or water aerobics. Ask your provider which activities are safe for you. Try to get 7-9 hours of sleep each night. To help with sleep: Keep your bedroom cool and dark. Do not eat a heavy meal during the hour before you go to bed. Do not drink alcohol or drinks with caffeine before bed. Avoid screentime before bedtime. This means avoiding TV, computers, tablets, and mobile phones. Find ways to relax and  manage stress. These may include: Breathing exercises. Meditation. Yoga. Listening to music. General instructions See a provider regularly for screening and wellness checks. Work with your provider to manage your: Blood pressure. Cholesterol levels. Blood sugar, also called glucose levels. Weight. Where to find more information National Heart, Lung, and Blood Institute: buffalodrycleaner.gl National Institute on Aging: baseringtones.pl American Heart Association: heart.org Contact a  health care provider if: You're gaining weight quickly. You have increasing shortness of breath that's unusual for you. You tire easily or you're not able to do your usual activities. You cough more than normal, especially when doing physical activity. You have swelling, or more swelling, in areas such as your hands, feet, or ankles. You have fast or irregular heartbeats (palpitations). Get help right away if: You have trouble breathing. You have pain or discomfort in your chest. You faint. These symptoms may be an emergency. Call 911 right away. Do not wait to see if the symptoms will go away. Do not drive yourself to the hospital. This information is not intended to replace advice given to you by your health care provider. Make sure you discuss any questions you have with your health care provider. Document Revised: 08/23/2022 Document Reviewed: 08/23/2022 Elsevier Patient Education  2025 [employer]  Heart Failure: How to Manage Heart failure is a long-term condition where your heart can't pump enough blood through your body. When this happens, parts of your body don't get the blood and oxygen they need. There's no cure for heart failure, so it's important to take good care of yourself and follow the treatment plan you set with your health care provider. If you're living with heart failure, there are ways to help you manage the disease. How to manage lifestyle changes Living with heart failure requires you to make changes in your daily life. Your health care team will teach you about the changes you need to make. These changes can help improve your symptoms and lower your risk of going to the hospital. Work with your provider to create a plan for you. Activity Ask your provider about cardiac rehabilitation programs. These programs include physical activity. If no cardiac rehab program is available, ask your provider what exercises are safe for you to do. Ask what things are safe for  you to do at home. Ask when you can go back to work or school. Pace your daily activities and allow time for rest. Managing stress It's normal to have many emotions when you're told you have heart failure. You may have fear, sadness, and anger. If you need help coping with any of these emotions, let your provider know. Here are some ways to help yourself manage these emotions: Talk to your friends and family about your condition. They can give you support and guidance. Explain your symptoms to them. If comfortable, you can invite them to attend appointments with you. Join a support group for people with heart failure. Talking with others who have the same symptoms may give you new ways of coping with your disease and your emotions. Accept help from others. Do not be ashamed if you need help. Use stress management techniques. Try things like meditation, breathing exercises, or listening to relaxing music. Conditions like depression and anxiety are common in people with heart failure. Pay attention to changes in your mood, emotions, and stress levels. Tell your provider if you have any of the following symptoms: Trouble sleeping or a change in your sleeping patterns. Feeling sad or depressed. Losing interest in  activities you normally enjoy. Feeling irritable or crying for no reason. Often worrying about the future. Work If heart failure affects your ability to work, you may need to talk with your provider about making a plan for changes. This may include: Reducing your work hours. Finding tasks that require less effort. Planning rest periods during work hours. Travel Talk with your provider if you plan to travel. There may be times when your provider suggests that you don't travel or you wait until your condition has improved. Bring your medicine and a list of your medicines. If you're traveling by public transportation (airplane, train, bus), keep your medicines with you in a carry-on  bag. If you have special needs, contact the transportation company prior to traveling. This may include needs related to diet, oxygen, a wheelchair, a seating request, or help with luggage. Think about finding a medical facility in the area you'll be traveling to. Find out what your health insurance will cover. If you use oxygen, make sure you bring enough oxygen with you. If you have a battery-powered oxygen device, bring a fully charged extra battery with you. If you have an implanted device, bring a note from your provider and tell the security screening workers that you have the device. You may need to go through special screening. Sexual activity  Ask your provider when it's safe for you to resume sexual activity. You may need to start slowly and increase intimacy over time. Regular exercise can benefit your sex life by building strength and endurance. Sleep If your condition affects your sleep, find ways to improve how well you sleep at night. These tips may help: Sleep lying on your side, or sleep with your head raised. Try: Raising the head of your bed. Using more than one pillow. Ask your provider about screening for sleep apnea. Try to go to sleep and wake up at the same times every day. Sleep in a dark, cool room. Do not exercise or eat for a few hours before bedtime. Plan rest periods during the day. But don't take long naps.  Where to find support In addition to talking with your family or friends, consider talking with: A mental health professional or therapist. A member of your church, faith, or community group. Other sources of support include: Local support groups. Ask your provider about groups near you. Online support groups, such as those found through: Heart Failure Society of America: hfsa.org American Heart Association: supportnetwork.heart.org Local community agencies or social agencies. A palliative care specialist. Palliative care can help manage symptoms,  promote comfort, improve quality of life, and maintain dignity. Where to find more information To learn more, go to these websites: Centers for Disease Control and Prevention at tonerpromos.no. Then: Click Health Topics. Type heart failure in the search box. American Heart Association: heart.org National Heart, Lung, and Blood Institute: buffalodrycleaner.gl Contact a health care provider if: You have trouble breathing when you're active. You have swelling in your feet or legs. You gain 2-3 lb (0.9-1.4 kg) in 24 hours, or 5 lb (2.3 kg) in a week. You have a dry cough. You're not able to take part in your usual physical activities. You feel dizzy or light-headed when you stand up. You have trouble sleeping. You have a decrease in appetite. You have symptoms of depression or anxiety. Get help right away if: You have trouble breathing when resting. You have trouble staying awake or you feel confused. You have chest pain. You faint. You have fast or irregular  heartbeats. These symptoms may be an emergency. Call 911 right away. Do not wait to see if the symptoms will go away. Do not drive yourself to the hospital. This information is not intended to replace advice given to you by your health care provider. Make sure you discuss any questions you have with your health care provider. Document Revised: 10/31/2022 Document Reviewed: 10/31/2022 Elsevier Patient Education  [address]     Signed, Jon Garre Carlin, [name]  03/01/2024 2:20 PM    [facility]

## 2024-03-01 ENCOUNTER — Ambulatory Visit: Attending: Physician Assistant | Admitting: Physician Assistant

## 2024-03-01 ENCOUNTER — Encounter: Payer: Self-pay | Admitting: Physician Assistant

## 2024-03-01 VITALS — BP 110/70 | HR 70 | Ht 69.5 in | Wt 174.6 lb

## 2024-03-01 DIAGNOSIS — F1410 Cocaine abuse, uncomplicated: Secondary | ICD-10-CM

## 2024-03-01 DIAGNOSIS — I493 Ventricular premature depolarization: Secondary | ICD-10-CM | POA: Diagnosis not present

## 2024-03-01 DIAGNOSIS — I639 Cerebral infarction, unspecified: Secondary | ICD-10-CM | POA: Diagnosis not present

## 2024-03-01 DIAGNOSIS — Z79899 Other long term (current) drug therapy: Secondary | ICD-10-CM | POA: Diagnosis not present

## 2024-03-01 DIAGNOSIS — I5020 Unspecified systolic (congestive) heart failure: Secondary | ICD-10-CM | POA: Diagnosis not present

## 2024-03-01 DIAGNOSIS — E785 Hyperlipidemia, unspecified: Secondary | ICD-10-CM

## 2024-03-01 DIAGNOSIS — F32A Depression, unspecified: Secondary | ICD-10-CM

## 2024-03-01 DIAGNOSIS — F17200 Nicotine dependence, unspecified, uncomplicated: Secondary | ICD-10-CM | POA: Diagnosis not present

## 2024-03-01 MED ORDER — SPIRONOLACTONE 25 MG PO TABS: 12.5000 mg | ORAL_TABLET | Freq: Every day | ORAL | 0 refills | Status: DC

## 2024-03-01 NOTE — Patient Instructions (Signed)
 Medication Instructions:  Your physician has recommended you make the following change in your medication:   START Spironolactone 25 mg taking 1/2 tablet = 12.5 mg daily  *If you need a refill on your cardiac medications before your next appointment, please call your pharmacy*  Lab Work: 1 WEEK, GO TO A [facility] FOR:  BMET  If you have labs (blood work) drawn today and your tests are completely normal, you will receive your results only by: MyChart Message (if you have MyChart) OR A paper copy in the mail If you have any lab test that is abnormal or we need to change your treatment, we will call you to review the results.  Testing/Procedures: None ordered  Follow-Up: At [facility], you and your health needs are our priority.  As part of our continuing mission to provide you with exceptional heart care, our providers are all part of one team.  This team includes your primary Cardiologist (physician) and Advanced Practice Providers or APPs (Physician Assistants and Nurse Practitioners) who all work together to provide you with the care you need, when you need it.  Your next appointment:   2 month(s)  Provider:   Gordy Bergamo, MD    We recommend signing up for the patient portal called MyChart.  Sign up information is provided on this After Visit Summary.  MyChart is used to connect with patients for Virtual Visits (Telemedicine).  Patients are able to view lab/test results, encounter notes, upcoming appointments, etc.  Non-urgent messages can be sent to your provider as well.   To learn more about what you can do with MyChart, go to [email or web address].   Other Instructions DASH Eating Plan DASH stands for Dietary Approaches to Stop Hypertension. The DASH eating plan is a healthy eating plan that has been shown to: Lower high blood pressure (hypertension). Reduce your risk for type 2 diabetes, heart disease, and stroke. Help with weight loss. What are tips for following  this plan? Reading food labels Check food labels for the amount of salt (sodium) per serving. Choose foods with less than 5 percent of the Daily Value (DV) of sodium. In general, foods with less than 300 milligrams (mg) of sodium per serving fit into this eating plan. To find whole grains, look for the word whole as the first word in the ingredient list. Shopping Buy products labeled as low-sodium or no salt added. Buy fresh foods. Avoid canned foods and pre-made or frozen meals. Cooking Try not to add salt when you cook. Use salt-free seasonings or herbs instead of table salt or sea salt. Check with your health care provider or pharmacist before using salt substitutes. Do not fry foods. Cook foods in healthy ways, such as baking, boiling, grilling, roasting, or broiling. Cook using oils that are good for your heart. These include olive, canola, avocado, soybean, and sunflower oil. Meal planning  Eat a balanced diet. This should include: 4 or more servings of fruits and 4 or more servings of vegetables each day. Try to fill half of your plate with fruits and vegetables. 6-8 servings of whole grains each day. 6 or less servings of lean meat, poultry, or fish each day. 1 oz is 1 serving. A 3 oz (85 g) serving of meat is about the same size as the palm of your hand. One egg is 1 oz (28 g). 2-3 servings of low-fat dairy each day. One serving is 1 cup (237 mL). 1 serving of nuts, seeds, or beans 5 times  each week. 2-3 servings of heart-healthy fats. Healthy fats called omega-3 fatty acids are found in foods such as walnuts, flaxseeds, fortified milks, and eggs. These fats are also found in cold-water fish, such as sardines, salmon, and mackerel. Limit how much you eat of: Canned or prepackaged foods. Food that is high in trans fat, such as fried foods. Food that is high in saturated fat, such as fatty meat. Desserts and other sweets, sugary drinks, and other foods with added sugar. Full-fat  dairy products. Do not salt foods before eating. Do not eat more than 4 egg yolks a week. Try to eat at least 2 vegetarian meals a week. Eat more home-cooked food and less restaurant, buffet, and fast food. Lifestyle When eating at a restaurant, ask if your food can be made with less salt or no salt. If you drink alcohol: Limit how much you have to: 0-1 drink a day if you are female. 0-2 drinks a day if you are female. Know how much alcohol is in your drink. In the [place] one drink is one 12 oz bottle of beer (355 mL), one 5 oz glass of wine (148 mL), or one 1 oz glass of hard liquor (44 mL). General information Avoid eating more than 2,300 mg of salt a day. If you have hypertension, you may need to reduce your sodium intake to 1,500 mg a day. Work with your provider to stay at a healthy body weight or lose weight. Ask what the best weight range is for you. On most days of the week, get at least 30 minutes of exercise that causes your heart to beat faster. This may include walking, swimming, or biking. Work with your provider or dietitian to adjust your eating plan to meet your specific calorie needs. What foods should I eat? Fruits All fresh, dried, or frozen fruit. Canned fruits that are in their natural juice and do not have sugar added to them. Vegetables Fresh or frozen vegetables that are raw, steamed, roasted, or grilled. Low-sodium or reduced-sodium tomato and vegetable juice. Low-sodium or reduced-sodium tomato sauce and tomato paste. Low-sodium or reduced-sodium canned vegetables. Grains Whole-grain or whole-wheat bread. Whole-grain or whole-wheat pasta. Brown rice. [place]. Bulgur. Whole-grain and low-sodium cereals. Pita bread. Low-fat, low-sodium crackers. Whole-wheat flour tortillas. Meats and other proteins Skinless chicken or turkey. Ground chicken or turkey. Pork with fat trimmed off. Fish and seafood. Egg whites. Dried beans, peas, or lentils. Unsalted nuts, nut  butters, and seeds. Unsalted canned beans. Lean cuts of beef with fat trimmed off. Low-sodium, lean precooked or cured meat, such as sausages or meat loaves. Dairy Low-fat (1%) or fat-free (skim) milk. Reduced-fat, low-fat, or fat-free cheeses. Nonfat, low-sodium ricotta or cottage cheese. Low-fat or nonfat yogurt. Low-fat, low-sodium cheese. Fats and oils Soft margarine without trans fats. Vegetable oil. Reduced-fat, low-fat, or light mayonnaise and salad dressings (reduced-sodium). Canola, safflower, olive, avocado, soybean, and sunflower oils. Avocado. Seasonings and condiments Herbs. Spices. Seasoning mixes without salt. Other foods Unsalted popcorn and pretzels. Fat-free sweets. The items listed above may not be all the foods and drinks you can have. Talk to a dietitian to learn more. What foods should I avoid? Fruits Canned fruit in a light or heavy syrup. Fried fruit. Fruit in cream or butter sauce. Vegetables Creamed or fried vegetables. Vegetables in a cheese sauce. Regular canned vegetables that are not marked as low-sodium or reduced-sodium. Regular canned tomato sauce and paste that are not marked as low-sodium or reduced-sodium. Regular tomato and  vegetable juices that are not marked as low-sodium or reduced-sodium. Dene. Olives. Grains Baked goods made with fat, such as croissants, muffins, or some breads. Dry pasta or rice meal packs. Meats and other proteins Fatty cuts of meat. Ribs. Fried meat. [place]. Bologna, salami, and other precooked or cured meats, such as sausages or meat loaves, that are not lean and low in sodium. Fat from the back of a pig (fatback). Bratwurst. Salted nuts and seeds. Canned beans with added salt. Canned or smoked fish. Whole eggs or egg yolks. Chicken or turkey with skin. Dairy Whole or 2% milk, cream, and half-and-half. Whole or full-fat cream cheese. Whole-fat or sweetened yogurt. Full-fat cheese. Nondairy creamers. Whipped toppings. Processed  cheese and cheese spreads. Fats and oils Butter. Stick margarine. Lard. Shortening. Ghee. Bacon fat. Tropical oils, such as coconut, palm kernel, or palm oil. Seasonings and condiments Onion salt, garlic salt, seasoned salt, table salt, and sea salt. Worcestershire sauce. Tartar sauce. Barbecue sauce. Teriyaki sauce. Soy sauce, including reduced-sodium soy sauce. Steak sauce. Canned and packaged gravies. Fish sauce. Oyster sauce. Cocktail sauce. Store-bought horseradish. Ketchup. Mustard. Meat flavorings and tenderizers. Bouillon cubes. Hot sauces. Pre-made or packaged marinades. Pre-made or packaged taco seasonings. Relishes. Regular salad dressings. Other foods Salted popcorn and pretzels. The items listed above may not be all the foods and drinks you should avoid. Talk to a dietitian to learn more. Where to find more information National Heart, Lung, and Blood Institute (NHLBI): buffalodrycleaner.gl American Heart Association (AHA): heart.org Academy of Nutrition and Dietetics: eatright.org National Kidney Foundation (NKF): kidney.org This information is not intended to replace advice given to you by your health care provider. Make sure you discuss any questions you have with your health care provider. Document Revised: 04/04/2022 Document Reviewed: 04/04/2022 Elsevier Patient Education  2024 Elsevier Inc.    Daily Weight Record It is important to weigh yourself daily. To do this: Make sure you use a reliable scale. Use the same scale each day. Keep this daily weight chart near your scale. Weigh yourself each morning at the same time after you use the bathroom. Before weighing yourself: Take off your shoes. Make sure you are wearing the same amount of clothing each day. Write down your weight in the spaces on the form. Compare today's weight to yesterday's weight. Bring this form with you to your follow-up visits with your health care provider. Call your health care provider if you have  concerns about your weight, including rapid weight gain or loss. Date: ________ Weight: ____________________ Date: ________ Weight: ____________________ Date: ________ Weight: ____________________ Date: ________ Weight: ____________________ Date: ________ Weight: ____________________ Date: ________ Weight: ____________________ Date: ________ Weight: ____________________ Date: ________ Weight: ____________________ Date: ________ Weight: ____________________ Date: ________ Weight: ____________________ Date: ________ Weight: ____________________ Date: ________ Weight: ____________________ Date: ________ Weight: ____________________ Date: ________ Weight: ____________________ Date: ________ Weight: ____________________ Date: ________ Weight: ____________________ Date: ________ Weight: ____________________ Date: ________ Weight: ____________________ Date: ________ Weight: ____________________ Date: ________ Weight: ____________________ Date: ________ Weight: ____________________ Date: ________ Weight: ____________________ Date: ________ Weight: ____________________ Date: ________ Weight: ____________________ Date: ________ Weight: ____________________ Date: ________ Weight: ____________________ Date: ________ Weight: ____________________ Date: ________ Weight: ____________________ Date: ________ Weight: ____________________ Date: ________ Weight: ____________________ Date: ________ Weight: ____________________ Date: ________ Weight: ____________________ Date: ________ Weight: ____________________ Date: ________ Weight: ____________________ Date: ________ Weight: ____________________ Date: ________ Weight: ____________________ Date: ________ Weight: ____________________ Date: ________ Weight: ____________________ Date: ________ Weight: ____________________ Date: ________ Weight: ____________________ Date: ________ Weight: ____________________ Date: ________ Weight: ____________________ Date: ________  Weight: ____________________ Date: ________ Weight: ____________________ Date: ________ Weight: ____________________ Date: ________ Weight: ____________________ Date: ________ Weight: ____________________ Date: ________ Weight: ____________________ Date: ________ Weight: ____________________ Date: ________ Weight: ____________________ This information is not intended to replace advice given to you by your health care provider. Make sure you discuss any questions you have with your health care provider. Document Revised: 11/21/2020 Document Reviewed: 11/21/2020 Elsevier Patient Education  2024 Elsevier Inc.   Heart Failure: How to Prevent It Heart failure is a condition where the heart has trouble pumping blood. This may mean that the heart can't pump enough blood out to the body or that the heart doesn't fill up with enough blood. These problems can lead to symptoms like tiredness, trouble breathing, and swelling in the body. Heart failure is a common medical condition that affects the whole body, not just the heart. By making changes to your diet and lifestyle, you can help prevent heart failure and avoid serious health problems. How can this condition affect me? Heart failure can cause some serious problems that may get worse over time. These may include: Feeling tired during normal physical activities. Trouble breathing. Swelling in your legs, ankles, or feet. Weight gain. A cough. What can increase my risk? The risk of heart failure increases as a person gets older. The most common causes are high blood pressure and blocked blood vessels known as coronary artery disease.  Other things that may make you more likely to get heart failure include: Having any of these medical conditions: Heart valve problems. Diabetes. Thyroid  problems. Heart rhythm problems. Low blood counts. This is called anemia. Lung disease. Chronic kidney disease. Using tobacco or nicotine  products. Using alcohol  or other substances. Taking medicines that can damage the heart, like chemotherapy drugs. Having a family history of heart failure. Being very overweight or obese. Infections caused by viruses. What actions can I take to prevent heart failure? Eating and drinking  If you're overweight or obese, talk with your health care provider about how many calories you should have each day to help you lose weight. Eat foods that are low in salt (sodium) and avoid adding extra salt to foods. Eat a balanced diet that includes: Fresh fruits and vegetables. Whole grains. Lean protein. Beans. Fat-free or low-fat dairy products. Seafood 1-2 days a week. Avoid foods that have a lot of: Trans fats. Saturated fats. Sugar. Cholesterol. You may want to work with an expert in healthy eating called a dietitian. Alcohol Do not drink alcohol if: Your provider tells you not to drink. You're pregnant, may be pregnant, or plan to become pregnant. If you drink alcohol: Limit how much you have to: 0-1 drink a day if you're female. 0-2 drinks a day if you're female. Know how much alcohol is in your drink. In the [place] one drink is one 12 oz bottle of beer (355 mL), one 5 oz glass of wine (148 mL), or one 1 oz glass of hard liquor (44 mL). Lifestyle  Do not smoke, vape, or use nicotine  or tobacco. Exercise for at least 30 minutes, 5 days each week, or as much as told by your provider. Do moderate-intensity exercise, like brisk walking, biking, or water aerobics. Ask your provider which activities are safe for you. Try to get 7-9 hours of sleep each night. To help with sleep: Keep your bedroom cool and dark. Do not eat a heavy meal during the hour before you go to bed. Do not drink alcohol or drinks with caffeine before  bed. Avoid screentime before bedtime. This means avoiding TV, computers, tablets, and mobile phones. Find ways to relax and manage stress. These may include: Breathing  exercises. Meditation. Yoga. Listening to music. General instructions See a provider regularly for screening and wellness checks. Work with your provider to manage your: Blood pressure. Cholesterol levels. Blood sugar, also called glucose levels. Weight. Where to find more information National Heart, Lung, and Blood Institute: buffalodrycleaner.gl National Institute on Aging: baseringtones.pl American Heart Association: heart.org Contact a health care provider if: You're gaining weight quickly. You have increasing shortness of breath that's unusual for you. You tire easily or you're not able to do your usual activities. You cough more than normal, especially when doing physical activity. You have swelling, or more swelling, in areas such as your hands, feet, or ankles. You have fast or irregular heartbeats (palpitations). Get help right away if: You have trouble breathing. You have pain or discomfort in your chest. You faint. These symptoms may be an emergency. Call 911 right away. Do not wait to see if the symptoms will go away. Do not drive yourself to the hospital. This information is not intended to replace advice given to you by your health care provider. Make sure you discuss any questions you have with your health care provider. Document Revised: 08/23/2022 Document Reviewed: 08/23/2022 Elsevier Patient Education  2025 Elsevier Inc.  Heart Failure: How to Manage Heart failure is a long-term condition where your heart can't pump enough blood through your body. When this happens, parts of your body don't get the blood and oxygen they need. There's no cure for heart failure, so it's important to take good care of yourself and follow the treatment plan you set with your health care provider. If you're living with heart failure, there are ways to help you manage the disease. How to manage lifestyle changes Living with heart failure requires you to make changes in your daily life. Your  health care team will teach you about the changes you need to make. These changes can help improve your symptoms and lower your risk of going to the hospital. Work with your provider to create a plan for you. Activity Ask your provider about cardiac rehabilitation programs. These programs include physical activity. If no cardiac rehab program is available, ask your provider what exercises are safe for you to do. Ask what things are safe for you to do at home. Ask when you can go back to work or school. Pace your daily activities and allow time for rest. Managing stress It's normal to have many emotions when you're told you have heart failure. You may have fear, sadness, and anger. If you need help coping with any of these emotions, let your provider know. Here are some ways to help yourself manage these emotions: Talk to your friends and family about your condition. They can give you support and guidance. Explain your symptoms to them. If comfortable, you can invite them to attend appointments with you. Join a support group for people with heart failure. Talking with others who have the same symptoms may give you new ways of coping with your disease and your emotions. Accept help from others. Do not be ashamed if you need help. Use stress management techniques. Try things like meditation, breathing exercises, or listening to relaxing music. Conditions like depression and anxiety are common in people with heart failure. Pay attention to changes in your mood, emotions, and stress levels. Tell your provider if you have any of  the following symptoms: Trouble sleeping or a change in your sleeping patterns. Feeling sad or depressed. Losing interest in activities you normally enjoy. Feeling irritable or crying for no reason. Often worrying about the future. Work If heart failure affects your ability to work, you may need to talk with your provider about making a plan for changes. This may  include: Reducing your work hours. Finding tasks that require less effort. Planning rest periods during work hours. Travel Talk with your provider if you plan to travel. There may be times when your provider suggests that you don't travel or you wait until your condition has improved. Bring your medicine and a list of your medicines. If you're traveling by public transportation (airplane, train, bus), keep your medicines with you in a carry-on bag. If you have special needs, contact the transportation company prior to traveling. This may include needs related to diet, oxygen, a wheelchair, a seating request, or help with luggage. Think about finding a medical facility in the area you'll be traveling to. Find out what your health insurance will cover. If you use oxygen, make sure you bring enough oxygen with you. If you have a battery-powered oxygen device, bring a fully charged extra battery with you. If you have an implanted device, bring a note from your provider and tell the security screening workers that you have the device. You may need to go through special screening. Sexual activity  Ask your provider when it's safe for you to resume sexual activity. You may need to start slowly and increase intimacy over time. Regular exercise can benefit your sex life by building strength and endurance. Sleep If your condition affects your sleep, find ways to improve how well you sleep at night. These tips may help: Sleep lying on your side, or sleep with your head raised. Try: Raising the head of your bed. Using more than one pillow. Ask your provider about screening for sleep apnea. Try to go to sleep and wake up at the same times every day. Sleep in a dark, cool room. Do not exercise or eat for a few hours before bedtime. Plan rest periods during the day. But don't take long naps.  Where to find support In addition to talking with your family or friends, consider talking with: A mental  health professional or therapist. A member of your church, faith, or community group. Other sources of support include: Local support groups. Ask your provider about groups near you. Online support groups, such as those found through: Heart Failure Society of America: hfsa.org American Heart Association: supportnetwork.heart.org Local community agencies or social agencies. A palliative care specialist. Palliative care can help manage symptoms, promote comfort, improve quality of life, and maintain dignity. Where to find more information To learn more, go to these websites: Centers for Disease Control and Prevention at tonerpromos.no. Then: Click Health Topics. Type heart failure in the search box. American Heart Association: heart.org National Heart, Lung, and Blood Institute: buffalodrycleaner.gl Contact a health care provider if: You have trouble breathing when you're active. You have swelling in your feet or legs. You gain 2-3 lb (0.9-1.4 kg) in 24 hours, or 5 lb (2.3 kg) in a week. You have a dry cough. You're not able to take part in your usual physical activities. You feel dizzy or light-headed when you stand up. You have trouble sleeping. You have a decrease in appetite. You have symptoms of depression or anxiety. Get help right away if: You have trouble breathing when resting. You  have trouble staying awake or you feel confused. You have chest pain. You faint. You have fast or irregular heartbeats. These symptoms may be an emergency. Call 911 right away. Do not wait to see if the symptoms will go away. Do not drive yourself to the hospital. This information is not intended to replace advice given to you by your health care provider. Make sure you discuss any questions you have with your health care provider. Document Revised: 10/31/2022 Document Reviewed: 10/31/2022 Elsevier Patient Education  2024 [employer].

## 2024-03-17 ENCOUNTER — Other Ambulatory Visit (HOSPITAL_COMMUNITY): Payer: Self-pay

## 2024-03-17 DIAGNOSIS — Z87891 Personal history of nicotine dependence: Secondary | ICD-10-CM

## 2024-03-17 NOTE — Progress Notes (Unsigned)
 [facility] Clinic Note - Initial Visit   Date: 03/17/2024  [name] MRN: [id number] DOB: [birth year]   L MCA stroke  [name] is a 53 y.o. ***-handed female with a history of tobacco abuse, cocaine use, hyperlipidemia, depression, anxiety, osteoporosis, hypothyroidism insomnia, history of tardive dyskinesia, reported TIA on December 2023) with apparently negative MRI, no records available)*** presenting for evaluation of recent L MCA stroke.  She presented to the hospital with severe dysarthria and left-sided weakness.  She received TNK with resolution, symptoms, except for lingering mild dysarthria.  NIH on admission was 6.  Code stroke was initiated, CT of the head showed left MCA small cortical infarct likely due to uncontrolled risk factors versus cardioembolic.  CTA of the head and neck without LVO, curvilinear defect of the right ICA, minimal atherosclerotic calcifications within cavernous internal carotid arteries bilaterally.  2D echo normal LVEF, LLE venous Doppler without evidence of DVT bilaterally, TCD bubble study negative, UDS was positive for cocaine and benzo.  She was discharged from the hospital on baby aspirin  and Plavix  for 3 weeks then aspirin  alone.***.    Secondary stroke prevention, with goal BP less than 140/90, LDL 70 and A1c less than 6 Recommend good control of cardiovascular risk factors, continue baby aspirin .   Tobacco cessation counseled, she reports that she is not ready to quit Substance abuse cessation counseled, patient reports that she is not ready to quit Continue to control mood as per psychiatry, she is on Effexor , Celexa  for MDD and she is on Xanax  for GAD Continue PT for strength and balance  Discussed the use of AI scribe software for clinical note transcription with the patient, who gave verbal consent to proceed.  History of Present Illness         Patient  never had a similar episode.Denies any  history of TIA. Denies vertigo dizziness or vision changes. Denies headaches, dysarthria or dysphagia. No confusion or seizures. Denies any chest pain, or shortness of breath. Denies any fever or chills, or night sweats. No tobacco. No new meds or hormonal supplements. Does take a regular ASA a day, with no other antiplatelets or anticoagulants.Denies any recent long distance trips or recent surgeries. No sick contacts. No new stressors present in personal life Patient is compliant with his medications. .Patient is very active, exercising daily. He is not a diabetic. No family history of stroke Patient was not administered TPA  as is beyond time window for treatment consideration.    Out-side paper records, electronic medical record, and images have been reviewed where available and summarized as:  CTA head & neck no LVO, curvilinear defect in right ICA, minimal atherosclerotic calcifications within cavernous internal carotid arteries bilaterally MRI pending 2D Echo EF 60-65% TEE: No thrombus, very small PFO, LVEF 45 to 50% LE venous doppler no evidence of DVT bilaterally TCD bubble study negative Recommend 30-day cardiac event monitor as outpatient to rule out A-fib LDL 120 HgbA1c 5.0 UDS + cocaine and benzo VTE prophylaxis -SCDs No antithrombotic prior to admission, was on aspirin  81 mg and plavix  daily for 3 weeks and then ASA alone. Results     Lab Results  Component Value Date   HGBA1C 5.0 12/28/2023   No results found for: VITAMINB12 Lab Results  Component Value Date   TSH 42.500 (H) 08/05/2022   No results found for: ESRSEDRATE, POCTSEDRATE  Past Medical History:  Diagnosis Date   Anxiety    Chronic back pain  Chronic neck pain    Complication of anesthesia    pt states her O2 saturation drops after surgery   GERD (gastroesophageal reflux disease)    Headache    migraines- last one 2 months ago   Hypercholesteremia    Hypothyroidism    Kidney stones     Osteoporosis    Reflex sympathetic dystrophy    Tardive dyskinesia    Thyroid  disease     Past Surgical History:  Procedure Laterality Date   ABDOMINAL HYSTERECTOMY     partial   APPENDECTOMY     BACK SURGERY     plates and screws   BREAST SURGERY Left    fibrocystic tumor   CARPAL TUNNEL RELEASE Right 03/22/2019   Procedure: RIGHT CARPAL TUNNEL RELEASE;  Surgeon: Murrell Drivers, MD;  Location: [facility];  Service: Orthopedics;  Laterality: Right;  NO LOCAL   CHOLECYSTECTOMY     CYST EXCISION Left    foot   EAR CYST EXCISION Right 01/27/2013   Procedure: EXCISION CYST ON SCALP ;  Surgeon: Oneil DELENA Budge, MD;  Location: [facility];  Service: General;  Laterality: Right;   EAR CYST EXCISION Right 01/27/2013   Procedure: CYST REMOVAL right axilla;  Surgeon: Oneil DELENA Budge, MD;  Location: [facility];  Service: General;  Laterality: Right;   KNEE ARTHROSCOPY Left    NECK SURGERY     plate in neck   SALPINGOOPHORECTOMY Right    STRABISMUS SURGERY Bilateral    THYROIDECTOMY N/A 03/14/2014   Procedure: TOTAL THYROIDECTOMY;  Surgeon: Oneil DELENA Budge, MD;  Location: [facility];  Service: General;  Laterality: N/A;   TRANSESOPHAGEAL ECHOCARDIOGRAM (CATH LAB) N/A 12/31/2023   Procedure: TRANSESOPHAGEAL ECHOCARDIOGRAM;  Surgeon: Jeffrie Oneil BROCKS, MD;  Location: [facility];  Service: Cardiovascular;  Laterality: N/A;     Medications:  Outpatient Encounter Medications as of 03/19/2024  Medication Sig   ALPRAZolam  (XANAX ) 1 MG tablet Take 1 mg by mouth 4 (four) times daily.   aspirin  EC 81 MG tablet Take 1 tablet (81 mg total) by mouth daily. Swallow whole.   baclofen  (LIORESAL ) 10 MG tablet Take 10 mg by mouth 3 (three) times daily as needed for muscle spasms.   citalopram  (CELEXA ) 40 MG tablet Take 40 mg by mouth daily.   levothyroxine  (SYNTHROID ) 75 MCG tablet Take 1 tablet (75 mcg total) by mouth daily before breakfast.   OVER THE COUNTER MEDICATION Take 1 tablet by mouth 2  (two) times daily. OTC sinus medication   rosuvastatin  (CRESTOR ) 20 MG tablet Take 1 tablet (20 mg total) by mouth daily.   spironolactone  (ALDACTONE ) 25 MG tablet Take 0.5 tablets (12.5 mg total) by mouth daily.   traZODone  (DESYREL ) 50 MG tablet Take 100 mg by mouth at bedtime.   venlafaxine  (EFFEXOR ) 37.5 MG tablet Take 37.5 mg by mouth 2 (two) times daily.   No facility-administered encounter medications on file as of 03/19/2024.    Allergies: Allergies[1]  Family History: Family History  Problem Relation Age of Onset   Breast cancer Mother    Parkinson's disease Father    Depression Sister     Social History: Social History[2] Social History   Social History Narrative   Lives alone.    Vital Signs:  There were no vitals taken for this visit.   General Medical Exam:   General:  Well appearing, comfortable.   Eyes/ENT: see cranial nerve examination.   Neck:   No carotid bruits. Respiratory:  Clear  to auscultation, good air entry bilaterally.   Cardiac:  Regular rate and rhythm, no murmur.   Extremities:  No deformities, edema, or skin discoloration.  Skin:  No rashes or lesions.  Neurological Exam: MENTAL STATUS including orientation to time, place, person, recent and remote memory, attention span and concentration, language, and fund of knowledge is ***normal.  Speech is not dysarthric.  CRANIAL NERVES: II:  No visual field defects.  Unremarkable fundi.   III-IV-VI: Pupils equal round and reactive to light.  Normal conjugate, extra-ocular eye movements in all directions of gaze.  No nystagmus.  No ptosis***.   V:  Normal facial sensation.    VII:  Normal facial symmetry and movements.   VIII:  Normal hearing and vestibular function.   IX-X:  Normal palatal movement.   XI:  Normal shoulder shrug and head rotation.   XII:  Normal tongue strength and range of motion, no deviation or fasciculation.  MOTOR:  No atrophy, fasciculations or abnormal movements.  No  pronator drift.    SENSORY:  Normal and symmetric perception of light touch, pinprick, vibration, and proprioception.  Romberg's sign absent.   COORDINATION/GAIT: Normal finger-to- nose-finger and heel-to-shin.  Intact rapid alternating movements bilaterally.  Able to rise from a chair without using arms.  Gait narrow based and stable. Tandem and stressed gait intact.     Total time spent:  ***   Thank you for allowing me to participate in patient's care.  If I can answer any additional questions, I would be pleased to do so.    Sincerely,   Camie Sevin, PA-C      [1]  Allergies Allergen Reactions   Darvocet [Propoxyphene N-Acetaminophen ] Hives   Lyrica [Pregabalin] Swelling  [2]  Social History Tobacco Use   Smoking status: Every Day    Current packs/day: 0.50    Average packs/day: 0.5 packs/day for 20.0 years (10.0 ttl pk-yrs)    Types: Cigarettes   Smokeless tobacco: Never  Substance Use Topics   Alcohol use: Yes    Comment: occasionally   Drug use: No    Comment: prescription Rx

## 2024-03-19 ENCOUNTER — Ambulatory Visit: Admitting: Physician Assistant

## 2024-03-19 ENCOUNTER — Ambulatory Visit: Payer: Self-pay | Admitting: Cardiology

## 2024-03-19 VITALS — BP 94/65 | HR 84 | Resp 18 | Wt 178.0 lb

## 2024-03-19 DIAGNOSIS — I639 Cerebral infarction, unspecified: Secondary | ICD-10-CM | POA: Diagnosis not present

## 2024-03-19 DIAGNOSIS — R413 Other amnesia: Secondary | ICD-10-CM

## 2024-03-19 NOTE — Patient Instructions (Signed)
 Follow in  3 months for memory

## 2024-04-08 ENCOUNTER — Telehealth: Payer: Self-pay

## 2024-04-08 NOTE — Patient Outreach (Signed)
 Telephone outreach to patient to obtain mRS was successfully completed. MRS= 1  [name] [facility] Assistant Direct Dial: [phone]  Fax: [phone] Website: [email or web address]

## 2024-04-20 ENCOUNTER — Encounter (HOSPITAL_COMMUNITY): Payer: Self-pay

## 2024-04-20 ENCOUNTER — Other Ambulatory Visit: Payer: Self-pay

## 2024-04-20 ENCOUNTER — Emergency Department (HOSPITAL_COMMUNITY)

## 2024-04-20 ENCOUNTER — Observation Stay (HOSPITAL_COMMUNITY)
Admission: EM | Admit: 2024-04-20 | Discharge: 2024-04-21 | Disposition: A | Discharge status: Home / Self Care | Attending: Family Medicine | Admitting: Family Medicine

## 2024-04-20 ENCOUNTER — Telehealth: Payer: Self-pay | Admitting: Cardiology

## 2024-04-20 DIAGNOSIS — F17210 Nicotine dependence, cigarettes, uncomplicated: Secondary | ICD-10-CM | POA: Diagnosis not present

## 2024-04-20 DIAGNOSIS — R4789 Other speech disturbances: Secondary | ICD-10-CM

## 2024-04-20 DIAGNOSIS — Z79899 Other long term (current) drug therapy: Secondary | ICD-10-CM | POA: Insufficient documentation

## 2024-04-20 DIAGNOSIS — F1410 Cocaine abuse, uncomplicated: Secondary | ICD-10-CM | POA: Diagnosis not present

## 2024-04-20 DIAGNOSIS — I110 Hypertensive heart disease with heart failure: Secondary | ICD-10-CM | POA: Insufficient documentation

## 2024-04-20 DIAGNOSIS — Z8673 Personal history of transient ischemic attack (TIA), and cerebral infarction without residual deficits: Secondary | ICD-10-CM | POA: Diagnosis not present

## 2024-04-20 DIAGNOSIS — Z7982 Long term (current) use of aspirin: Secondary | ICD-10-CM | POA: Insufficient documentation

## 2024-04-20 DIAGNOSIS — E785 Hyperlipidemia, unspecified: Secondary | ICD-10-CM | POA: Diagnosis not present

## 2024-04-20 DIAGNOSIS — R2990 Unspecified symptoms and signs involving the nervous system: Secondary | ICD-10-CM | POA: Diagnosis not present

## 2024-04-20 DIAGNOSIS — I509 Heart failure, unspecified: Secondary | ICD-10-CM | POA: Diagnosis not present

## 2024-04-20 DIAGNOSIS — I639 Cerebral infarction, unspecified: Secondary | ICD-10-CM | POA: Diagnosis present

## 2024-04-20 DIAGNOSIS — R079 Chest pain, unspecified: Secondary | ICD-10-CM | POA: Diagnosis not present

## 2024-04-20 DIAGNOSIS — R29818 Other symptoms and signs involving the nervous system: Principal | ICD-10-CM | POA: Insufficient documentation

## 2024-04-20 DIAGNOSIS — R531 Weakness: Principal | ICD-10-CM

## 2024-04-20 DIAGNOSIS — E890 Postprocedural hypothyroidism: Secondary | ICD-10-CM | POA: Insufficient documentation

## 2024-04-20 DIAGNOSIS — R519 Headache, unspecified: Secondary | ICD-10-CM | POA: Diagnosis not present

## 2024-04-20 DIAGNOSIS — I10 Essential (primary) hypertension: Secondary | ICD-10-CM

## 2024-04-20 HISTORY — DX: Cerebral infarction, unspecified: I63.9

## 2024-04-20 HISTORY — DX: Heart failure, unspecified: I50.9

## 2024-04-20 LAB — I-STAT CHEM 8, ED
BUN: 17 mg/dL (ref 6–20)
Calcium, Ion: 1.08 mmol/L — ABNORMAL LOW (ref 1.15–1.40)
Chloride: 103 mmol/L (ref 98–111)
Creatinine, Ser: 1 mg/dL (ref 0.44–1.00)
Glucose, Bld: 91 mg/dL (ref 70–99)
HCT: 37 % (ref 36.0–46.0)
Hemoglobin: 12.6 g/dL (ref 12.0–15.0)
Potassium: 4 mmol/L (ref 3.5–5.1)
Sodium: 140 mmol/L (ref 135–145)
TCO2: 28 mmol/L (ref 22–32)

## 2024-04-20 LAB — PRO BRAIN NATRIURETIC PEPTIDE: Pro Brain Natriuretic Peptide: 51.3 pg/mL

## 2024-04-20 LAB — CBC WITH DIFFERENTIAL/PLATELET
Abs Immature Granulocytes: 0.03 K/uL (ref 0.00–0.07)
Basophils Absolute: 0.1 K/uL (ref 0.0–0.1)
Basophils Relative: 1 %
Eosinophils Absolute: 0.2 K/uL (ref 0.0–0.5)
Eosinophils Relative: 3 %
HCT: 37.6 % (ref 36.0–46.0)
Hemoglobin: 12.3 g/dL (ref 12.0–15.0)
Immature Granulocytes: 1 %
Lymphocytes Relative: 39 %
Lymphs Abs: 2.5 K/uL (ref 0.7–4.0)
MCH: 31.1 pg (ref 26.0–34.0)
MCHC: 32.7 g/dL (ref 30.0–36.0)
MCV: 94.9 fL (ref 80.0–100.0)
Monocytes Absolute: 0.4 K/uL (ref 0.1–1.0)
Monocytes Relative: 7 %
Neutro Abs: 3.2 K/uL (ref 1.7–7.7)
Neutrophils Relative %: 49 %
Platelets: 204 K/uL (ref 150–400)
RBC: 3.96 MIL/uL (ref 3.87–5.11)
RDW: 11.1 % — ABNORMAL LOW (ref 11.5–15.5)
WBC: 6.5 K/uL (ref 4.0–10.5)
nRBC: 0 % (ref 0.0–0.2)

## 2024-04-20 LAB — COMPREHENSIVE METABOLIC PANEL WITH GFR
ALT: 21 U/L (ref 0–44)
AST: 25 U/L (ref 15–41)
Albumin: 4.3 g/dL (ref 3.5–5.0)
Alkaline Phosphatase: 111 U/L (ref 38–126)
Anion gap: 13 (ref 5–15)
BUN: 16 mg/dL (ref 6–20)
CO2: 24 mmol/L (ref 22–32)
Calcium: 8.5 mg/dL — ABNORMAL LOW (ref 8.9–10.3)
Chloride: 104 mmol/L (ref 98–111)
Creatinine, Ser: 0.92 mg/dL (ref 0.44–1.00)
GFR, Estimated: >60 mL/min
Glucose, Bld: 95 mg/dL (ref 70–99)
Potassium: 4.1 mmol/L (ref 3.5–5.1)
Sodium: 141 mmol/L (ref 135–145)
Total Bilirubin: 0.2 mg/dL (ref 0.0–1.2)
Total Protein: 6.9 g/dL (ref 6.5–8.1)

## 2024-04-20 LAB — URINE DRUG SCREEN
Amphetamines: NEGATIVE
Barbiturates: NEGATIVE
Benzodiazepines: POSITIVE — AB
Cocaine: POSITIVE — AB
Fentanyl: NEGATIVE
Methadone Scn, Ur: NEGATIVE
Opiates: NEGATIVE
Tetrahydrocannabinol: NEGATIVE

## 2024-04-20 LAB — URINALYSIS, ROUTINE W REFLEX MICROSCOPIC
Bilirubin Urine: NEGATIVE
Glucose, UA: NEGATIVE mg/dL
Hgb urine dipstick: NEGATIVE
Ketones, ur: NEGATIVE mg/dL
Leukocytes,Ua: NEGATIVE
Nitrite: NEGATIVE
Protein, ur: NEGATIVE mg/dL
Specific Gravity, Urine: 1.01 (ref 1.005–1.030)
pH: 7 (ref 5.0–8.0)

## 2024-04-20 LAB — APTT: aPTT: 26 s (ref 24–36)

## 2024-04-20 LAB — ETHANOL: Alcohol, Ethyl (B): <15 mg/dL

## 2024-04-20 LAB — PROTIME-INR
INR: 0.9 (ref 0.8–1.2)
Prothrombin Time: 12.4 s (ref 11.4–15.2)

## 2024-04-20 LAB — TROPONIN T, HIGH SENSITIVITY
Troponin T High Sensitivity: <6 ng/L (ref 0–19)
Troponin T High Sensitivity: <6 ng/L (ref 0–19)

## 2024-04-20 MED ORDER — ONDANSETRON HCL 4 MG/2ML IJ SOLN: 4.0000 mg | Freq: Four times a day (QID) | INTRAMUSCULAR | Status: DC | PRN

## 2024-04-20 MED ORDER — SODIUM CHLORIDE 0.9 % IV BOLUS
1000.0000 mL | Freq: Once | INTRAVENOUS | Status: AC
  Administered 2024-04-20: 1000 mL via INTRAVENOUS

## 2024-04-20 MED ORDER — LEVOTHYROXINE SODIUM 75 MCG PO TABS
75.0000 ug | ORAL_TABLET | Freq: Every day | ORAL | Status: DC
  Administered 2024-04-21: 75 ug via ORAL
  Filled 2024-04-20: qty 1

## 2024-04-20 MED ORDER — ROSUVASTATIN CALCIUM 20 MG PO TABS
20.0000 mg | ORAL_TABLET | Freq: Every day | ORAL | Status: DC
  Administered 2024-04-21: 20 mg via ORAL
  Filled 2024-04-20: qty 1

## 2024-04-20 MED ORDER — IOHEXOL 350 MG/ML SOLN
100.0000 mL | Freq: Once | INTRAVENOUS | Status: AC | PRN
  Administered 2024-04-20: 100 mL via INTRAVENOUS

## 2024-04-20 MED ORDER — ASPIRIN 325 MG PO TABS
325.0000 mg | ORAL_TABLET | Freq: Once | ORAL | Status: AC
  Administered 2024-04-20: 325 mg via ORAL
  Filled 2024-04-20: qty 1

## 2024-04-20 MED ORDER — SENNOSIDES-DOCUSATE SODIUM 8.6-50 MG PO TABS: 1.0000 | ORAL_TABLET | Freq: Every evening | ORAL | Status: DC | PRN

## 2024-04-20 MED ORDER — ONDANSETRON HCL 4 MG/2ML IJ SOLN
4.0000 mg | Freq: Once | INTRAMUSCULAR | Status: AC
  Administered 2024-04-20: 4 mg via INTRAVENOUS
  Filled 2024-04-20: qty 2

## 2024-04-20 MED ORDER — ASPIRIN 81 MG PO TBEC
81.0000 mg | DELAYED_RELEASE_TABLET | Freq: Every day | ORAL | Status: DC
  Administered 2024-04-21: 81 mg via ORAL
  Filled 2024-04-20: qty 1

## 2024-04-20 MED ORDER — SODIUM CHLORIDE 0.9 % IV BOLUS
250.0000 mL | Freq: Once | INTRAVENOUS | Status: AC
  Administered 2024-04-21: 250 mL via INTRAVENOUS

## 2024-04-20 MED ORDER — ENOXAPARIN SODIUM 40 MG/0.4ML IJ SOSY
40.0000 mg | PREFILLED_SYRINGE | INTRAMUSCULAR | Status: DC
  Administered 2024-04-20: 40 mg via SUBCUTANEOUS
  Filled 2024-04-20: qty 0.4

## 2024-04-20 MED ORDER — STROKE: EARLY STAGES OF RECOVERY BOOK
Freq: Once | Status: AC
  Filled 2024-04-20: qty 1

## 2024-04-20 NOTE — H&P (Signed)
 " History and Physical    [name] FMW:[id number] DOB: March 14, 1971 DOA: 04/20/2024  PCP: Shona Norleen PEDLAR, MD  Patient coming from: Home  I have personally briefly reviewed patient's old medical records in [facility]  Chief Complaint: Vomiting,Weakness  HPI: [name] is a 54 y.o. female with medical history significant for stroke, CHF, cocaine use, hypothyroidism, tardive dyskinesia. Patient presented to the ED with complaints of generalized weakness, vomiting and chest pain that started today.  She reports she vomited about 6 times before she presented to the ED.  No diarrhea.  No abdominal pain.  She also reports she checked her weight, and 12 hours later she had gained 5 pounds.  No abdominal bloating no lower extremity swelling. On arrival to the ED, patient was having difficulty speaking and her right side was weaker than her left.  Family at bedside said patient was trying to talk but was unable, and subsequently she was able to speak but speech was abnormal. She has a history of stroke-presented with abnormal speech, this resolved, with no residual deficits.  Also reported that patient was weak and unable to walk. On my evaluation, patient speech is still abnormal per family but family reports it is better.  ED Course: Code stroke called on arrival to the ED. Temperature 97.4.  Heart rate 50s to 78.  Respiratory rate 13-23.  Blood pressure systolic 93-128. Troponin <6 X 2.  proBNP 51.3. UA not suggestive of UTI. UDS pending. Code stroke called on arrival to the ED, head CT no acute abnormality, remote infarct left frontal periventricular white matter similar to prior. CTA head and neck-no large vessel occlusion.  Evaluated by teleneurologist Dr. Johney for functional component, however given recent stroke history and risk factors recommended admission for stroke workup to include MRI brain.  Review of Systems: As per HPI all other systems reviewed and  negative.  Past Medical History:  Diagnosis Date   Anxiety    CHF (congestive heart failure) (HCC)    Chronic back pain    Chronic neck pain    Complication of anesthesia    pt states her O2 saturation drops after surgery   GERD (gastroesophageal reflux disease)    Headache    migraines- last one 2 months ago   Hypercholesteremia    Hypothyroidism    Kidney stones    Osteoporosis    Reflex sympathetic dystrophy    Stroke (HCC)    Tardive dyskinesia    Thyroid  disease     Past Surgical History:  Procedure Laterality Date   ABDOMINAL HYSTERECTOMY     partial   APPENDECTOMY     BACK SURGERY     plates and screws   BREAST SURGERY Left    fibrocystic tumor   CARPAL TUNNEL RELEASE Right 03/22/2019   Procedure: RIGHT CARPAL TUNNEL RELEASE;  Surgeon: Murrell Drivers, MD;  Location: [facility];  Service: Orthopedics;  Laterality: Right;  NO LOCAL   CHOLECYSTECTOMY     CYST EXCISION Left    foot   EAR CYST EXCISION Right 01/27/2013   Procedure: EXCISION CYST ON SCALP ;  Surgeon: Oneil DELENA Budge, MD;  Location: [facility];  Service: General;  Laterality: Right;   EAR CYST EXCISION Right 01/27/2013   Procedure: CYST REMOVAL right axilla;  Surgeon: Oneil DELENA Budge, MD;  Location: [facility];  Service: General;  Laterality: Right;   KNEE ARTHROSCOPY Left    NECK SURGERY     plate in  neck   SALPINGOOPHORECTOMY Right    STRABISMUS SURGERY Bilateral    THYROIDECTOMY N/A 03/14/2014   Procedure: TOTAL THYROIDECTOMY;  Surgeon: Oneil DELENA Budge, MD;  Location: [facility];  Service: General;  Laterality: N/A;   TRANSESOPHAGEAL ECHOCARDIOGRAM (CATH LAB) N/A 12/31/2023   Procedure: TRANSESOPHAGEAL ECHOCARDIOGRAM;  Surgeon: Jeffrie Oneil BROCKS, MD;  Location: [facility];  Service: Cardiovascular;  Laterality: N/A;     reports that she has been smoking cigarettes. She has a 10 pack-year smoking history. She has never used smokeless tobacco. She reports current alcohol use. She reports that  she does not use drugs.  Allergies[1]  Family History  Problem Relation Age of Onset   Breast cancer Mother    Parkinson's disease Father    Depression Sister    Prior to Admission medications  Medication Sig Start Date End Date Taking? Authorizing Provider  ALPRAZolam  (XANAX ) 1 MG tablet Take 1 mg by mouth 4 (four) times daily.    [provider]  aspirin  EC 81 MG tablet Take 1 tablet (81 mg total) by mouth daily. Swallow whole. 01/01/24   Lehner, Erin C, NP  baclofen  (LIORESAL ) 10 MG tablet Take 10 mg by mouth 3 (three) times daily as needed for muscle spasms.    [provider]  citalopram  (CELEXA ) 40 MG tablet Take 40 mg by mouth daily.    [provider]  levothyroxine  (SYNTHROID ) 75 MCG tablet Take 1 tablet (75 mcg total) by mouth daily before breakfast. 02/14/23   Nida, Gebreselassie W, MD  OVER THE COUNTER MEDICATION Take 1 tablet by mouth 2 (two) times daily. OTC sinus medication    [provider]  rosuvastatin  (CRESTOR ) 20 MG tablet Take 1 tablet (20 mg total) by mouth daily. 01/01/24   Judithe Rocky BROCKS, NP  spironolactone  (ALDACTONE ) 25 MG tablet Take 0.5 tablets (12.5 mg total) by mouth daily. 03/01/24   Duke, Jon Garre, PA  traZODone  (DESYREL ) 50 MG tablet Take 100 mg by mouth at bedtime.    [provider]  venlafaxine  (EFFEXOR ) 37.5 MG tablet Take 37.5 mg by mouth 2 (two) times daily.    [provider]    Physical Exam: Vitals:   04/20/24 1645 04/20/24 1700 04/20/24 1715 04/20/24 1759  BP: 128/77 124/69    Pulse: 63 (!) 57 61   Resp: (!) 21 14 (!) 23   Temp:    98.8 F (37.1 C)  TempSrc:    Oral  SpO2: 98% 98% 99%   Weight:      Height:        Constitutional: NAD, calm, comfortable Vitals:   04/20/24 1645 04/20/24 1700 04/20/24 1715 04/20/24 1759  BP: 128/77 124/69    Pulse: 63 (!) 57 61   Resp: (!) 21 14 (!) 23   Temp:    98.8 F (37.1 C)  TempSrc:    Oral  SpO2: 98% 98% 99%   Weight:      Height:        Eyes: PERRL, lids and conjunctivae normal ENMT: Mucous membranes are moist.  Neck: normal, supple, no masses, no thyromegaly Respiratory: clear to auscultation bilaterally, no wheezing, no crackles. Normal respiratory effort. No accessory muscle use.  Cardiovascular: Regular rate and rhythm, no murmurs / rubs / gallops. No extremity edema. Extremities warm. Abdomen: no tenderness, no masses palpated. No hepatosplenomegaly. Bowel sounds positive.  Musculoskeletal: no clubbing / cyanosis. No joint deformity upper and lower extremities.  Skin: no rashes, lesions, ulcers. No induration  Neurologic:  Neurological:     Mental Status: She is alert.     GCS: GCS eye subscore is 4. GCS verbal subscore is 5. GCS motor subscore is 6.     Comments: Mental Status:  Alert, oriented, thought content appropriate, speech abnormal-but she appears to be stuttering, low effort, slightly low volume of speech.  Cranial Nerves:  II:  Peripheral visual fields grossly normal, pupils equal, round, reactive to light III,IV, VI: ptosis not present, extra-ocular motions intact bilaterally  V,VII: smile symmetric, eyebrows raise symmetric, facial light touch sensation equal VIII: hearing grossly normal to voice  XII: midline tongue extension without fassiculations Motor:  Normal tone.   5 out of 5 strength of bilateral upper and lower extremity, with good and equal grip strength bilaterally CV: distal pulses palpable throughout   Psychiatric: Normal judgment and insight. Alert and oriented x 3. Normal mood.   Labs on Admission: I have personally reviewed following labs and imaging studies  CBC: Recent Labs  Lab 04/20/24 1423 04/20/24 1525  WBC 6.5  --   NEUTROABS 3.2  --   HGB 12.3 12.6  HCT 37.6 37.0  MCV 94.9  --   PLT 204  --    Basic Metabolic Panel: Recent Labs  Lab 04/20/24 1423 04/20/24 1525  NA 141 140  K 4.1 4.0  CL 104 103  CO2 24  --   GLUCOSE 95 91  BUN 16 17  CREATININE 0.92  1.00  CALCIUM  8.5*  --    GFR: Estimated Creatinine Clearance: 76.2 mL/min (by C-G formula based on SCr of 1 mg/dL). Liver Function Tests: Recent Labs  Lab 04/20/24 1423  AST 25  ALT 21  ALKPHOS 111  BILITOT 0.2  PROT 6.9  ALBUMIN 4.3   Coagulation Profile: Recent Labs  Lab 04/20/24 1524  INR 0.9   Cardiac Enzymes: No results for input(s): CKTOTAL, CKMB, CKMBINDEX, TROPONINI in the last 168 hours. BNP (last 3 results) Recent Labs    04/20/24 1423  PROBNP 51.3   Urine analysis:    Component Value Date/Time   COLORURINE STRAW (A) 04/20/2024 1614   APPEARANCEUR CLEAR 04/20/2024 1614   LABSPEC 1.010 04/20/2024 1614   PHURINE 7.0 04/20/2024 1614   GLUCOSEU NEGATIVE 04/20/2024 1614   HGBUR NEGATIVE 04/20/2024 1614   BILIRUBINUR NEGATIVE 04/20/2024 1614   KETONESUR NEGATIVE 04/20/2024 1614   PROTEINUR NEGATIVE 04/20/2024 1614   UROBILINOGEN 0.2 01/10/2014 2014   NITRITE NEGATIVE 04/20/2024 1614   LEUKOCYTESUR NEGATIVE 04/20/2024 1614    Radiological Exams on Admission: CT ANGIO HEAD NECK W WO CM W PERF (CODE STROKE) Result Date: 04/20/2024 EXAM: CTA Head and Neck with Perfusion 04/20/2024 04:09:07 PM TECHNIQUE: CTA of the head and neck was performed with the administration of intravenous contrast. 3D postprocessing with multiplanar reconstructions and MIPs was performed to evaluate the vascular anatomy. Cerebral perfusion analysis using computed tomography with contrast administration, including post-processing of parametric maps with determination of cerebral blood flow, cerebral blood volume, mean transit time and time-to-maximum. Automated exposure control, iterative reconstruction, and/or weight based adjustment of the mA/kV was utilized to reduce the radiation dose to as low as reasonably achievable. COMPARISON: Earlier same day head CT CLINICAL HISTORY: Neuro deficit, acute, stroke suspected FINDINGS: CTA NECK: AORTIC ARCH AND ARCH VESSELS: No dissection or  arterial injury. No significant stenosis of the brachiocephalic or subclavian arteries. CERVICAL CAROTID ARTERIES: No dissection, arterial injury, or hemodynamically significant stenosis by NASCET criteria. CERVICAL VERTEBRAL ARTERIES: Focal atherosclerosis along the  V1 segment of the left vertebral artery without significant stenosis. No dissection or arterial injury. LUNGS AND MEDIASTINUM: There are areas of paraseptal and centrilobular emphysema within the lung apices. SOFT TISSUES: No acute abnormality. BONES: Anterior cervical fusion hardware at C5-C6. Mild degenerative changes in the visualized spine. Edentulous maxilla and mandible. CTA HEAD: ANTERIOR CIRCULATION: No significant stenosis of the internal carotid arteries. No significant stenosis of the anterior cerebral arteries. No significant stenosis of the middle cerebral arteries. No aneurysm. POSTERIOR CIRCULATION: No significant stenosis of the posterior cerebral arteries. Fetal origin of the left PCA. No significant stenosis of the basilar artery. No significant stenosis of the vertebral arteries. No aneurysm. OTHER: No dural venous sinus thrombosis on this non-dedicated study. CT PERFUSION: EXAM QUALITY: Exam quality is adequate with diagnostic perfusion maps. No significant motion artifact. Appropriate arterial inflow and venous outflow curves. CORE INFARCT (CBF<30% volume): 0 mL TOTAL HYPOPERFUSION (Tmax>6s volume): 0 mL PENUMBRA: Mismatch volume: 0 mL Mismatch ratio: not applicable Location: not applicable IMPRESSION: 1. No acute large vessel occlusion. 2. No hemodynamically significant stenosis or aneurysm of the arteries in the head or neck. 3. No evidence of ischemia by CT brain perfusion. 4. Pulmonary emphysema is an independent risk factor for lung cancer. Recommend consideration for evaluation for a low-dose CT lung cancer screening program. Electronically signed by: Donnice Mania MD 04/20/2024 04:29 PM EST RP Workstation: [id number]   CT  HEAD CODE STROKE WO CONTRAST (LKW 0-4.5h, LVO 0-24h) Result Date: 04/20/2024 EXAM: CT HEAD WITHOUT CONTRAST 04/20/2024 03:37:38 PM TECHNIQUE: CT of the head was performed without the administration of intravenous contrast. Automated exposure control, iterative reconstruction, and/or weight based adjustment of the mA/kV was utilized to reduce the radiation dose to as low as reasonably achievable. COMPARISON: 12/29/2023 CLINICAL HISTORY: Neuro deficit, acute, stroke suspected. FINDINGS: BRAIN AND VENTRICLES: No acute hemorrhage. No evidence of acute infarct. No hydrocephalus. No extra-axial collection. No mass effect or midline shift. Similar appearance of mild chronic microvascular ischemic changes remote infarct in the left frontal periventricular white matter similar to prior. [place] stroke program early CT (ASPECT) score: Ganglionic (caudate, IC, lentiform nucleus, insula, M1-M3): 7. Supraganglionic (M4-M6): 3. Total: 10. ORBITS: No acute abnormality. SINUSES: No acute abnormality. SOFT TISSUES AND SKULL: No acute soft tissue abnormality. No skull fracture. IMPRESSION: 1. No acute intracranial abnormality. 2. Mild chronic microvascular ischemic changes and remote infarct in the left frontal periventricular white matter, similar to prior. 3. ASPECTS 10. 4. Findings discussed by telephone with Dr. Dean at 4:38 PM on 04/20/24. Electronically signed by: Donnice Mania MD 04/20/2024 03:49 PM EST RP Workstation: [id number]   DG Chest 2 View Result Date: 04/20/2024 CLINICAL DATA:  Chest pain EXAM: CHEST - 2 VIEW COMPARISON:  December 17, 2012 FINDINGS: The heart size and mediastinal contours are within normal limits. Minimal bibasilar subsegmental atelectasis is noted. The visualized skeletal structures are unremarkable. IMPRESSION: Minimal bibasilar subsegmental atelectasis. Electronically Signed   By: Lynwood Landy Raddle M.D.   On: 04/20/2024 14:16   EKG: Independently reviewed.  Sinus rhythm, rate 78, QTc 453.   No significant change from prior.  Assessment/Plan Principal Problem:   Stroke-like symptoms Active Problems:   Postsurgical hypothyroidism   Stroke determined by clinical assessment (HCC)   Cocaine abuse (HCC)  Assessment and Plan:  Strokelike symptoms-abnormal speech started on arrival to ED, reported weakness R> L.  On my evaluation she has more of a stuttering speech and low-volume- ?? Poor effort?  Otherwise no focal deficits on my  evaluation, good and equal strength of bilateral upper and lower extremity.  Admitted for left MCA small cortical infarcts and was given TNK 12/28/2023, presented with mild dysarthria at that time. - Code stroke called in the ED, evaluated by teleneurologist Dr. Jerri, presentation concerning for functional component.  Recommended admission for stroke workup. - MRI brain negative for acute abnormality - Echocardiogram, lipid panel, hemoglobin A1c - PT speech therapy eval - Aspirin  81 mg daily - Neurology is also recommended considering Plavix  300 mg load if passed swallow for manage stroke/TIA. - Resume Crestor   Chronic congestive heart failure-appears stable and compensated.  proBNP 51.3.  Chest x-ray clear.  TEE 12/31/2023 showed EF of 45 to 50%.  Not on diuretics.   - She reported vomiting prior to arrival.  1 L bolus given.  Holding further fluids for now  History of cocaine use - UDS pending  Stroke history- 12/2023. received TNK.  No residual deficits - Resume aspirin , Crestor   Hypothyroidism -Resume Synthroid    DVT prophylaxis: Lovenox  Code Status: FULL Family Communication: None at bedside Disposition Plan: ~ 2 days Consults called: None Admission status: Obs tele  I certify that at the point of admission it is my clinical judgment that the patient will require inpatient hospital care spanning beyond 2 midnights from the point of admission due to high intensity of service, high risk for further deterioration and high frequency of surveillance  required.   Author: Tully FORBES Carwin, MD 04/20/2024 7:38 PM  For on call review [email or web address].      [1]  Allergies Allergen Reactions   Darvocet [Propoxyphene N-Acetaminophen ] Hives   Lyrica [Pregabalin] Swelling   "

## 2024-04-20 NOTE — Telephone Encounter (Signed)
" ° °  Pt c/o of Chest Pain: STAT if active CP, including tightness, pressure, jaw pain, radiating pain to shoulder/upper arm/back, CP unrelieved by Nitro. Symptoms reported of SOB, nausea, vomiting, sweating.  1. Are you having CP right now? Yes     2. Are you experiencing any other symptoms (ex. SOB, nausea, vomiting, sweating)? chest pain, dry mouth, nausea and fatigue   3. Is your CP continuous or coming and going? Coming and going    4. Have you taken Nitroglycerin? No    5. How long have you been experiencing CP? Today     6. If NO CP at time of call then end call with telling Pt to call back or call 911 if Chest pain returns prior to return call from triage team.   If swelling, where is the swelling located? Hands and ankles   How much weight have you gained and in what time span? 5 pounds today   Have you gained 2 pounds in a day or 5 pounds in a week? Yes   Do you have a log of your daily weights (if so, list)? Yes   Are you currently taking a fluid pill? Yes   Are you currently SOB? No   Have you traveled recently in a car or plane for an extended period of time?  Pt calling stating she woke up feeling very sick and has gained 5 pounds in 12 hr span. She is nervous she will have another stroke. Please advise   "

## 2024-04-20 NOTE — Telephone Encounter (Signed)
 Stat call into triage. Pt states she feels awful, has been throwing up all night. Pt states she has swelling and has gained 5 pounds since yesterday. Pt is experiencing chest pain and was tearful on the phone. Advised pt to call 911 as I did not want her to drive in this state and her son was not at home. Pt stated she would call 911.

## 2024-04-20 NOTE — ED Notes (Addendum)
 SABRA

## 2024-04-20 NOTE — ED Triage Notes (Signed)
 Pt arrived via POV from home with multiple complaints including weakness, nausea, vomiting, headache, chest pain and reports recent Hx of a stroke.

## 2024-04-20 NOTE — ED Triage Notes (Signed)
 Pt also reports recent weight gain and reports calling her cardiologist who advised her to come to the ER for evaluation of possible fluid retention and heart failure.

## 2024-04-20 NOTE — Consult Note (Signed)
 " [facility] Telemedicine Consult   Requesting Provider: Dr Dean  Chief Complaint: code stroke  HPI: 54 yo Floyd with hx of HTN, HLD, cocaine use, smoker and tardive dyskinesia, recent stroke in 12/2023 presented to ED for code stroke.  Pt was very dysarthric and stuttering of the speech but was able to tell me the story. She stated that around 6:30 to 7am, she woke up and went to weigh herself. It was 181lb which was increased from her previous day. She felt sick, with chest pain, headache. She normally has similar feeling when she is anxious. She called her cardiology and reported weight gain and her cardiologist asked her to go to ER. She had nobody at home to take her to ER so she waited at home. She started to have whole body weakness and hard to speak. When family found her at home around 1pm, they called EMS and pt was sent to ED. Family stated that pt initially able to get out of her house with help but then worsened to become too weak and not able to walk to the EMS truck. On my encounter with her, she was awake but dysarthric with stuttering speech, hypophonia, moving all extremities but whole body weakness including b/l face.    She was admitted in 12/2023 for small left temporoccipital infarct. 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 MRA neck also concerning the same but indeterminate and recommend to repeat image in 3-6 months. EF 60-65%. LDL 120, A1C 5.0. UDS showed cocaine positive. TEE no thrombus, LE venous doppler no DVT and TCD bubble study negative. Discharged on DAPT for 3 weeks and then ASA alone as well as crestor  20. After discharge followed with cardiology for mild cardiomyopathy. On low dose spironolactone .  LKW: 6:30am TNK given?: No, outside window, likely not stroke IR Thrombectomy? No, no LVO Modified Rankin Scale: 2-Slight disability-UNABLE to perform all activities but does not need  assistance   Exam: Vitals:   04/20/24 1355  BP: 117/73  Pulse: 78  Resp: 16  Temp: (!) 97.4 Floyd (36.3 C)  SpO2: 98%     Temp:  [97.4 Floyd (36.3 C)] 97.4 Floyd (36.3 C) (01/20 1355) Pulse Rate:  [78] 78 (01/20 1355) Resp:  [16] 16 (01/20 1355) BP: (117)/(73) 117/73 (01/20 1355) SpO2:  [98 %] 98 % (01/20 1355) Weight:  [82.1 kg] 82.1 kg (01/20 1350)  General - Well nourished, well developed, in no apparent distress.  Ophthalmologic - fundi not visualized due to noncooperation.  Cardiovascular - Regular rhythm and rate.  Neuro - awake, alert, eyes open, orientated to age, place, time, but very dysarthric with stuttering speech, hypophonia. No aphasia but paucity of speech, following all simple commands. Able to name and read slow in dysarthric and stuttering speech. No gaze palsy, tracking bilaterally, visual field full. B/l facial weakness not quite move mouth on command. Tongue not protruding out per request. Bilateral UEs 3/5, drift to bed beyond 10 sec. Bilaterally LEs 3/5, drift to bed beyond 5 secs but symmetrical bilaterally. Sensation decreased on the R, b/l FTN intact grossly but very slow bilaterally, gait not tested.     NIH Stroke Scale  Level Of Consciousness 0=Alert; keenly responsive 1=Arouse to minor stimulation 2=Requires repeated stimulation to arouse or movements to pain 3=postures or unresponsive 0  LOC Questions to Month and Age 66=Answers both questions correctly 1=Answers one question correctly or dysarthria/intubated/trauma/language barrier 2=Answers neither  question correctly or aphasia 0  LOC Commands      -Open/Close eyes     -Open/close grip     -Pantomime commands if communication barrier 0=Performs both tasks correctly 1=Performs one task correctly 2=Performs neighter task correctly 0  Best Gaze     -Only assess horizontal gaze 0=Normal 1=Partial gaze palsy 2=Forced deviation, or total gaze paresis 0  Visual 0=No visual loss 1=Partial  hemianopia 2=Complete hemianopia 3=Bilateral hemianopia (blind including cortical blindness) 0  Facial Palsy     -Use grimace if obtunded 0=Normal symmetrical movement 1=Minor paralysis (asymmetry) 2=Partial paralysis (lower face) 3=Complete paralysis (upper and lower face) 0  Motor  0=No drift for 10/5 seconds 1=Drift, but does not hit bed 2=Some antigravity effort, hits  bed 3=No effort against gravity, limb falls 4=No movement 0=Amputation/joint fusion Right Arm 1     Leg 1    Left Arm 1     Leg 1  Limb Ataxia     - FNT/HTS 0=Absent or does not understand or paralyzed or amputation/joint fusion 1=Present in one limb 2=Present in two limbs 0  Sensory 0=Normal 1=Mild to moderate sensory loss 2=Severe to total sensory loss or coma/unresponsive 1  Best Language 0=No aphasia, normal 1=Mild to moderate aphasia 2=Severe aphasia 3=Mute, global aphasia, or coma/unresponsive 1  Dysarthria 0=Normal 1=Mild to moderate 2=Severe, unintelligible or mute/anarthric 0=intubated/unable to test 2  Extinction/Neglect 0=No abnormality 1=visual/tactile/auditory/spatia/personal inattention/Extinction to bilateral simultaneous stimulation 2=Profound neglect/extinction more than 1 modality  0  Total   8      Imaging Reviewed:  CT HEAD CODE STROKE WO CONTRAST (LKW 0-4.5h, LVO 0-24h) Result Date: 04/20/2024 EXAM: CT HEAD WITHOUT CONTRAST 04/20/2024 03:37:38 PM TECHNIQUE: CT of the head was performed without the administration of intravenous contrast. Automated exposure control, iterative reconstruction, and/or weight based adjustment of the mA/kV was utilized to reduce the radiation dose to as low as reasonably achievable. COMPARISON: 12/29/2023 CLINICAL HISTORY: Neuro deficit, acute, stroke suspected. FINDINGS: BRAIN AND VENTRICLES: No acute hemorrhage. No evidence of acute infarct. No hydrocephalus. No extra-axial collection. No mass effect or midline shift. Similar appearance of mild chronic  microvascular ischemic changes remote infarct in the left frontal periventricular white matter similar to prior. [place] stroke program early CT (ASPECT) score: Ganglionic (caudate, IC, lentiform nucleus, insula, M1-M3): 7. Supraganglionic (M4-M6): 3. Total: 10. ORBITS: No acute abnormality. SINUSES: No acute abnormality. SOFT TISSUES AND SKULL: No acute soft tissue abnormality. No skull fracture. IMPRESSION: 1. No acute intracranial abnormality. 2. Mild chronic microvascular ischemic changes and remote infarct in the left frontal periventricular white matter, similar to prior. 3. ASPECTS 10. 4. Findings discussed by telephone with Dr. Dean at 4:38 PM on 04/20/24. Electronically signed by: Donnice Mania MD 04/20/2024 03:49 PM EST RP Workstation: [id number]   DG Chest 2 View Result Date: 04/20/2024 CLINICAL DATA:  Chest pain EXAM: CHEST - 2 VIEW COMPARISON:  December 17, 2012 FINDINGS: The heart size and mediastinal contours are within normal limits. Minimal bibasilar subsegmental atelectasis is noted. The visualized skeletal structures are unremarkable. IMPRESSION: Minimal bibasilar subsegmental atelectasis. Electronically Signed   By: Lynwood Landy Raddle M.D.   On: 04/20/2024 14:16     Labs reviewed in epic and pertinent values follow: Cre 0.92, platelet 204  Assessment:  54 yo Floyd with hx of HTN, HLD, cocaine use, smoker and tardive dyskinesia, recent stroke in 12/2023 presented to ED for feeling sick, with chest pain, headache, whole body weakness and hard to speak. LSW around 6:30am, NIHSS =  8 due to dysarthric with stuttering speech, hypophonia, effort related whole body weakness including b/l face. CT no acute finding. CTA head and neck no LVO per my preliminary read. Presentation concerning for functional component. However, given pt recent stroke hx and stroke risk factors, recommend further stroke work up.    Recommendations:  Recommend admission vs. Observation to continue further stroke work  up  Frequent neuro checks Telemetry monitoring MRI brain  Echocardiogram  UDS, fasting lipid panel and HgbA1C PT/OT/speech consult Permissive hypertension (only treat if BP > 220/120 unless a lower blood pressure is clinically necessary) for 24-48 hours post stroke onset GI and DVT prophylaxis  ASA PR 300 mg if no po access or ASA 81 mg if passed swallow screening or has po access also consider plavix  300mg  load if passed swallow for minor stroke / TIA Stroke risk factor modification Discussed with Dr. Dean ED physician We will follow    Consult Participants: pt, me, RN Location of the provider: [facility] Location of the patient: [facility]  Time Code Stroke Page received:  3:20pm Time neurologist arrived:  3;22pm Time NIHSS completed: 3:45pm    This consult was provided via telemedicine with 2-way video and audio communication. The patient/family was informed that care would be provided in this way and agreed to receive care in this manner.    Ary Cummins, MD PhD Stroke Neurology 04/20/2024 3:33 PM    "

## 2024-04-20 NOTE — ED Provider Notes (Signed)
 " [place] [facility] AT [facility] Provider Note   CSN: [id number] Arrival date & time: 04/20/24  1343  An emergency department physician performed an initial assessment on this suspected stroke patient at 1515.  Patient presents with: Weakness   [name] is a 54 y.o. female.   Pt is a 54 yo female with pmhx significant for anxiety, RDS, chronic neck and back pain, HLD, hypothyroidism, kidney stones, CHF, substance abuse, and CVA.  Pt initially came in today because of concern for CHF.  She was put on a diuretic a few months ago, but it made her bp drop.  She was taken off it.  She said she's gained weight and called cards.  They told her that she needed to come to the ED.  She has had some vomiting and headache.  Pt's family said she was normal at 1330.  When she arrived, she developed slurred speech and weakness all over, but R > L.  She presented in a similar fashion in September when she had a MCA CVA.  Code stroke called upon my evaluation of patient in the room when I arrived on shift.       Prior to Admission medications  Medication Sig Start Date End Date Taking? Authorizing Provider  ALPRAZolam  (XANAX ) 1 MG tablet Take 1 mg by mouth 4 (four) times daily.    [provider]  aspirin  EC 81 MG tablet Take 1 tablet (81 mg total) by mouth daily. Swallow whole. 01/01/24   Lehner, Erin C, NP  baclofen  (LIORESAL ) 10 MG tablet Take 10 mg by mouth 3 (three) times daily as needed for muscle spasms.    [provider]  citalopram  (CELEXA ) 40 MG tablet Take 40 mg by mouth daily.    [provider]  levothyroxine  (SYNTHROID ) 75 MCG tablet Take 1 tablet (75 mcg total) by mouth daily before breakfast. 02/14/23   Nida, Gebreselassie W, MD  OVER THE COUNTER MEDICATION Take 1 tablet by mouth 2 (two) times daily. OTC sinus medication    [provider]  rosuvastatin  (CRESTOR ) 20 MG tablet Take 1 tablet (20 mg total) by mouth daily.  01/01/24   Judithe Rocky BROCKS, NP  spironolactone  (ALDACTONE ) 25 MG tablet Take 0.5 tablets (12.5 mg total) by mouth daily. 03/01/24   Duke, Jon Garre, PA  traZODone  (DESYREL ) 50 MG tablet Take 100 mg by mouth at bedtime.    [provider]  venlafaxine  (EFFEXOR ) 37.5 MG tablet Take 37.5 mg by mouth 2 (two) times daily.    [provider]    Allergies: Darvocet [propoxyphene n-acetaminophen ] and Lyrica [pregabalin]    Review of Systems  Neurological:  Positive for speech difficulty and weakness.  All other systems reviewed and are negative.   Updated Vital Signs BP 124/69   Pulse 61   Temp (!) 97.4 F (36.3 C) (Temporal)   Resp (!) 23   Ht 5' 9.5 (1.765 m)   Wt 82.1 kg   SpO2 99%   BMI 26.35 kg/m   Physical Exam Vitals and nursing note reviewed.  Constitutional:      Appearance: Normal appearance.  HENT:     Head: Normocephalic and atraumatic.     Right Ear: External ear normal.     Left Ear: External ear normal.     Nose: Nose normal.     Mouth/Throat:     Mouth: Mucous membranes are moist.     Pharynx: Oropharynx is clear.  Eyes:  Extraocular Movements: Extraocular movements intact.     Conjunctiva/sclera: Conjunctivae normal.     Pupils: Pupils are equal, round, and reactive to light.  Cardiovascular:     Rate and Rhythm: Normal rate and regular rhythm.     Pulses: Normal pulses.     Heart sounds: Normal heart sounds.  Pulmonary:     Effort: Pulmonary effort is normal.     Breath sounds: Normal breath sounds.  Abdominal:     General: Abdomen is flat. Bowel sounds are normal.     Palpations: Abdomen is soft.  Musculoskeletal:        General: Normal range of motion.     Cervical back: Normal range of motion and neck supple.  Skin:    General: Skin is warm.     Capillary Refill: Capillary refill takes less than 2 seconds.  Neurological:     Mental Status: She is alert and oriented to person, place, and time.     Comments: Pt has  significant dysarthria; she is unable to move RLE     (all labs ordered are listed, but only abnormal results are displayed) Labs Reviewed  CBC WITH DIFFERENTIAL/PLATELET - Abnormal; Notable for the following components:      Result Value   RDW 11.1 (*)    All other components within normal limits  COMPREHENSIVE METABOLIC PANEL WITH GFR - Abnormal; Notable for the following components:   Calcium  8.5 (*)    All other components within normal limits  URINALYSIS, ROUTINE W REFLEX MICROSCOPIC - Abnormal; Notable for the following components:   Color, Urine STRAW (*)    All other components within normal limits  I-STAT CHEM 8, ED - Abnormal; Notable for the following components:   Calcium , Ion 1.08 (*)    All other components within normal limits  PRO BRAIN NATRIURETIC PEPTIDE  PROTIME-INR  APTT  ETHANOL  URINE DRUG SCREEN  TROPONIN T, HIGH SENSITIVITY  TROPONIN T, HIGH SENSITIVITY    EKG: EKG Interpretation Date/Time:  Tuesday April 20 2024 13:51:31 EST Ventricular Rate:  78 PR Interval:  130 QRS Duration:  80 QT Interval:  398 QTC Calculation: 453 R Axis:   40  Text Interpretation: Normal sinus rhythm Normal ECG When compared with ECG of 28-Dec-2023 15:29, PREVIOUS ECG IS PRESENT No significant change since last tracing Confirmed by Dean Clarity [phone]) on 04/20/2024 3:02:47 PM  Radiology: CT ANGIO HEAD NECK W WO CM W PERF (CODE STROKE) Result Date: 04/20/2024 EXAM: CTA Head and Neck with Perfusion 04/20/2024 04:09:07 PM TECHNIQUE: CTA of the head and neck was performed with the administration of intravenous contrast. 3D postprocessing with multiplanar reconstructions and MIPs was performed to evaluate the vascular anatomy. Cerebral perfusion analysis using computed tomography with contrast administration, including post-processing of parametric maps with determination of cerebral blood flow, cerebral blood volume, mean transit time and time-to-maximum. Automated exposure  control, iterative reconstruction, and/or weight based adjustment of the mA/kV was utilized to reduce the radiation dose to as low as reasonably achievable. COMPARISON: Earlier same day head CT CLINICAL HISTORY: Neuro deficit, acute, stroke suspected FINDINGS: CTA NECK: AORTIC ARCH AND ARCH VESSELS: No dissection or arterial injury. No significant stenosis of the brachiocephalic or subclavian arteries. CERVICAL CAROTID ARTERIES: No dissection, arterial injury, or hemodynamically significant stenosis by NASCET criteria. CERVICAL VERTEBRAL ARTERIES: Focal atherosclerosis along the V1 segment of the left vertebral artery without significant stenosis. No dissection or arterial injury. LUNGS AND MEDIASTINUM: There are areas of paraseptal and centrilobular emphysema within  the lung apices. SOFT TISSUES: No acute abnormality. BONES: Anterior cervical fusion hardware at C5-C6. Mild degenerative changes in the visualized spine. Edentulous maxilla and mandible. CTA HEAD: ANTERIOR CIRCULATION: No significant stenosis of the internal carotid arteries. No significant stenosis of the anterior cerebral arteries. No significant stenosis of the middle cerebral arteries. No aneurysm. POSTERIOR CIRCULATION: No significant stenosis of the posterior cerebral arteries. Fetal origin of the left PCA. No significant stenosis of the basilar artery. No significant stenosis of the vertebral arteries. No aneurysm. OTHER: No dural venous sinus thrombosis on this non-dedicated study. CT PERFUSION: EXAM QUALITY: Exam quality is adequate with diagnostic perfusion maps. No significant motion artifact. Appropriate arterial inflow and venous outflow curves. CORE INFARCT (CBF<30% volume): 0 mL TOTAL HYPOPERFUSION (Tmax>6s volume): 0 mL PENUMBRA: Mismatch volume: 0 mL Mismatch ratio: not applicable Location: not applicable IMPRESSION: 1. No acute large vessel occlusion. 2. No hemodynamically significant stenosis or aneurysm of the arteries in the head  or neck. 3. No evidence of ischemia by CT brain perfusion. 4. Pulmonary emphysema is an independent risk factor for lung cancer. Recommend consideration for evaluation for a low-dose CT lung cancer screening program. Electronically signed by: Donnice Mania MD 04/20/2024 04:29 PM EST RP Workstation: [id number]   CT HEAD CODE STROKE WO CONTRAST (LKW 0-4.5h, LVO 0-24h) Result Date: 04/20/2024 EXAM: CT HEAD WITHOUT CONTRAST 04/20/2024 03:37:38 PM TECHNIQUE: CT of the head was performed without the administration of intravenous contrast. Automated exposure control, iterative reconstruction, and/or weight based adjustment of the mA/kV was utilized to reduce the radiation dose to as low as reasonably achievable. COMPARISON: 12/29/2023 CLINICAL HISTORY: Neuro deficit, acute, stroke suspected. FINDINGS: BRAIN AND VENTRICLES: No acute hemorrhage. No evidence of acute infarct. No hydrocephalus. No extra-axial collection. No mass effect or midline shift. Similar appearance of mild chronic microvascular ischemic changes remote infarct in the left frontal periventricular white matter similar to prior. [place] stroke program early CT (ASPECT) score: Ganglionic (caudate, IC, lentiform nucleus, insula, M1-M3): 7. Supraganglionic (M4-M6): 3. Total: 10. ORBITS: No acute abnormality. SINUSES: No acute abnormality. SOFT TISSUES AND SKULL: No acute soft tissue abnormality. No skull fracture. IMPRESSION: 1. No acute intracranial abnormality. 2. Mild chronic microvascular ischemic changes and remote infarct in the left frontal periventricular white matter, similar to prior. 3. ASPECTS 10. 4. Findings discussed by telephone with Dr. Dean at 4:38 PM on 04/20/24. Electronically signed by: Donnice Mania MD 04/20/2024 03:49 PM EST RP Workstation: [id number]   DG Chest 2 View Result Date: 04/20/2024 CLINICAL DATA:  Chest pain EXAM: CHEST - 2 VIEW COMPARISON:  December 17, 2012 FINDINGS: The heart size and mediastinal contours are within  normal limits. Minimal bibasilar subsegmental atelectasis is noted. The visualized skeletal structures are unremarkable. IMPRESSION: Minimal bibasilar subsegmental atelectasis. Electronically Signed   By: Lynwood Landy Raddle M.D.   On: 04/20/2024 14:16     Procedures   Medications Ordered in the ED  iohexol  (OMNIPAQUE ) 350 MG/ML injection 100 mL (100 mLs Intravenous Contrast Given 04/20/24 1548)  sodium chloride  0.9 % bolus 1,000 mL (1,000 mLs Intravenous Bolus from Bag 04/20/24 1628)  ondansetron  (ZOFRAN ) injection 4 mg (4 mg Intravenous Given 04/20/24 1628)                                    Medical Decision Making Amount and/or Complexity of Data Reviewed Labs: ordered. Radiology: ordered.  Risk Prescription drug management. Decision regarding hospitalization.  This patient presents to the ED for concern of weakness, this involves an extensive number of treatment options, and is a complaint that carries with it a high risk of complications and morbidity.  The differential diagnosis includes cva, tia, chf, cardiac event, dehydration, electrolyte abn   Co morbidities that complicate the patient evaluation  anxiety, RDS, chronic neck and back pain, HLD, hypothyroidism, kidney stones, CHF, substance abuse, and CVA   Additional history obtained:  Additional history obtained from epic chart review External records from outside source obtained and reviewed including family   Lab Tests:  I Ordered, and personally interpreted labs.  The pertinent results include:  cbc nl, cmp nl, trop nl, bnp nl, inr nl   Imaging Studies ordered:  I ordered imaging studies including ct head, cta head/neck, cxr  I independently visualized and interpreted imaging which showed  CXR: Minimal bibasilar subsegmental atelectasis.  CT head:  No acute intracranial abnormality.  2. Mild chronic microvascular ischemic changes and remote infarct in the left  frontal periventricular white matter, similar  to prior.  CTA head/neck: No acute large vessel occlusion.  2. No hemodynamically significant stenosis or aneurysm of the arteries in the  head or neck.  3. No evidence of ischemia by CT brain perfusion.  4. Pulmonary emphysema is an independent risk factor for lung cancer. Recommend  consideration for evaluation for a low-dose CT lung cancer screening program.   I agree with the radiologist interpretation   Cardiac Monitoring:  The patient was maintained on a cardiac monitor.  I personally viewed and interpreted the cardiac monitored which showed an underlying rhythm of: nsr   Medicines ordered and prescription drug management:  I ordered medication including ivfs/zofran   for sx  Reevaluation of the patient after these medicines showed that the patient improved I have reviewed the patients home medicines and have made adjustments as needed   Test Considered:  Ct, cta   Critical Interventions:  Code stroke   Consultations Obtained:  I requested consultation with the neurologist (Dr. Jerri),  and discussed lab and imaging findings as well as pertinent plan - he recommends hospitalist adm for work up Pt d/w Dr. CHARLENA Carwin ([facility]) who will admit   Problem List / ED Course:  Weakness:  functional vs CVA Weight gain:  no evidence of volume overload on exam.  BNP neg and CXR clear.   Reevaluation:  After the interventions noted above, I reevaluated the patient and found that they have :improved   Social Determinants of Health:  Lives with son   Dispostion:  After consideration of the diagnostic results and the patients response to treatment, I feel that the patent would benefit from admission.  CRITICAL CARE Performed by: Mliss Boyers   Total critical care time: 30 minutes  Critical care time was exclusive of separately billable procedures and treating other patients.  Critical care was necessary to treat or prevent imminent or life-threatening  deterioration.  Critical care was time spent personally by me on the following activities: development of treatment plan with patient and/or surrogate as well as nursing, discussions with consultants, evaluation of patient's response to treatment, examination of patient, obtaining history from patient or surrogate, ordering and performing treatments and interventions, ordering and review of laboratory studies, ordering and review of radiographic studies, pulse oximetry and re-evaluation of patient's condition.        Final diagnoses:  Weakness    ED Discharge Orders     None  Dean Clarity, MD 04/20/24 1742  "

## 2024-04-21 ENCOUNTER — Other Ambulatory Visit (HOSPITAL_COMMUNITY): Payer: Self-pay | Admitting: *Deleted

## 2024-04-21 ENCOUNTER — Observation Stay (HOSPITAL_COMMUNITY)

## 2024-04-21 DIAGNOSIS — G459 Transient cerebral ischemic attack, unspecified: Secondary | ICD-10-CM | POA: Diagnosis not present

## 2024-04-21 LAB — LIPID PANEL
Cholesterol: 119 mg/dL (ref 0–200)
HDL: 57 mg/dL
LDL Cholesterol: 43 mg/dL (ref 0–99)
Total CHOL/HDL Ratio: 2.1 ratio
Triglycerides: 95 mg/dL
VLDL: 19 mg/dL (ref 0–40)

## 2024-04-21 LAB — HEMOGLOBIN A1C
Hgb A1c MFr Bld: 5.3 % (ref 4.8–5.6)
Mean Plasma Glucose: 105.41 mg/dL

## 2024-04-21 LAB — ECHOCARDIOGRAM LIMITED
Height: 69.5 in
S' Lateral: 2.8 cm
Weight: 3008 [oz_av]

## 2024-04-21 MED ORDER — SPIRONOLACTONE 25 MG PO TABS: 12.5000 mg | ORAL_TABLET | Freq: Every day | ORAL | 2 refills | Status: DC

## 2024-04-21 MED ORDER — PNEUMOCOCCAL 20-VAL CONJ VACC 0.5 ML IM SUSY: 0.5000 mL | PREFILLED_SYRINGE | INTRAMUSCULAR | Status: DC

## 2024-04-21 MED ORDER — ROSUVASTATIN CALCIUM 20 MG PO TABS: 20.0000 mg | ORAL_TABLET | Freq: Every day | ORAL | 3 refills | Status: AC

## 2024-04-21 MED ORDER — ACETAMINOPHEN 325 MG PO TABS: 650.0000 mg | ORAL_TABLET | Freq: Four times a day (QID) | ORAL | Status: AC | PRN

## 2024-04-21 MED ORDER — SODIUM CHLORIDE 0.9 % IV BOLUS
500.0000 mL | Freq: Once | INTRAVENOUS | Status: AC
  Administered 2024-04-21: 500 mL via INTRAVENOUS

## 2024-04-21 MED ORDER — INFLUENZA VIRUS VACC SPLIT PF (FLUZONE) 0.5 ML IM SUSY: 0.5000 mL | PREFILLED_SYRINGE | INTRAMUSCULAR | Status: DC

## 2024-04-21 MED ORDER — CLOPIDOGREL BISULFATE 75 MG PO TABS: 75.0000 mg | ORAL_TABLET | Freq: Every day | ORAL | 0 refills | Status: AC

## 2024-04-21 MED ORDER — ASPIRIN 81 MG PO TBEC: 81.0000 mg | DELAYED_RELEASE_TABLET | Freq: Every day | ORAL | 11 refills | Status: AC

## 2024-04-21 MED ORDER — ACETAMINOPHEN 325 MG PO TABS
650.0000 mg | ORAL_TABLET | Freq: Four times a day (QID) | ORAL | Status: DC | PRN
  Administered 2024-04-21: 650 mg via ORAL
  Filled 2024-04-21: qty 2

## 2024-04-21 NOTE — Discharge Instructions (Signed)
 1)Please take Aspirin  81 mg daily along with Plavix  75 mg daily for 21 days then after that STOP the Plavix   and continue ONLY Aspirin  81 mg daily indefinitely  2)Avoid ibuprofen /Advil /Aleve /Motrin Josefine Powders/Naproxen /BC powders/Meloxicam /Diclofenac/Indomethacin and other Nonsteroidal anti-inflammatory medications as these will make you more likely to bleed and can cause stomach ulcers, can also cause Kidney problems.   3) complete abstinence from nicotine /tobacco strongly advised  4) continue outpatient physical therapy  5) outpatient follow-up with neurologist as previously advised

## 2024-04-21 NOTE — Progress Notes (Signed)
*  PRELIMINARY RESULTS* Echocardiogram Limited 2-D Echocardiogram has been performed.  Teresa Aida PARAS 04/21/2024, 9:11 AM

## 2024-04-21 NOTE — Evaluation (Signed)
 Physical Therapy Evaluation Patient Details Name: [name] MRN: [id number] DOB: 1970-07-09 Today's Date: 04/21/2024  History of Present Illness  [name] is a 54 y.o. female with medical history significant for stroke, CHF, cocaine use, hypothyroidism, tardive dyskinesia.  Patient presented to the ED with complaints of generalized weakness, vomiting and chest pain that started today.  She reports she vomited about 6 times before she presented to the ED.  No diarrhea.  No abdominal pain.  She also reports she checked her weight, and 12 hours later she had gained 5 pounds.  No abdominal bloating no lower extremity swelling.  On arrival to the ED, patient was having difficulty speaking and her right side was weaker than her left.  Family at bedside said patient was trying to talk but was unable, and subsequently she was able to speak but speech was abnormal. She has a history of stroke-presented with abnormal speech, this resolved, with no residual deficits.  Also reported that patient was weak and unable to walk.  On my evaluation, patient speech is still abnormal per family but family reports it is better.   Clinical Impression  Patient functioning near baseline for functional mobility and gait other than requiring increased time with slightly labored movement for completing functional tasks and ambulating in room and hallway without loss of balance, but limited mostly due to fatigue. Patient tolerated sitting up at bedside after therapy with her son present. PLAN:  Patient to be discharged home today and discharged from acute physical therapy to care of nursing for ambulation as tolerated for length of stay with recommendations stated below           If plan is discharge home, recommend the following: A little help with walking and/or transfers;Help with stairs or ramp for entrance;Assistance with cooking/housework;Assist for transportation;A little help with bathing/dressing/bathroom    Can travel by private vehicle        Equipment Recommendations None recommended by PT  Recommendations for Other Services       Functional Status Assessment Patient has had a recent decline in their functional status and demonstrates the ability to make significant improvements in function in a reasonable and predictable amount of time.     Precautions / Restrictions Precautions Precautions: Fall Recall of Precautions/Restrictions: Intact Restrictions Weight Bearing Restrictions Per Provider Order: No      Mobility  Bed Mobility Overal bed mobility: Independent                  Transfers Overall transfer level: Modified independent                 General transfer comment: slightly increased time    Ambulation/Gait Ambulation/Gait assistance: Modified independent (Device/Increase time), Supervision Gait Distance (Feet): 65 Feet Assistive device: None Gait Pattern/deviations: WFL(Within Functional Limits) Gait velocity: decreased     General Gait Details: grossly WFL with slow slightly labored movement without loss of balance, limited mostly due to fatigue and c/o HA  Stairs            Wheelchair Mobility     Tilt Bed    Modified Rankin (Stroke Patients Only)       Balance Overall balance assessment: Mild deficits observed, not formally tested  Pertinent Vitals/Pain Pain Assessment Pain Assessment: 0-10 Pain Score: 6  Pain Location: frontal headache Pain Descriptors / Indicators: Headache Pain Intervention(s): Monitored during session    Home Living Family/patient expects to be discharged to:: Private residence Living Arrangements: Children Available Help at Discharge: Family;Available PRN/intermittently Type of Home: Mobile home Home Access: Stairs to enter   Entrance Stairs-Number of Steps: 3   Home Layout: One level Home Equipment: Cane - quad      Prior  Function Prior Level of Function : Independent/Modified Independent               ADLs Comments: Patient does not drive, family assists with community mobility and groceries.     Extremity/Trunk Assessment   Upper Extremity Assessment Upper Extremity Assessment: Overall WFL for tasks assessed    Lower Extremity Assessment Lower Extremity Assessment: Generalized weakness    Cervical / Trunk Assessment Cervical / Trunk Assessment: Normal  Communication   Communication Communication: No apparent difficulties    Cognition Arousal: Alert Behavior During Therapy: WFL for tasks assessed/performed   PT - Cognitive impairments: No apparent impairments                         Following commands: Intact       Cueing Cueing Techniques: Verbal cues, Tactile cues     General Comments      Exercises     Assessment/Plan    PT Assessment All further PT needs can be met in the next venue of care  PT Problem List Decreased strength;Decreased activity tolerance;Decreased balance;Decreased mobility       PT Treatment Interventions      PT Goals (Current goals can be found in the Care Plan section)  Acute Rehab PT Goals Patient Stated Goal: return home with family to assist PT Goal Formulation: With patient/family Time For Goal Achievement: 04/21/24 Potential to Achieve Goals: Good    Frequency       Co-evaluation               AM-PAC PT 6 Clicks Mobility  Outcome Measure Help needed turning from your back to your side while in a flat bed without using bedrails?: None Help needed moving from lying on your back to sitting on the side of a flat bed without using bedrails?: None Help needed moving to and from a bed to a chair (including a wheelchair)?: A Little Help needed standing up from a chair using your arms (e.g., wheelchair or bedside chair)?: None Help needed to walk in hospital room?: A Little Help needed climbing 3-5 steps with a railing?  : A Little 6 Click Score: 21    End of Session   Activity Tolerance: Patient tolerated treatment well;Patient limited by fatigue Patient left: in bed;with call bell/phone within reach;with family/visitor present Nurse Communication: Mobility status PT Visit Diagnosis: Unsteadiness on feet (R26.81);Other abnormalities of gait and mobility (R26.89);Muscle weakness (generalized) (M62.81)    Time: 9181-9157 PT Time Calculation (min) (ACUTE ONLY): 24 min   Charges:   PT Evaluation $PT Eval Moderate Complexity: 1 Mod PT Treatments $Therapeutic Activity: 23-37 mins PT General Charges $$ ACUTE PT VISIT: 1 Visit         11:42 AM, 04/21/24 Lynwood Music, MPT Physical Therapist with [facility] [address] [phone] office [phone] mobile phone

## 2024-04-21 NOTE — Progress Notes (Signed)
" °   04/21/24 0141  Assess: MEWS Score  Temp 98 F (36.7 C)  BP (!) 77/43  MAP (mmHg) (!) 54  Pulse Rate 64  Level of Consciousness Alert  SpO2 94 %  O2 Device Room Air  Assess: MEWS Score  MEWS Temp 0  MEWS Systolic 2  MEWS Pulse 0  MEWS RR 0  MEWS LOC 0  MEWS Score 2  MEWS Score Color Yellow  Assess: if the MEWS score is Yellow or Red  Were vital signs accurate and taken at a resting state? Yes  Does the patient meet 2 or more of the SIRS criteria? No  MEWS guidelines implemented  No, previously yellow, continue vital signs every 4 hours  Notify: Charge Nurse/RN  Name of Charge Nurse/RN Notified Harlene Griffin RN  Provider Notification  Provider Name/Title NP Lynwood Kipper  Date Provider Notified 04/21/24  Time Provider Notified [phone]  Method of Notification  (Secure Chat)  Notification Reason Change in status (Yellow [place])  Provider response Other (Comment) (See note by Lynwood Kipper NP)  Date of Provider Response 04/21/24  Time of Provider Response 0159  Assess: SIRS CRITERIA  SIRS Temperature  0  SIRS Respirations  0  SIRS Pulse 0  SIRS WBC 0  SIRS Score Sum  0    "

## 2024-04-21 NOTE — Discharge Summary (Signed)
 "                                                                                 [name], is a 54 y.o. female  DOB 1970-05-09  MRN [id number].  Admission date:  04/20/2024  Admitting Physician  Tully FORBES Carwin, MD  Discharge Date:  04/21/2024   Primary MD  Shona Norleen PEDLAR, MD  Recommendations for primary care physician for things to follow:  1)Please take Aspirin  81 mg daily along with Plavix  75 mg daily for 21 days then after that STOP the Plavix   and continue ONLY Aspirin  81 mg daily indefinitely  2)Avoid ibuprofen /Advil /Aleve /Motrin Josefine Powders/Naproxen /BC powders/Meloxicam /Diclofenac/Indomethacin and other Nonsteroidal anti-inflammatory medications as these will make you more likely to bleed and can cause stomach ulcers, can also cause Kidney problems.   3) complete abstinence from nicotine /tobacco strongly advised  4) continue outpatient physical therapy  5) outpatient follow-up with neurologist as previously advised  Admission Diagnosis  Weakness [R53.1] Stroke-like symptoms [R29.90]   Discharge Diagnosis  Weakness [R53.1] Stroke-like symptoms [R29.90]    Principal Problem:   Stroke-like symptoms Active Problems:   Postsurgical hypothyroidism   Stroke determined by clinical assessment (HCC)   Cocaine abuse (HCC)      Past Medical History:  Diagnosis Date   Anxiety    CHF (congestive heart failure) (HCC)    Chronic back pain    Chronic neck pain    Complication of anesthesia    pt states her O2 saturation drops after surgery   GERD (gastroesophageal reflux disease)    Headache    migraines- last one 2 months ago   Hypercholesteremia    Hypothyroidism    Kidney stones    Osteoporosis    Reflex sympathetic dystrophy    Stroke (HCC)    Tardive dyskinesia    Thyroid  disease     Past Surgical History:  Procedure Laterality Date   ABDOMINAL HYSTERECTOMY     partial   APPENDECTOMY     BACK SURGERY     plates and screws   BREAST SURGERY Left     fibrocystic tumor   CARPAL TUNNEL RELEASE Right 03/22/2019   Procedure: RIGHT CARPAL TUNNEL RELEASE;  Surgeon: Murrell Drivers, MD;  Location: [facility];  Service: Orthopedics;  Laterality: Right;  NO LOCAL   CHOLECYSTECTOMY     CYST EXCISION Left    foot   EAR CYST EXCISION Right 01/27/2013   Procedure: EXCISION CYST ON SCALP ;  Surgeon: Oneil DELENA Budge, MD;  Location: [facility];  Service: General;  Laterality: Right;   EAR CYST EXCISION Right 01/27/2013   Procedure: CYST REMOVAL right axilla;  Surgeon: Oneil DELENA Budge, MD;  Location: [facility];  Service: General;  Laterality: Right;   KNEE ARTHROSCOPY Left    NECK SURGERY     plate in neck   SALPINGOOPHORECTOMY Right    STRABISMUS SURGERY Bilateral    THYROIDECTOMY N/A 03/14/2014   Procedure: TOTAL THYROIDECTOMY;  Surgeon: Oneil DELENA Budge, MD;  Location: [facility];  Service: General;  Laterality: N/A;   TRANSESOPHAGEAL ECHOCARDIOGRAM (CATH LAB) N/A 12/31/2023   Procedure: TRANSESOPHAGEAL ECHOCARDIOGRAM;  Surgeon: Jeffrie Oneil BROCKS, MD;  Location: [facility];  Service: Cardiovascular;  Laterality: N/A;     HPI  from the history and physical done on the day of admission:   Chief Complaint: Vomiting,Weakness   HPI: [name] is a 54 y.o. female with medical history significant for stroke, CHF, cocaine use, hypothyroidism, tardive dyskinesia. Patient presented to the ED with complaints of generalized weakness, vomiting and chest pain that started today.  She reports she vomited about 6 times before she presented to the ED.  No diarrhea.  No abdominal pain.  She also reports she checked her weight, and 12 hours later she had gained 5 pounds.  No abdominal bloating no lower extremity swelling. On arrival to the ED, patient was having difficulty speaking and her right side was weaker than her left.  Family at bedside said patient was trying to talk but was unable, and subsequently she was able to speak but speech was abnormal. She  has a history of stroke-presented with abnormal speech, this resolved, with no residual deficits.  Also reported that patient was weak and unable to walk. On my evaluation, patient speech is still abnormal per family but family reports it is better.   ED Course: Code stroke called on arrival to the ED. Temperature 97.4.  Heart rate 50s to 78.  Respiratory rate 13-23.  Blood pressure systolic 93-128. Troponin <6 X 2.  proBNP 51.3. UA not suggestive of UTI. UDS pending. Code stroke called on arrival to the ED, head CT no acute abnormality, remote infarct left frontal periventricular white matter similar to prior. CTA head and neck-no large vessel occlusion.   Evaluated by teleneurologist Dr. Johney for functional component, however given recent stroke history and risk factors recommended admission for stroke workup to include MRI brain.   Review of Systems: As per HPI all other systems reviewed and negative.     Hospital Course:   Assessment and Plan: 1)Strokelike symptoms-abnormal speech started on arrival to ED, reported weakness R> L.  -No focal deficits on my evaluation, good and equal strength of bilateral upper and lower extremity.  Previously Admitted for left MCA small cortical infarcts and was given TNK 12/28/2023, presented with mild dysarthria at that time. -Evaluated by teleneurologist Dr. Jerri this admission presentation concerning for functional component.   - MRI brain negative for acute abnormality - Limited echocardiogram from 04/21/2024 showed preserved EF of 60 to 65%, no LVH --LDL is 43, HDL is 57, triglycerides 95 --A1c 5.3 - Physical therapy eval appreciated - Discharged on aspirin  81 mg daily along with Plavix  75 mg daily for 21 days then after that STOP the Plavix   and continue ONLY Aspirin  81 mg daily indefinitely--for secondary stroke Prevention (Per The multicenter SAMMPRIS trial) C/n Crestor   Chronic congestive heart failure-appears stable and compensated.   proBNP 51.3.  Chest x-ray clear.  TEE 12/31/2023 showed EF of 45 to 50%.   -Continue Aldactone  - Repeat limited echo as noted above   History of cocaine use/polysubstance abuse - UDS with cocaine and benzos -Patient is not interested in drug rehab programs at this time   Stroke history- 12/2023. received TNK.  No residual deficits -Aspirin , Plavix  and Crestor  for secondary prevention as above -Teleneurology consult appreciated this admission   Hypothyroidism -c/n Levothyroxine   Depression/Anxiety/Insomnia--continue Effexor  and Trazodone   Discharge Condition: stable  Follow UP   Follow-up Information     Shona Norleen PEDLAR, MD. Schedule an appointment as soon as possible for a visit in 1 month(s).  Specialty: Internal Medicine Contact information: [address] [place] [address] [phone]                 Consults obtained -teleneurology consult  Diet and Activity recommendation:  As advised  Discharge Instructions    Discharge Instructions     Ambulatory referral to Physical Therapy   Complete by: As directed    Iontophoresis - 4 mg/ml of dexamethasone : Yes   T.E.N.S. Unit Evaluation and Dispense as Indicated: Yes   Call MD for:  difficulty breathing, headache or visual disturbances   Complete by: As directed    Call MD for:  persistant dizziness or light-headedness   Complete by: As directed    Call MD for:  persistant nausea and vomiting   Complete by: As directed    Call MD for:  temperature >100.4   Complete by: As directed    Diet - low sodium heart healthy   Complete by: As directed    Discharge instructions   Complete by: As directed    1)Please take Aspirin  81 mg daily along with Plavix  75 mg daily for 21 days then after that STOP the Plavix   and continue ONLY Aspirin  81 mg daily indefinitely  2)Avoid ibuprofen /Advil /Aleve /Motrin Josefine Powders/Naproxen /BC powders/Meloxicam /Diclofenac/Indomethacin and other Nonsteroidal anti-inflammatory  medications as these will make you more likely to bleed and can cause stomach ulcers, can also cause Kidney problems.   3) complete abstinence from nicotine /tobacco strongly advised  4) continue outpatient physical therapy  5) outpatient follow-up with neurologist as previously advised   Increase activity slowly   Complete by: As directed         Discharge Medications     Allergies as of 04/21/2024       Reactions   Darvocet [propoxyphene N-acetaminophen ] Hives   Lyrica [pregabalin] Swelling        Medication List     TAKE these medications    acetaminophen  325 MG tablet Commonly known as: TYLENOL  Take 2 tablets (650 mg total) by mouth every 6 (six) hours as needed for headache or mild pain (pain score 1-3).   ALPRAZolam  1 MG tablet Commonly known as: XANAX  Take 1 mg by mouth 4 (four) times daily.   aspirin  EC 81 MG tablet Take 1 tablet (81 mg total) by mouth daily with breakfast. Swallow whole. What changed: when to take this   baclofen  10 MG tablet Commonly known as: LIORESAL  Take 10 mg by mouth 3 (three) times daily as needed for muscle spasms.   citalopram  40 MG tablet Commonly known as: CELEXA  Take 40 mg by mouth daily.   clopidogrel  75 MG tablet Commonly known as: Plavix  Take 1 tablet (75 mg total) by mouth daily. Please take Aspirin  81 mg daily along with Plavix  75 mg daily for 21 days then after that STOP the Plavix   and continue ONLY Aspirin  81 mg daily indefinitely   levothyroxine  75 MCG tablet Commonly known as: SYNTHROID  Take 1 tablet (75 mcg total) by mouth daily before breakfast.   rosuvastatin  20 MG tablet Commonly known as: CRESTOR  Take 1 tablet (20 mg total) by mouth daily.   spironolactone  25 MG tablet Commonly known as: Aldactone  Take 0.5 tablets (12.5 mg total) by mouth daily.   traZODone  50 MG tablet Commonly known as: DESYREL  Take 100 mg by mouth at bedtime.   venlafaxine  37.5 MG tablet Commonly known as: EFFEXOR  Take 37.5  mg by mouth 2 (two) times daily.       Major procedures and  Radiology Reports - PLEASE review detailed and final reports for all details, in brief -   ECHOCARDIOGRAM LIMITED Result Date: 04/21/2024    ECHOCARDIOGRAM LIMITED REPORT   Patient Name:   [name] Date of Exam: 04/21/2024 Medical Rec #:  [id number]        Height:       69.5 in Accession #:    [id number]       Weight:       188.0 lb Date of Birth:  1970/08/10        BSA:          2.022 m Patient Age:    53 years         BP:           109/60 mmHg Patient Gender: F                HR:           64 bpm. Exam Location:  [facility] Procedure: Limited Echo (Both Spectral and Color Flow Doppler were utilized            during procedure). Indications:    TIA G45.9  History:        Patient has prior history of Echocardiogram examinations, most                 recent 12/31/2023. CHF; Risk Factors:Dyslipidemia and Current                 Smoker. Hx of Cocaine abuse (HCC).  Sonographer:    Aida Pizza RCS Referring Phys: [phone] EJIROGHENE E Dereona Kolodny IMPRESSIONS  1. Left ventricular ejection fraction, by estimation, is 60 to 65%. The left ventricle has normal function.  2. Right ventricular systolic function is normal. The right ventricular size is normal.  3. The aortic valve is tricuspid.  4. Limited echo FINDINGS  Left Ventricle: Left ventricular ejection fraction, by estimation, is 60 to 65%. The left ventricle has normal function. The left ventricular internal cavity size was normal in size. There is no left ventricular hypertrophy. Right Ventricle: The right ventricular size is normal. Right vetricular wall thickness was not well visualized. Right ventricular systolic function is normal. Aortic Valve: The aortic valve is tricuspid. LEFT VENTRICLE PLAX 2D LVIDd:         4.90 cm LVIDs:         2.80 cm LV PW:         1.00 cm LV IVS:        0.90 cm LVOT diam:     1.90 cm LVOT Area:     2.84 cm  LEFT ATRIUM         Index LA diam:    3.20 cm 1.58 cm/m    AORTA Ao Root diam: 3.10 cm  SHUNTS Systemic Diam: 1.90 cm Dorn Ross MD Electronically signed by Dorn Ross MD Signature Date/Time: 04/21/2024/11:45:17 AM    Final    MR BRAIN WO CONTRAST Result Date: 04/20/2024 EXAM: MRI Brain Without Contrast 04/20/2024 06:08:27 PM TECHNIQUE: Multiplanar multisequence MRI of the head/brain was performed without the administration of intravenous contrast. COMPARISON: MRI head 12/29/2023. CLINICAL HISTORY: Neuro deficit, acute, stroke suspected FINDINGS: BRAIN AND VENTRICLES: No acute infarct. No intracranial hemorrhage. No mass. No midline shift. No hydrocephalus. The sella is unremarkable. Normal flow voids. Patchy T2 hyperintensities in the white matter are nonspecific but compatible with chronic microvascular ischemic disease, greatest in the left frontal subcortical white matter.chang ORBITS: No significant abnormality.  SINUSES AND MASTOIDS: No significant abnormality. BONES AND SOFT TISSUES: Normal marrow signal. No soft tissue abnormality. IMPRESSION: 1. No acute abnormality. 2. Similar chronic microvascular ischemic change. Electronically signed by: Glendia Molt MD 04/20/2024 07:03 PM EST RP Workstation: [id number]   CT ANGIO HEAD NECK W WO CM W PERF (CODE STROKE) Result Date: 04/20/2024 EXAM: CTA Head and Neck with Perfusion 04/20/2024 04:09:07 PM TECHNIQUE: CTA of the head and neck was performed with the administration of intravenous contrast. 3D postprocessing with multiplanar reconstructions and MIPs was performed to evaluate the vascular anatomy. Cerebral perfusion analysis using computed tomography with contrast administration, including post-processing of parametric maps with determination of cerebral blood flow, cerebral blood volume, mean transit time and time-to-maximum. Automated exposure control, iterative reconstruction, and/or weight based adjustment of the mA/kV was utilized to reduce the radiation dose to as low as reasonably achievable.  COMPARISON: Earlier same day head CT CLINICAL HISTORY: Neuro deficit, acute, stroke suspected FINDINGS: CTA NECK: AORTIC ARCH AND ARCH VESSELS: No dissection or arterial injury. No significant stenosis of the brachiocephalic or subclavian arteries. CERVICAL CAROTID ARTERIES: No dissection, arterial injury, or hemodynamically significant stenosis by NASCET criteria. CERVICAL VERTEBRAL ARTERIES: Focal atherosclerosis along the V1 segment of the left vertebral artery without significant stenosis. No dissection or arterial injury. LUNGS AND MEDIASTINUM: There are areas of paraseptal and centrilobular emphysema within the lung apices. SOFT TISSUES: No acute abnormality. BONES: Anterior cervical fusion hardware at C5-C6. Mild degenerative changes in the visualized spine. Edentulous maxilla and mandible. CTA HEAD: ANTERIOR CIRCULATION: No significant stenosis of the internal carotid arteries. No significant stenosis of the anterior cerebral arteries. No significant stenosis of the middle cerebral arteries. No aneurysm. POSTERIOR CIRCULATION: No significant stenosis of the posterior cerebral arteries. Fetal origin of the left PCA. No significant stenosis of the basilar artery. No significant stenosis of the vertebral arteries. No aneurysm. OTHER: No dural venous sinus thrombosis on this non-dedicated study. CT PERFUSION: EXAM QUALITY: Exam quality is adequate with diagnostic perfusion maps. No significant motion artifact. Appropriate arterial inflow and venous outflow curves. CORE INFARCT (CBF<30% volume): 0 mL TOTAL HYPOPERFUSION (Tmax>6s volume): 0 mL PENUMBRA: Mismatch volume: 0 mL Mismatch ratio: not applicable Location: not applicable IMPRESSION: 1. No acute large vessel occlusion. 2. No hemodynamically significant stenosis or aneurysm of the arteries in the head or neck. 3. No evidence of ischemia by CT brain perfusion. 4. Pulmonary emphysema is an independent risk factor for lung cancer. Recommend consideration for  evaluation for a low-dose CT lung cancer screening program. Electronically signed by: Donnice Mania MD 04/20/2024 04:29 PM EST RP Workstation: [id number]   CT HEAD CODE STROKE WO CONTRAST (LKW 0-4.5h, LVO 0-24h) Result Date: 04/20/2024 EXAM: CT HEAD WITHOUT CONTRAST 04/20/2024 03:37:38 PM TECHNIQUE: CT of the head was performed without the administration of intravenous contrast. Automated exposure control, iterative reconstruction, and/or weight based adjustment of the mA/kV was utilized to reduce the radiation dose to as low as reasonably achievable. COMPARISON: 12/29/2023 CLINICAL HISTORY: Neuro deficit, acute, stroke suspected. FINDINGS: BRAIN AND VENTRICLES: No acute hemorrhage. No evidence of acute infarct. No hydrocephalus. No extra-axial collection. No mass effect or midline shift. Similar appearance of mild chronic microvascular ischemic changes remote infarct in the left frontal periventricular white matter similar to prior. [place] stroke program early CT (ASPECT) score: Ganglionic (caudate, IC, lentiform nucleus, insula, M1-M3): 7. Supraganglionic (M4-M6): 3. Total: 10. ORBITS: No acute abnormality. SINUSES: No acute abnormality. SOFT TISSUES AND SKULL: No acute soft tissue abnormality. No skull fracture. IMPRESSION:  1. No acute intracranial abnormality. 2. Mild chronic microvascular ischemic changes and remote infarct in the left frontal periventricular white matter, similar to prior. 3. ASPECTS 10. 4. Findings discussed by telephone with Dr. Dean at 4:38 PM on 04/20/24. Electronically signed by: Donnice Mania MD 04/20/2024 03:49 PM EST RP Workstation: [id number]   DG Chest 2 View Result Date: 04/20/2024 CLINICAL DATA:  Chest pain EXAM: CHEST - 2 VIEW COMPARISON:  December 17, 2012 FINDINGS: The heart size and mediastinal contours are within normal limits. Minimal bibasilar subsegmental atelectasis is noted. The visualized skeletal structures are unremarkable. IMPRESSION: Minimal bibasilar  subsegmental atelectasis. Electronically Signed   By: Lynwood Landy Raddle M.D.   On: 04/20/2024 14:16   Today   Subjective    [name] today has no new concerns .  Ambulated with physical therapist, no dizziness or palpitations -No new speech concerns, she is  eating and drinking well No fever  Or chills        Patient has been seen and examined prior to discharge   Objective   Blood pressure (!) 102/52, pulse 60, temperature 98.2 F (36.8 C), temperature source Oral, resp. rate 17, height 5' 9.5 (1.765 m), weight 85.3 kg, SpO2 97%.  No intake or output data in the 24 hours ending 04/21/24 1244  Exam Gen:- Awake Alert, no acute distress  HEENT:- [place].AT, No sclera icterus Neck-Supple Neck,No JVD,.  Lungs-  CTAB , good air movement bilaterally CV- S1, S2 normal, regular Abd-  +ve B.Sounds, Abd Soft, No tenderness,    Extremity/Skin:- No  edema,   good pulses Psych-affect is appropriate, oriented x3 Neuro-no new focal deficits, no tremors, no further speech concerns   Data Review   CBC w Diff:  Lab Results  Component Value Date   WBC 6.5 04/20/2024   HGB 12.6 04/20/2024   HCT 37.0 04/20/2024   PLT 204 04/20/2024   LYMPHOPCT 39 04/20/2024   MONOPCT 7 04/20/2024   EOSPCT 3 04/20/2024   BASOPCT 1 04/20/2024   CMP:  Lab Results  Component Value Date   NA 140 04/20/2024   K 4.0 04/20/2024   CL 103 04/20/2024   CO2 24 04/20/2024   BUN 17 04/20/2024   CREATININE 1.00 04/20/2024   PROT 6.9 04/20/2024   ALBUMIN 4.3 04/20/2024   BILITOT 0.2 04/20/2024   ALKPHOS 111 04/20/2024   AST 25 04/20/2024   ALT 21 04/20/2024   Total Discharge time is about 33 minutes  Rendall Carwin M.D on 04/21/2024 at 12:44 PM  Go to [email or web address] -  for contact info  [facility] - Office  [phone]   "

## 2024-04-21 NOTE — Plan of Care (Signed)
   Problem: Education: Goal: Knowledge of General Education information will improve Description: Including pain rating scale, medication(s)/side effects and non-pharmacologic comfort measures Outcome: Progressing   Problem: Activity: Goal: Risk for activity intolerance will decrease Outcome: Progressing   Problem: Nutrition: Goal: Adequate nutrition will be maintained Outcome: Progressing

## 2024-04-21 NOTE — Progress Notes (Signed)
" °  Transition of Care (TOC) Screening Note   Patient Details  Name: JAHNIA HEWES Date of Birth: [birth year]   Transition of [facility] CM/SW Contact:    Hoy DELENA Bigness, LCSW Phone Number: 04/21/2024, 10:13 AM    Transition of Care Department [facility]) has reviewed patient and no TOC needs have been identified at this time. We will continue to monitor patient advancement through interdisciplinary progression rounds. If new patient transition needs arise, please place a TOC consult.    04/21/24 1013  TOC Brief Assessment  Insurance and Status Reviewed  Patient has primary care physician Yes  Home environment has been reviewed From home  Prior level of function: Independent  Prior/Current Home Services No current home services (Receives OPPT at [facility])  Social Drivers of Health Review SDOH reviewed no interventions necessary  Readmission risk has been reviewed Yes  Transition of care needs no transition of care needs at this time    "

## 2024-04-21 NOTE — Progress Notes (Signed)
 SLP Cancellation Note  Patient Details Name: [name] MRN: [id number] DOB: 30-Dec-1970   Cancelled treatment:       Reason Eval/Treat Not Completed: SLP screened, no needs identified, will sign off; MRI negative;pt stated her symptoms had primarily resolved regarding speech fluency and returned to baseline.   Pat Berlin Viereck,M.S.,CCC-SLP 04/21/2024, 1:28 PM

## 2024-04-21 NOTE — Progress Notes (Addendum)
 "       Overnight   NAME: [name] MRN: [id number] DOB : 05-May-1970    Date of Service   04/21/2024   HPI/Events of Note    Notified by RN for continual low BP, and change in behavior.  Brief history 54 year old female history of stroke, CHF, cocaine use, hypothyroidism, tardive dyskinesia. Patient admitted through ER with complaints of general weakness, vomiting, and chest pain that started yesterday. Admitted through the ER with original noted presentation of difficulty speaking, and her right side was weaker than her left.  Family was reportedly at bedside at that time stating patient was trying to talk but unable, and that afterwards she was able to speak but abnormally. She reportedly has a history of stroke presented with abnormal speech which resolved with no residual deficits. Also reportedly patient was unable to walk.  A code stroke was called on arrival to the ER at which time head CT was done with no acute abnormality, CTA head and neck with no large vessel occlusion. Patient was evaluated by teleneurologist Dr. Jerri. Admitted to telemetry floor at [facility]  Bedside visit  Initial inquiry by RN was for nausea-treated previously in ER with Zofran .  Apparently resolved then..  Preceding advisement by the RN to a family member and patient was for family member to potentially take possession of home medications as RN advised patient not to take any home meds without order from medical staff. Family member apparently did not take home meds for some reason or another, unknown. After that patient started to look different as noted above.  Current inquiry was about blood pressure and progression of every 2-hour NIH.  RN noticed something not right, less oriented and slurring , mouth movements resembling her tardive dyskinesia, patient was pale, at which time staff began to check vitals more and found them to be decreasing. A fluid bolus was ordered but no effect from  bolus . RN stated that on her assessment, patient appeared more sedate. She did remain awake and oriented x 4 but answers were slowed with delayed responses, resembling sedation. Patient is awake and oriented throughout although sluggish, she has ambulated to the bathroom with assistance.  Pupils originally were increased in size since prior NIH assessment bilaterally equal and reactive but sluggish.  They are improving as time goes. NIH progressively improving since original interaction.  Scoring was 11-9-6 Blood pressure has improved over time.  There is concern for patient taking home meds although she did not admit to any home medication use. Pt mentioned that she had her meds but was advised not to use these meds again.  Assigned nurse notified charge RN of concern. Nursing staff requested medication, patient was awake and alert and agreed. Patient did not admit to taking any medications. That was not questioned further due to concern for potential agitation. Medications are counted, tagged, and available in pharmacy with receipt in chart Tele-sitter in place for potential alternate medication use.  Noted potentially self-administered medications given to RN staff for count and holding in Pharmacy:  Trazodone   Effexor  Xanax   Lexapro Baclofen  Please see Pharmacy holding and container for exact medications/counts.    Interventions/ Plan   Continue all Attending previous orders Continue all specialist previous orders IV Fluid bolus  Maintain TeleSitter.       Update 0443 Hrs Patient more awake. Vitals have improved. Currently - BP 124/58   Lynwood Kipper BSN MSNA MSN ACNPC-AG Acute Care Nurse Practitioner [facility]  "

## 2024-05-03 ENCOUNTER — Encounter: Payer: Self-pay | Admitting: Cardiology

## 2024-05-03 ENCOUNTER — Ambulatory Visit: Admitting: Cardiology

## 2024-05-03 VITALS — BP 95/61 | HR 92 | Resp 16 | Ht 69.0 in | Wt 190.4 lb

## 2024-05-03 DIAGNOSIS — E785 Hyperlipidemia, unspecified: Secondary | ICD-10-CM | POA: Diagnosis not present

## 2024-05-03 DIAGNOSIS — Z8673 Personal history of transient ischemic attack (TIA), and cerebral infarction without residual deficits: Secondary | ICD-10-CM

## 2024-05-03 DIAGNOSIS — F1990 Other psychoactive substance use, unspecified, uncomplicated: Secondary | ICD-10-CM | POA: Diagnosis not present

## 2024-05-03 NOTE — Patient Instructions (Addendum)
 Medication Instructions:  Your physician recommends that you continue on your current medications as directed. Please refer to the Current Medication list given to you today.  *If you need a refill on your cardiac medications before your next appointment, please call your pharmacy*  Lab Work: Lab Orders  No laboratory test(s) ordered today    If you have labs (blood work) drawn today and your tests are completely normal, you will receive your results only by: MyChart Message (if you have MyChart) OR A paper copy in the mail If you have any lab test that is abnormal or we need to change your treatment, we will call you to review the results.  Follow-Up: At [facility], you and your health needs are our priority.  As part of our continuing mission to provide you with exceptional heart care, our providers are all part of one team.  This team includes your primary Cardiologist (physician) and Advanced Practice Providers or APPs (Physician Assistants and Nurse Practitioners) who all work together to provide you with the care you need, when you need it.  Your next appointment:    As Needed  Provider:   Gordy Bergamo, MD        We recommend signing up for the patient portal called MyChart.  Patients are able to view lab/test results, encounter notes, upcoming appointments, etc.  Non-urgent messages can be sent to your provider as well, go to [email or web address].

## 2024-05-07 ENCOUNTER — Other Ambulatory Visit (HOSPITAL_COMMUNITY): Payer: Self-pay

## 2024-05-07 DIAGNOSIS — Z1231 Encounter for screening mammogram for malignant neoplasm of breast: Secondary | ICD-10-CM

## 2024-05-27 ENCOUNTER — Ambulatory Visit (HOSPITAL_COMMUNITY)

## 2024-06-17 ENCOUNTER — Ambulatory Visit: Payer: Self-pay | Admitting: Physician Assistant
